# Patient Record
Sex: Female | Born: 1935
Health system: Southern US, Community
[De-identification: ages and names within clinical notes are randomized; demographics above are authoritative.]

## PROBLEM LIST (undated history)

## (undated) DIAGNOSIS — G20A1 Parkinson's disease without dyskinesia, without mention of fluctuations: Secondary | ICD-10-CM

## (undated) DIAGNOSIS — E785 Hyperlipidemia, unspecified: Secondary | ICD-10-CM

## (undated) DIAGNOSIS — H9190 Unspecified hearing loss, unspecified ear: Secondary | ICD-10-CM

## (undated) DIAGNOSIS — C801 Malignant (primary) neoplasm, unspecified: Secondary | ICD-10-CM

## (undated) DIAGNOSIS — I10 Essential (primary) hypertension: Secondary | ICD-10-CM

## (undated) DIAGNOSIS — A0472 Enterocolitis due to Clostridium difficile, not specified as recurrent: Secondary | ICD-10-CM

## (undated) DIAGNOSIS — E039 Hypothyroidism, unspecified: Secondary | ICD-10-CM

## (undated) DIAGNOSIS — Z860100 Personal history of colon polyps, unspecified: Secondary | ICD-10-CM

## (undated) DIAGNOSIS — K449 Diaphragmatic hernia without obstruction or gangrene: Secondary | ICD-10-CM

## (undated) DIAGNOSIS — K8020 Calculus of gallbladder without cholecystitis without obstruction: Principal | ICD-10-CM

## (undated) DIAGNOSIS — F329 Major depressive disorder, single episode, unspecified: Secondary | ICD-10-CM

## (undated) DIAGNOSIS — Z9289 Personal history of other medical treatment: Secondary | ICD-10-CM

## (undated) DIAGNOSIS — I4891 Unspecified atrial fibrillation: Secondary | ICD-10-CM

## (undated) DIAGNOSIS — K219 Gastro-esophageal reflux disease without esophagitis: Secondary | ICD-10-CM

## (undated) DIAGNOSIS — F419 Anxiety disorder, unspecified: Secondary | ICD-10-CM

## (undated) DIAGNOSIS — G20 Parkinson's disease: Secondary | ICD-10-CM

## (undated) DIAGNOSIS — D649 Anemia, unspecified: Secondary | ICD-10-CM

## (undated) HISTORY — DX: Personal history of colon polyps, unspecified: Z86.0100

## (undated) HISTORY — DX: Personal history of colonic polyps: Z86.010

## (undated) HISTORY — DX: Major depressive disorder, single episode, unspecified: F32.9

## (undated) HISTORY — DX: Anxiety disorder, unspecified: F41.9

## (undated) HISTORY — DX: Diaphragmatic hernia without obstruction or gangrene: K44.9

## (undated) HISTORY — DX: Hyperlipidemia, unspecified: E78.5

## (undated) HISTORY — DX: Gastro-esophageal reflux disease without esophagitis: K21.9

## (undated) HISTORY — DX: Hypothyroidism, unspecified: E03.9

## (undated) HISTORY — DX: Enterocolitis due to Clostridium difficile, not specified as recurrent: A04.72

## (undated) HISTORY — DX: Essential (primary) hypertension: I10

## (undated) HISTORY — DX: Parkinson's disease without dyskinesia, without mention of fluctuations: G20.A1

## (undated) HISTORY — DX: Anemia, unspecified: D64.9

## (undated) HISTORY — DX: Calculus of gallbladder without cholecystitis without obstruction: K80.20

## (undated) HISTORY — PX: EYE SURGERY: SHX253

## (undated) HISTORY — DX: Unspecified atrial fibrillation: I48.91

## (undated) HISTORY — PX: COLON SURGERY: SHX602

## (undated) HISTORY — DX: Parkinson's disease: G20

---
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
## 1957-03-18 HISTORY — PX: BACK SURGERY: SHX140

## 1967-03-19 HISTORY — PX: ABDOMINAL HYSTERECTOMY: SHX81

## 1993-03-18 HISTORY — PX: BREAST SURGERY: SHX581

## 1997-12-20 ENCOUNTER — Other Ambulatory Visit: Admission: RE | Admit: 1997-12-20 | Discharge: 1997-12-20 | Payer: Self-pay | Admitting: Family Medicine

## 1998-12-22 ENCOUNTER — Other Ambulatory Visit: Admission: RE | Admit: 1998-12-22 | Discharge: 1998-12-22 | Payer: Self-pay | Admitting: Family Medicine

## 1999-01-08 ENCOUNTER — Encounter: Payer: Self-pay | Admitting: Internal Medicine

## 1999-03-19 DIAGNOSIS — F32A Depression, unspecified: Secondary | ICD-10-CM

## 1999-03-19 HISTORY — DX: Depression, unspecified: F32.A

## 1999-05-10 ENCOUNTER — Other Ambulatory Visit: Admission: RE | Admit: 1999-05-10 | Discharge: 1999-05-10 | Payer: Self-pay | Admitting: Internal Medicine

## 1999-05-10 ENCOUNTER — Encounter (INDEPENDENT_AMBULATORY_CARE_PROVIDER_SITE_OTHER): Payer: Self-pay | Admitting: Specialist

## 1999-12-07 ENCOUNTER — Encounter: Admission: RE | Admit: 1999-12-07 | Discharge: 1999-12-07 | Payer: Self-pay | Admitting: Family Medicine

## 1999-12-07 ENCOUNTER — Encounter: Payer: Self-pay | Admitting: Family Medicine

## 2000-01-23 ENCOUNTER — Encounter: Admission: RE | Admit: 2000-01-23 | Discharge: 2000-01-23 | Payer: Self-pay | Admitting: Family Medicine

## 2000-01-23 ENCOUNTER — Encounter (INDEPENDENT_AMBULATORY_CARE_PROVIDER_SITE_OTHER): Payer: Self-pay | Admitting: *Deleted

## 2000-01-23 ENCOUNTER — Encounter: Payer: Self-pay | Admitting: Family Medicine

## 2000-02-05 ENCOUNTER — Other Ambulatory Visit: Admission: RE | Admit: 2000-02-05 | Discharge: 2000-02-05 | Payer: Self-pay | Admitting: Family Medicine

## 2000-12-08 ENCOUNTER — Encounter: Payer: Self-pay | Admitting: Family Medicine

## 2000-12-08 ENCOUNTER — Encounter: Admission: RE | Admit: 2000-12-08 | Discharge: 2000-12-08 | Payer: Self-pay | Admitting: Family Medicine

## 2000-12-12 ENCOUNTER — Encounter: Payer: Self-pay | Admitting: Family Medicine

## 2000-12-12 ENCOUNTER — Encounter: Admission: RE | Admit: 2000-12-12 | Discharge: 2000-12-12 | Payer: Self-pay | Admitting: Family Medicine

## 2001-02-05 ENCOUNTER — Other Ambulatory Visit: Admission: RE | Admit: 2001-02-05 | Discharge: 2001-02-05 | Payer: Self-pay | Admitting: Family Medicine

## 2001-03-02 ENCOUNTER — Encounter: Admission: RE | Admit: 2001-03-02 | Discharge: 2001-03-02 | Payer: Self-pay | Admitting: Urology

## 2001-03-02 ENCOUNTER — Encounter: Payer: Self-pay | Admitting: Urology

## 2001-03-04 ENCOUNTER — Ambulatory Visit (HOSPITAL_BASED_OUTPATIENT_CLINIC_OR_DEPARTMENT_OTHER): Admission: RE | Admit: 2001-03-04 | Discharge: 2001-03-04 | Payer: Self-pay | Admitting: Urology

## 2001-03-25 ENCOUNTER — Encounter (INDEPENDENT_AMBULATORY_CARE_PROVIDER_SITE_OTHER): Payer: Self-pay | Admitting: Specialist

## 2001-03-25 ENCOUNTER — Ambulatory Visit (HOSPITAL_BASED_OUTPATIENT_CLINIC_OR_DEPARTMENT_OTHER): Admission: RE | Admit: 2001-03-25 | Discharge: 2001-03-25 | Payer: Self-pay | Admitting: Urology

## 2001-06-25 DIAGNOSIS — Z86010 Personal history of colonic polyps: Secondary | ICD-10-CM

## 2001-06-25 DIAGNOSIS — Z860101 Personal history of adenomatous and serrated colon polyps: Secondary | ICD-10-CM

## 2001-06-25 HISTORY — DX: Personal history of colonic polyps: Z86.010

## 2001-06-25 HISTORY — DX: Personal history of adenomatous and serrated colon polyps: Z86.0101

## 2001-12-14 ENCOUNTER — Encounter: Admission: RE | Admit: 2001-12-14 | Discharge: 2001-12-14 | Payer: Self-pay | Admitting: Family Medicine

## 2001-12-14 ENCOUNTER — Encounter: Payer: Self-pay | Admitting: Family Medicine

## 2002-02-09 ENCOUNTER — Other Ambulatory Visit: Admission: RE | Admit: 2002-02-09 | Discharge: 2002-02-09 | Payer: Self-pay | Admitting: Family Medicine

## 2002-12-08 ENCOUNTER — Ambulatory Visit (HOSPITAL_COMMUNITY): Admission: RE | Admit: 2002-12-08 | Discharge: 2002-12-08 | Payer: Self-pay | Admitting: Neurology

## 2002-12-20 ENCOUNTER — Encounter: Payer: Self-pay | Admitting: Family Medicine

## 2002-12-20 ENCOUNTER — Encounter: Admission: RE | Admit: 2002-12-20 | Discharge: 2002-12-20 | Payer: Self-pay | Admitting: Family Medicine

## 2003-02-09 ENCOUNTER — Encounter: Admission: RE | Admit: 2003-02-09 | Discharge: 2003-02-09 | Payer: Self-pay | Admitting: Family Medicine

## 2003-02-28 ENCOUNTER — Encounter: Admission: RE | Admit: 2003-02-28 | Discharge: 2003-02-28 | Payer: Self-pay | Admitting: Family Medicine

## 2003-03-10 ENCOUNTER — Emergency Department (HOSPITAL_COMMUNITY): Admission: EM | Admit: 2003-03-10 | Discharge: 2003-03-10 | Payer: Self-pay | Admitting: Emergency Medicine

## 2003-03-19 HISTORY — PX: MASTECTOMY, PARTIAL: SHX709

## 2003-07-20 ENCOUNTER — Other Ambulatory Visit: Admission: RE | Admit: 2003-07-20 | Discharge: 2003-07-20 | Payer: Self-pay | Admitting: Family Medicine

## 2004-02-24 ENCOUNTER — Encounter: Admission: RE | Admit: 2004-02-24 | Discharge: 2004-02-24 | Payer: Self-pay | Admitting: Family Medicine

## 2004-08-02 ENCOUNTER — Other Ambulatory Visit: Admission: RE | Admit: 2004-08-02 | Discharge: 2004-08-02 | Payer: Self-pay | Admitting: Family Medicine

## 2005-02-24 ENCOUNTER — Inpatient Hospital Stay (HOSPITAL_COMMUNITY): Admission: EM | Admit: 2005-02-24 | Discharge: 2005-02-28 | Payer: Self-pay | Admitting: Emergency Medicine

## 2005-02-24 ENCOUNTER — Encounter (INDEPENDENT_AMBULATORY_CARE_PROVIDER_SITE_OTHER): Payer: Self-pay | Admitting: *Deleted

## 2005-02-25 ENCOUNTER — Ambulatory Visit: Payer: Self-pay | Admitting: Gastroenterology

## 2005-03-11 ENCOUNTER — Encounter (INDEPENDENT_AMBULATORY_CARE_PROVIDER_SITE_OTHER): Payer: Self-pay | Admitting: *Deleted

## 2005-03-19 ENCOUNTER — Ambulatory Visit: Payer: Self-pay | Admitting: Internal Medicine

## 2005-04-01 ENCOUNTER — Ambulatory Visit: Payer: Self-pay | Admitting: Internal Medicine

## 2005-04-02 ENCOUNTER — Encounter: Payer: Self-pay | Admitting: Internal Medicine

## 2005-04-03 ENCOUNTER — Encounter: Admission: RE | Admit: 2005-04-03 | Discharge: 2005-04-03 | Payer: Self-pay | Admitting: Family Medicine

## 2005-04-10 ENCOUNTER — Encounter (INDEPENDENT_AMBULATORY_CARE_PROVIDER_SITE_OTHER): Payer: Self-pay | Admitting: Specialist

## 2005-04-10 ENCOUNTER — Encounter (INDEPENDENT_AMBULATORY_CARE_PROVIDER_SITE_OTHER): Payer: Self-pay | Admitting: Diagnostic Radiology

## 2005-04-10 ENCOUNTER — Encounter: Admission: RE | Admit: 2005-04-10 | Discharge: 2005-04-10 | Payer: Self-pay | Admitting: Family Medicine

## 2005-04-26 ENCOUNTER — Encounter: Admission: RE | Admit: 2005-04-26 | Discharge: 2005-04-26 | Payer: Self-pay | Admitting: General Surgery

## 2005-05-08 ENCOUNTER — Encounter (INDEPENDENT_AMBULATORY_CARE_PROVIDER_SITE_OTHER): Payer: Self-pay | Admitting: *Deleted

## 2005-05-08 ENCOUNTER — Ambulatory Visit (HOSPITAL_COMMUNITY): Admission: RE | Admit: 2005-05-08 | Discharge: 2005-05-09 | Payer: Self-pay | Admitting: General Surgery

## 2005-05-16 ENCOUNTER — Ambulatory Visit: Payer: Self-pay | Admitting: Oncology

## 2005-06-21 ENCOUNTER — Encounter: Admission: RE | Admit: 2005-06-21 | Discharge: 2005-06-21 | Payer: Self-pay | Admitting: Oncology

## 2005-06-28 ENCOUNTER — Encounter: Admission: RE | Admit: 2005-06-28 | Discharge: 2005-06-28 | Payer: Self-pay | Admitting: Family Medicine

## 2005-08-28 ENCOUNTER — Ambulatory Visit: Payer: Self-pay | Admitting: Oncology

## 2006-02-26 ENCOUNTER — Ambulatory Visit: Payer: Self-pay | Admitting: Oncology

## 2006-02-28 LAB — CBC WITH DIFFERENTIAL/PLATELET
Basophils Absolute: 0 10*3/uL (ref 0.0–0.1)
EOS%: 0.8 % (ref 0.0–7.0)
Eosinophils Absolute: 0 10*3/uL (ref 0.0–0.5)
HCT: 38.4 % (ref 34.8–46.6)
HGB: 13 g/dL (ref 11.6–15.9)
MCH: 29.6 pg (ref 26.0–34.0)
MCV: 87.6 fL (ref 81.0–101.0)
NEUT%: 61.7 % (ref 39.6–76.8)
lymph#: 1.5 10*3/uL (ref 0.9–3.3)

## 2006-04-07 ENCOUNTER — Encounter: Admission: RE | Admit: 2006-04-07 | Discharge: 2006-04-07 | Payer: Self-pay | Admitting: Oncology

## 2006-05-26 IMAGING — CR DG CHEST 2V
2 series · 2 of 2 positions shown · non-contrast
Comparison: 03/10/03.

CLINICAL DATA: Preoperative respiratory exam for left breast cancer.  Hypertension.
 CHEST ? 2 VIEW:

[view not recorded (1 of 2)]
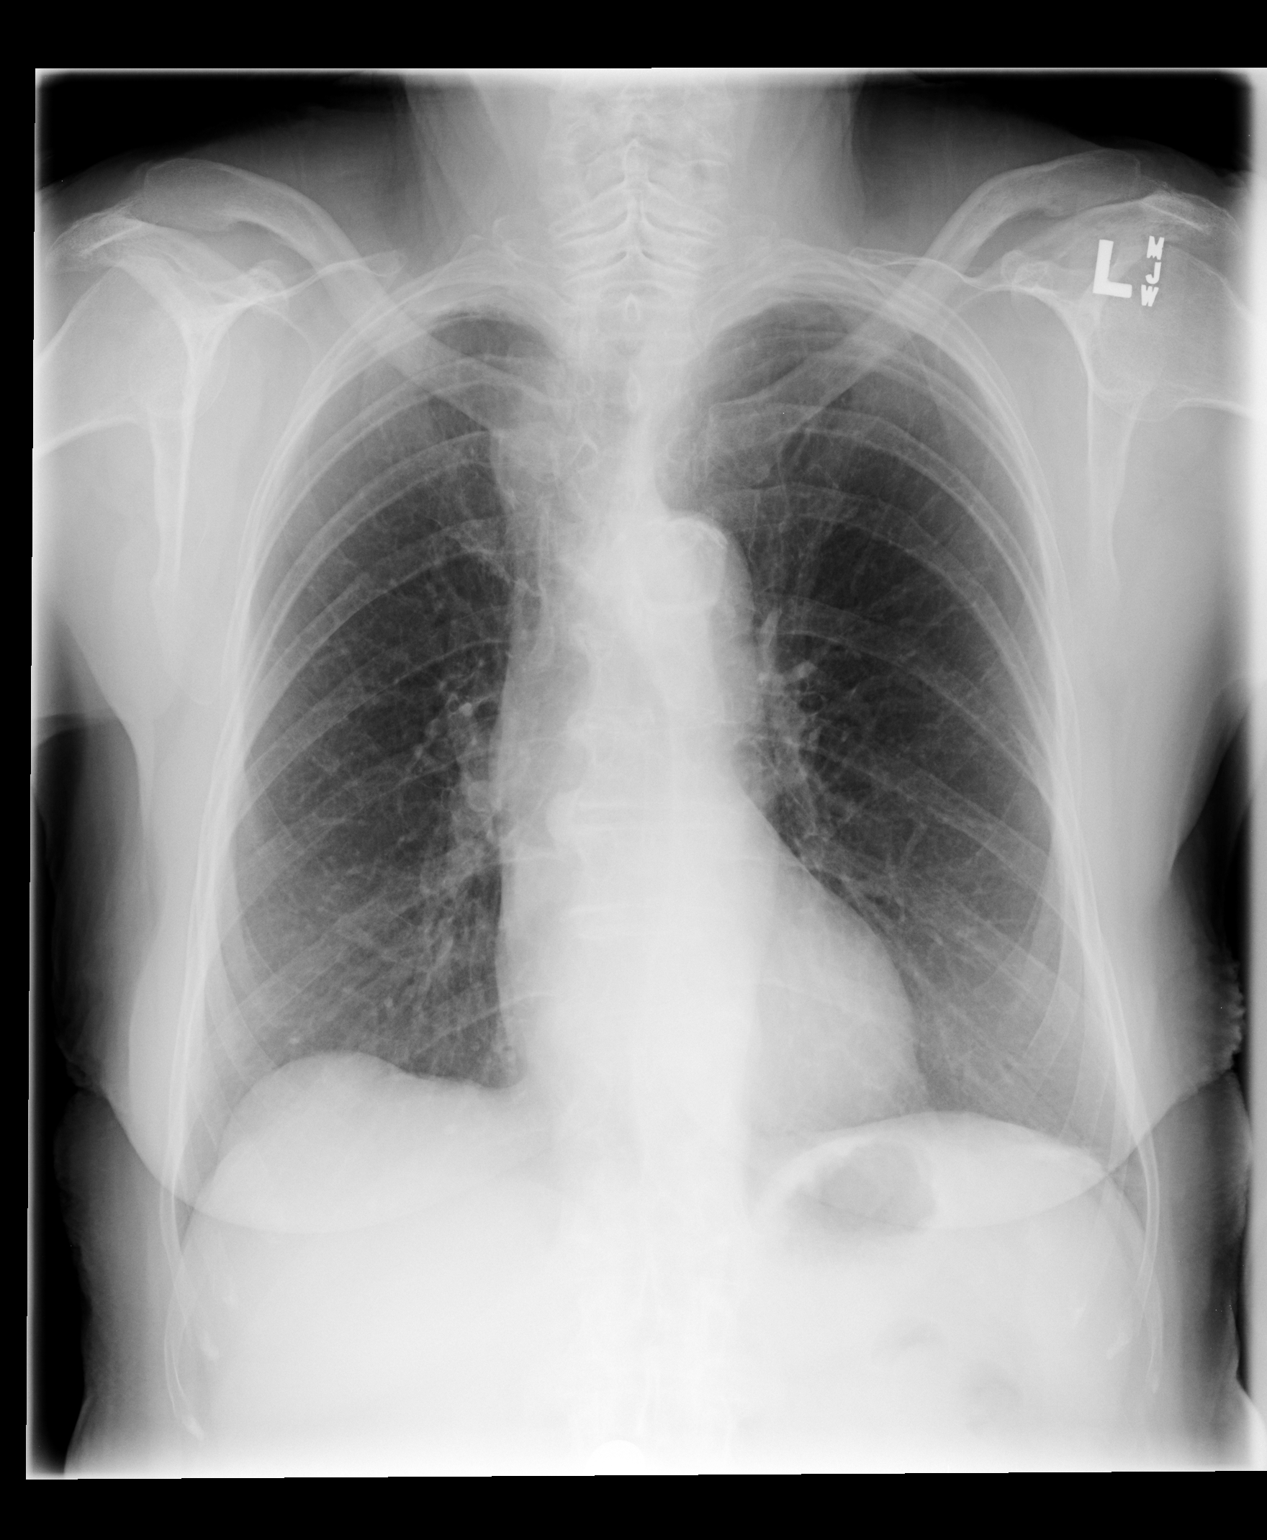

[view not recorded (2 of 2)]
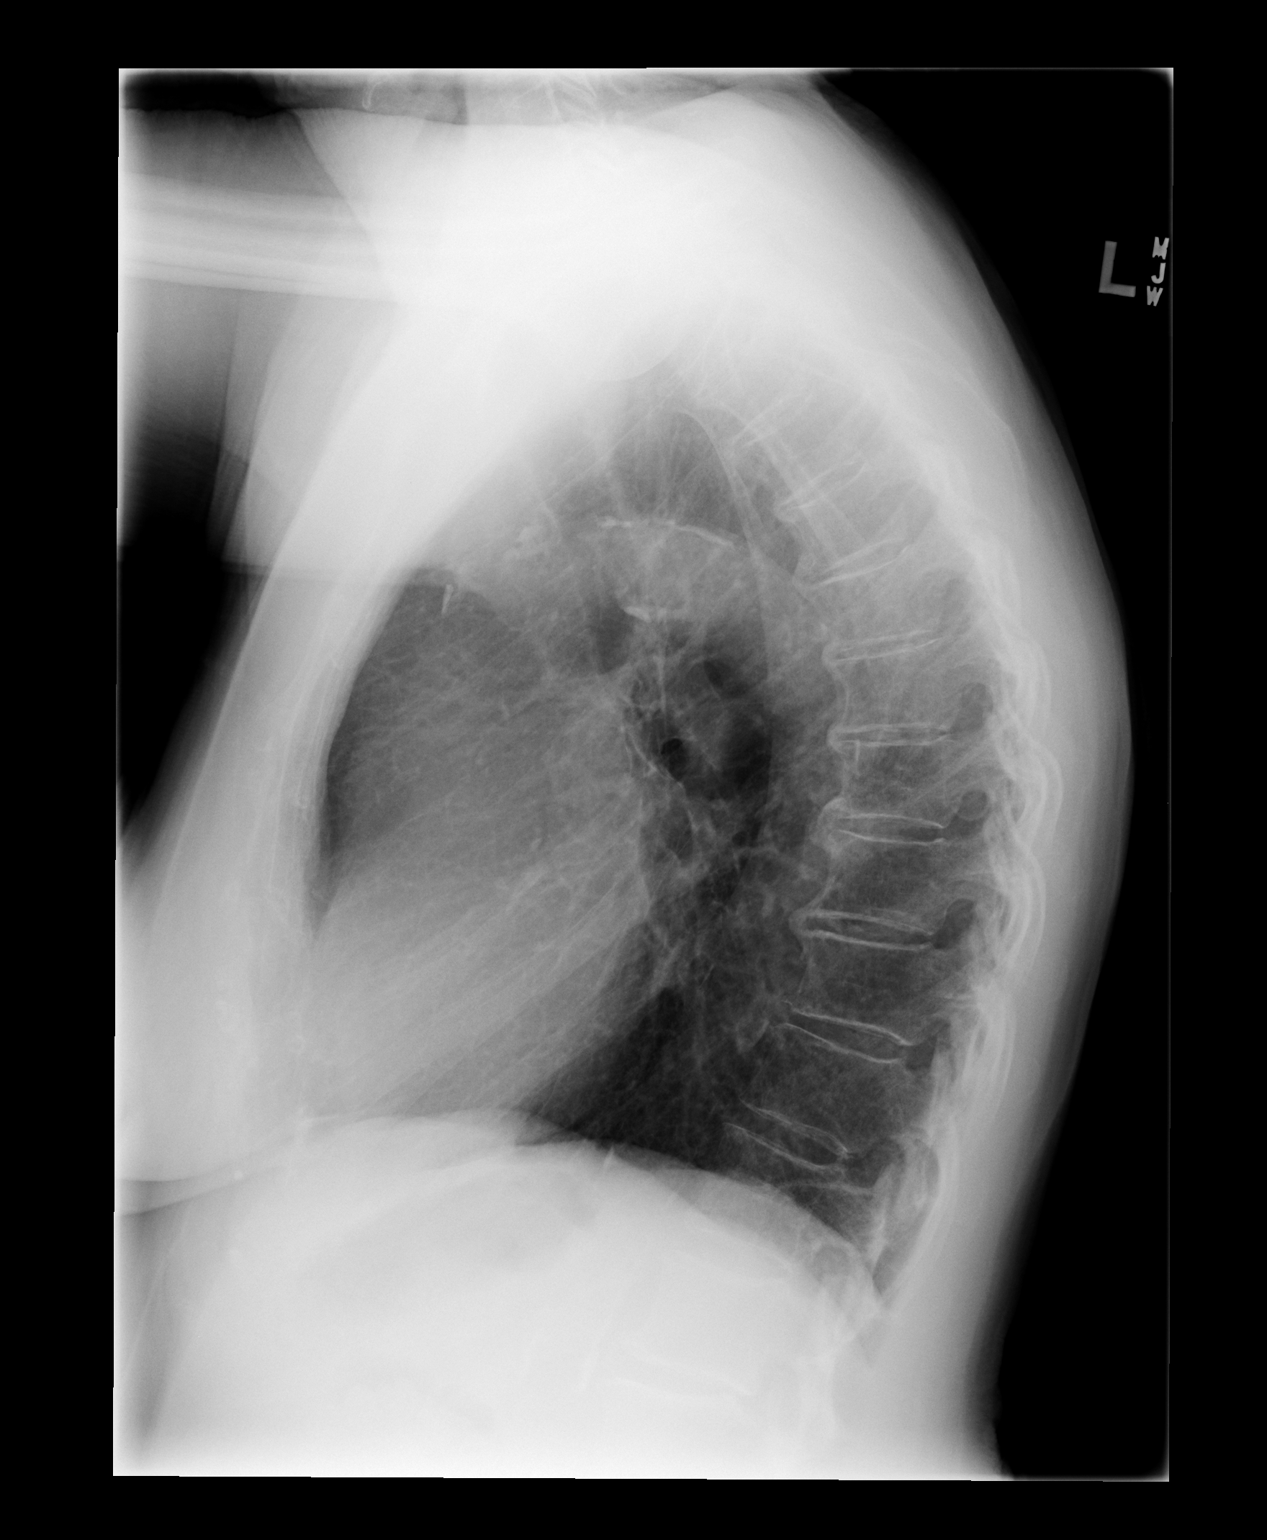

[2 of 2 positions shown; findings below may reference images not displayed]

FINDINGS: Heart size is normal.  The thoracic aorta is calcified and unfolded.  There is prominence in the right paratracheal region, which appears the same as it did in 5559.  This could be due to goiter or unfolded vessels.  The lung parenchyma is clear.  No effusions.  Ordinary degenerative changes affect the spine.
IMPRESSION: No change.  No active disease.

## 2006-07-28 ENCOUNTER — Encounter: Admission: RE | Admit: 2006-07-28 | Discharge: 2006-07-28 | Payer: Self-pay | Admitting: Endocrinology

## 2006-08-05 ENCOUNTER — Encounter: Admission: RE | Admit: 2006-08-05 | Discharge: 2006-08-05 | Payer: Self-pay | Admitting: Endocrinology

## 2006-08-05 ENCOUNTER — Encounter: Payer: Self-pay | Admitting: Internal Medicine

## 2006-08-20 ENCOUNTER — Ambulatory Visit: Payer: Self-pay | Admitting: Oncology

## 2006-08-22 LAB — CBC WITH DIFFERENTIAL/PLATELET
Basophils Absolute: 0 10*3/uL (ref 0.0–0.1)
EOS%: 0.7 % (ref 0.0–7.0)
HGB: 14 g/dL (ref 11.6–15.9)
LYMPH%: 16.6 % (ref 14.0–48.0)
MCH: 30.9 pg (ref 26.0–34.0)
MCV: 88.9 fL (ref 81.0–101.0)
MONO%: 11.4 % (ref 0.0–13.0)
NEUT%: 70.8 % (ref 39.6–76.8)
Platelets: 142 10*3/uL — ABNORMAL LOW (ref 145–400)
RDW: 14.2 % (ref 11.3–14.5)

## 2006-08-22 LAB — COMPREHENSIVE METABOLIC PANEL
AST: 22 U/L (ref 0–37)
Alkaline Phosphatase: 105 U/L (ref 39–117)
BUN: 27 mg/dL — ABNORMAL HIGH (ref 6–23)
Creatinine, Ser: 1.09 mg/dL (ref 0.40–1.20)
Total Bilirubin: 0.6 mg/dL (ref 0.3–1.2)

## 2006-09-01 ENCOUNTER — Encounter: Payer: Self-pay | Admitting: Internal Medicine

## 2006-11-05 ENCOUNTER — Encounter: Admission: RE | Admit: 2006-11-05 | Discharge: 2006-11-05 | Payer: Self-pay | Admitting: Family Medicine

## 2007-04-23 ENCOUNTER — Encounter: Admission: RE | Admit: 2007-04-23 | Discharge: 2007-04-23 | Payer: Self-pay | Admitting: Family Medicine

## 2007-05-29 ENCOUNTER — Inpatient Hospital Stay (HOSPITAL_COMMUNITY): Admission: EM | Admit: 2007-05-29 | Discharge: 2007-05-31 | Payer: Self-pay | Admitting: Emergency Medicine

## 2007-09-03 ENCOUNTER — Ambulatory Visit: Payer: Self-pay | Admitting: Oncology

## 2007-09-03 LAB — COMPREHENSIVE METABOLIC PANEL
AST: 16 U/L (ref 0–37)
Albumin: 3.8 g/dL (ref 3.5–5.2)
Alkaline Phosphatase: 62 U/L (ref 39–117)
BUN: 15 mg/dL (ref 6–23)
Creatinine, Ser: 0.93 mg/dL (ref 0.40–1.20)
Glucose, Bld: 102 mg/dL — ABNORMAL HIGH (ref 70–99)
Potassium: 4.2 mEq/L (ref 3.5–5.3)
Total Bilirubin: 0.6 mg/dL (ref 0.3–1.2)

## 2007-09-03 LAB — CBC WITH DIFFERENTIAL/PLATELET
BASO%: 0 % (ref 0.0–2.0)
Basophils Absolute: 0 10*3/uL (ref 0.0–0.1)
EOS%: 0.6 % (ref 0.0–7.0)
HCT: 37.5 % (ref 34.8–46.6)
HGB: 13 g/dL (ref 11.6–15.9)
LYMPH%: 19.1 % (ref 14.0–48.0)
MCH: 31.5 pg (ref 26.0–34.0)
MCHC: 34.5 g/dL (ref 32.0–36.0)
MCV: 91.2 fL (ref 81.0–101.0)
MONO%: 10.6 % (ref 0.0–13.0)
NEUT%: 69.7 % (ref 39.6–76.8)
Platelets: 119 10*3/uL — ABNORMAL LOW (ref 145–400)

## 2008-03-18 LAB — HM DIABETES EYE EXAM: HM Diabetic Eye Exam: NORMAL

## 2008-05-06 ENCOUNTER — Encounter: Admission: RE | Admit: 2008-05-06 | Discharge: 2008-05-06 | Payer: Self-pay | Admitting: Oncology

## 2008-09-06 ENCOUNTER — Encounter: Admission: RE | Admit: 2008-09-06 | Discharge: 2008-09-06 | Payer: Self-pay | Admitting: Internal Medicine

## 2008-09-24 ENCOUNTER — Inpatient Hospital Stay (HOSPITAL_COMMUNITY): Admission: EM | Admit: 2008-09-24 | Discharge: 2008-09-28 | Payer: Self-pay | Admitting: *Deleted

## 2008-09-26 ENCOUNTER — Ambulatory Visit: Payer: Self-pay | Admitting: Internal Medicine

## 2008-09-28 ENCOUNTER — Inpatient Hospital Stay: Admission: RE | Admit: 2008-09-28 | Discharge: 2008-10-18 | Payer: Self-pay | Admitting: Internal Medicine

## 2009-01-03 ENCOUNTER — Ambulatory Visit: Payer: Self-pay | Admitting: Oncology

## 2009-01-05 LAB — CBC WITH DIFFERENTIAL/PLATELET
BASO%: 0.3 % (ref 0.0–2.0)
EOS%: 0.5 % (ref 0.0–7.0)
HCT: 36.8 % (ref 34.8–46.6)
LYMPH%: 23.5 % (ref 14.0–49.7)
MCH: 30.4 pg (ref 25.1–34.0)
MCHC: 32.3 g/dL (ref 31.5–36.0)
MONO#: 0.6 10*3/uL (ref 0.1–0.9)
NEUT%: 67.1 % (ref 38.4–76.8)
Platelets: 121 10*3/uL — ABNORMAL LOW (ref 145–400)
RBC: 3.91 10*6/uL (ref 3.70–5.45)
WBC: 6.5 10*3/uL (ref 3.9–10.3)
lymph#: 1.5 10*3/uL (ref 0.9–3.3)

## 2009-01-05 LAB — COMPREHENSIVE METABOLIC PANEL
ALT: 9 U/L (ref 0–35)
AST: 15 U/L (ref 0–37)
CO2: 26 mEq/L (ref 19–32)
Creatinine, Ser: 0.95 mg/dL (ref 0.40–1.20)
Sodium: 142 mEq/L (ref 135–145)
Total Bilirubin: 0.5 mg/dL (ref 0.3–1.2)
Total Protein: 6.5 g/dL (ref 6.0–8.3)

## 2009-01-05 LAB — CANCER ANTIGEN 27.29: CA 27.29: 19 U/mL (ref 0–39)

## 2009-04-19 ENCOUNTER — Encounter: Admission: RE | Admit: 2009-04-19 | Discharge: 2009-04-19 | Payer: Self-pay | Admitting: Internal Medicine

## 2009-04-28 ENCOUNTER — Encounter: Payer: Self-pay | Admitting: Internal Medicine

## 2009-04-28 LAB — CONVERTED CEMR LAB
BUN: 19 mg/dL
Glucose, Bld: 170 mg/dL
Hgb A1c MFr Bld: 5.9 %
Monocytes Relative: 10.2 %
Neutrophils Relative %: 56.4 %
RDW: 13.9 %
TSH: 1.3 microintl units/mL
TSH: 1.34 microintl units/mL
WBC: 4.4 10*3/uL

## 2009-05-08 ENCOUNTER — Encounter: Admission: RE | Admit: 2009-05-08 | Discharge: 2009-05-08 | Payer: Self-pay | Admitting: Oncology

## 2009-05-08 LAB — HM MAMMOGRAPHY

## 2009-10-13 ENCOUNTER — Encounter: Payer: Self-pay | Admitting: Internal Medicine

## 2009-10-13 LAB — CONVERTED CEMR LAB
HCT: 37.7 %
Neutrophils Relative %: 60.7 %
RBC: 4.04 M/uL
RDW: 13.4 %
TSH: 4.4 microintl units/mL
WBC: 6.7 10*3/uL

## 2009-11-18 ENCOUNTER — Encounter: Payer: Self-pay | Admitting: Emergency Medicine

## 2009-11-19 ENCOUNTER — Inpatient Hospital Stay (HOSPITAL_COMMUNITY): Admission: EM | Admit: 2009-11-19 | Discharge: 2009-11-24 | Payer: Self-pay | Admitting: Emergency Medicine

## 2009-11-20 ENCOUNTER — Encounter: Payer: Self-pay | Admitting: Internal Medicine

## 2009-11-24 ENCOUNTER — Encounter (INDEPENDENT_AMBULATORY_CARE_PROVIDER_SITE_OTHER): Payer: Self-pay | Admitting: *Deleted

## 2010-01-03 ENCOUNTER — Ambulatory Visit: Payer: Self-pay | Admitting: Oncology

## 2010-01-05 ENCOUNTER — Encounter: Payer: Self-pay | Admitting: Internal Medicine

## 2010-01-05 LAB — CBC WITH DIFFERENTIAL/PLATELET
BASO%: 0.5 % (ref 0.0–2.0)
Eosinophils Absolute: 0 10*3/uL (ref 0.0–0.5)
LYMPH%: 25.8 % (ref 14.0–49.7)
MONO#: 0.9 10*3/uL (ref 0.1–0.9)
NEUT#: 3.6 10*3/uL (ref 1.5–6.5)
Platelets: 106 10*3/uL — ABNORMAL LOW (ref 145–400)
RBC: 4.25 10*6/uL (ref 3.70–5.45)
WBC: 6.2 10*3/uL (ref 3.9–10.3)
lymph#: 1.6 10*3/uL (ref 0.9–3.3)
nRBC: 0 % (ref 0–0)

## 2010-01-05 LAB — TECHNOLOGIST REVIEW

## 2010-02-01 ENCOUNTER — Encounter: Payer: Self-pay | Admitting: Internal Medicine

## 2010-02-01 LAB — CONVERTED CEMR LAB
AST: 19 units/L
Albumin: 4.1 g/dL
Alkaline Phosphatase: 61 units/L
CO2: 26 meq/L
Calcium: 9.9 mg/dL
Creatinine, Ser: 1 mg/dL
Potassium: 4 meq/L
Sodium: 138 meq/L
TSH: 4.6 microintl units/mL

## 2010-02-20 ENCOUNTER — Encounter (INDEPENDENT_AMBULATORY_CARE_PROVIDER_SITE_OTHER): Payer: Self-pay | Admitting: *Deleted

## 2010-02-20 ENCOUNTER — Emergency Department (HOSPITAL_COMMUNITY)
Admission: EM | Admit: 2010-02-20 | Discharge: 2010-02-20 | Payer: Self-pay | Source: Home / Self Care | Admitting: Emergency Medicine

## 2010-02-20 ENCOUNTER — Encounter: Payer: Self-pay | Admitting: Internal Medicine

## 2010-02-20 LAB — CONVERTED CEMR LAB
ALT: 15 units/L
AST: 29 units/L
Alkaline Phosphatase: 59 units/L
BUN: 15 mg/dL
Basophils Relative: 0 %
Calcium: 9.3 mg/dL
Eosinophils Relative: 0.1 %
Glucose, Bld: 97 mg/dL
HCT: 38.9 %
Hemoglobin: 13 g/dL
Lymphocytes, automated: 32 %
MCV: 94.6 fL
Monocytes Relative: 13 %
Total Bilirubin: 0.4 mg/dL
Total Protein: 6.6 g/dL
WBC: 6.4 10*3/uL

## 2010-02-23 ENCOUNTER — Encounter: Payer: Self-pay | Admitting: Internal Medicine

## 2010-03-01 ENCOUNTER — Encounter: Payer: Self-pay | Admitting: Internal Medicine

## 2010-03-06 ENCOUNTER — Encounter: Payer: Self-pay | Admitting: Internal Medicine

## 2010-03-06 DIAGNOSIS — E039 Hypothyroidism, unspecified: Secondary | ICD-10-CM

## 2010-03-06 DIAGNOSIS — I10 Essential (primary) hypertension: Secondary | ICD-10-CM | POA: Insufficient documentation

## 2010-03-07 ENCOUNTER — Ambulatory Visit: Payer: Self-pay | Admitting: Internal Medicine

## 2010-03-07 DIAGNOSIS — D3A00 Benign carcinoid tumor of unspecified site: Secondary | ICD-10-CM

## 2010-03-07 DIAGNOSIS — K5730 Diverticulosis of large intestine without perforation or abscess without bleeding: Secondary | ICD-10-CM | POA: Insufficient documentation

## 2010-03-07 DIAGNOSIS — Z87448 Personal history of other diseases of urinary system: Secondary | ICD-10-CM | POA: Insufficient documentation

## 2010-03-07 DIAGNOSIS — C50919 Malignant neoplasm of unspecified site of unspecified female breast: Secondary | ICD-10-CM

## 2010-03-07 DIAGNOSIS — R1084 Generalized abdominal pain: Secondary | ICD-10-CM

## 2010-03-07 DIAGNOSIS — K219 Gastro-esophageal reflux disease without esophagitis: Secondary | ICD-10-CM

## 2010-03-07 DIAGNOSIS — E785 Hyperlipidemia, unspecified: Secondary | ICD-10-CM

## 2010-03-15 ENCOUNTER — Encounter: Payer: Self-pay | Admitting: Internal Medicine

## 2010-03-15 ENCOUNTER — Telehealth: Payer: Self-pay | Admitting: Internal Medicine

## 2010-03-15 DIAGNOSIS — N390 Urinary tract infection, site not specified: Secondary | ICD-10-CM | POA: Insufficient documentation

## 2010-03-16 ENCOUNTER — Telehealth: Payer: Self-pay | Admitting: Internal Medicine

## 2010-03-16 ENCOUNTER — Ambulatory Visit: Admit: 2010-03-16 | Payer: Self-pay | Admitting: Internal Medicine

## 2010-03-16 ENCOUNTER — Encounter: Payer: Self-pay | Admitting: Internal Medicine

## 2010-03-16 ENCOUNTER — Ambulatory Visit
Admission: RE | Admit: 2010-03-16 | Discharge: 2010-03-16 | Payer: Self-pay | Source: Home / Self Care | Attending: Internal Medicine | Admitting: Internal Medicine

## 2010-03-30 ENCOUNTER — Ambulatory Visit
Admission: RE | Admit: 2010-03-30 | Discharge: 2010-03-30 | Payer: Self-pay | Source: Home / Self Care | Attending: Internal Medicine | Admitting: Internal Medicine

## 2010-03-30 LAB — CONVERTED CEMR LAB
Glucose, Urine, Semiquant: NEGATIVE
Ketones, urine, test strip: NEGATIVE
Protein, U semiquant: >=300

## 2010-04-01 DIAGNOSIS — N318 Other neuromuscular dysfunction of bladder: Secondary | ICD-10-CM | POA: Insufficient documentation

## 2010-04-02 ENCOUNTER — Telehealth: Payer: Self-pay | Admitting: Internal Medicine

## 2010-04-03 ENCOUNTER — Encounter (INDEPENDENT_AMBULATORY_CARE_PROVIDER_SITE_OTHER): Payer: Self-pay | Admitting: *Deleted

## 2010-04-05 ENCOUNTER — Encounter: Payer: Self-pay | Admitting: Internal Medicine

## 2010-04-07 ENCOUNTER — Encounter: Payer: Self-pay | Admitting: Family Medicine

## 2010-04-18 ENCOUNTER — Encounter: Payer: Self-pay | Admitting: Internal Medicine

## 2010-04-19 NOTE — Letter (Addendum)
Summary: Primary Care Consult Scheduled Letter  [facility]  [address] [place], [place] [address]   Phone: [phone]  Fax: [phone]      04/03/2010 MRN: [id number]  Katelyn [name] [address] [place], [place]  [address]    Dear [name],      We have scheduled an appointment for you.  At the recommendation of Dr.Leschber, we have scheduled you a consult with [facility] (Dr. Vonita Moss on Jan 26,2012 at 1:15 pm .Their address is [address] [place] The office phone number is [phone] If this appointment day and time is not convenient for you, please feel free to call the office of the doctor you are being referred to at the number listed above and reschedule the appointment.     It is important for you to keep your scheduled appointments. We are here to make sure you are given good patient care. If you have questions or you have made changes to your appointment, please notify us at  [phone]        , ask for  Debra            .    Thank you,  Patient Care Coordinator [facility]

## 2010-04-19 NOTE — Letter (Addendum)
Summary: [facility]   Imported By: Sherian Rein 04/04/2010 11:35:40  _____________________________________________________________________  External Attachment:    Type:   Image     Comment:   External Document

## 2010-04-19 NOTE — Assessment & Plan Note (Signed)
Summary: New/ medicare/#/cd   Vital Signs:  Patient profile:   77 year old female Height:      62 inches (157.48 cm) Weight:      141.0 pounds (64.09 kg) BMI:     25.88 O2 Sat:      96 % on Room air Temp:     98.8 degrees F (37.11 degrees C) oral Pulse rate:   71 / minute BP sitting:   122 / 72  (left arm) Cuff size:   regular  Vitals Entered By: Orlan Leavens RMA (March 07, 2010 9:41 AM)  O2 Flow:  Room air CC: New patient Is Patient Diabetic? No Pain Assessment Patient in pain? no        Primary Care Provider:  Newt Lukes MD  CC:  New patient.  History of Present Illness: new pt to me, here to est care - prev followed with dr. Su Hilt  c/o epigastric and BLQ "discomfort" -  specifically denies abd pain or cramping, describes as "sick feeling" no n/v - no fever, no weight changes episodes occur 4-5x/week - progressive and inc freq of attacks onset of symptoms usually 4-5am - causes pt to wake from sleep symptoms last "all day" assoc with diarrhea, improved with immodium "sick" feeling better with alprazolam, worse with hydrocodone recent ER eval and PCP eval for same - "labs all normal" and ongoing tx for UTI but not improved would lke to see GI for eval of same at rec of gen surg (?carcinoid syndrome - see next)  also reviewed chronic med issues - HTN - reports compliance with ongoing medical treatment and no changes in medication dose or frequency. denies adverse side effects related to current therapy. needs change toprol xr to two times a day for cost concerns  hypothyroid - has followed with endo for same but would like to come here - reports compliance with ongoing medical treatment but freq changes in medication dose or frequency. denies adverse side effects related to current therapy.   breast cancer hx - on tamoxifen and follows q6-81mo with onc for same  dyslipidemia - reports compliance with ongoing medical treatment and no changes in medication  dose or frequency. denies adverse side effects related to current therapy.   Preventive Screening-Counseling & Management  Alcohol-Tobacco     Alcohol drinks/day: 0     Alcohol Counseling: not indicated; patient does not drink     Smoking Status: never     Tobacco Counseling: not indicated; no tobacco use  Caffeine-Diet-Exercise     Does Patient Exercise: no     Exercise Counseling: not indicated; exercise is adequate  Safety-Violence-Falls     Seat Belt Counseling: not indicated; patient wears seat belts     Helmet Counseling: not applicable     Firearm Counseling: not applicable     Violence Counseling: not applicable     Fall Risk Counseling: not indicated; no significant falls noted  Clinical Review Panels:  Prevention   Last Mammogram:  [facility].   No specific mammographic evidence of malignancy.   (05/08/2009)   Last Colonoscopy:  Location:  [facility].  Results: Polyp.  (04/01/2005)  CBC   WBC:  6.4 (02/20/2010)   RBC:  4.11 (02/20/2010)   Hgb:  13.0 (02/20/2010)   Hct:  38.9 (02/20/2010)   Platelets:  133 (02/20/2010)   MCV  94.6 (02/20/2010)   RDW  13.6 (02/20/2010)   PMN:  54 (02/20/2010)   Monos:  13 (  02/20/2010)   Eosinophils:  0.1 (02/20/2010)   Basophil:  0 (02/20/2010)  Complete Metabolic Panel   Glucose:  97 (02/20/2010)   Sodium:  136 (02/20/2010)   Potassium:  4.5 (02/20/2010)   Chloride:  102 (02/20/2010)   CO2:  25 (02/20/2010)   BUN:  15 (02/20/2010)   Creatinine:  1.11 (02/20/2010)   Albumin:  3.5 (02/20/2010)   Total Protein:  6.6 (02/20/2010)   Calcium:  9.3 (02/20/2010)   Total Bili:  0.4 (02/20/2010)   Alk Phos:  59 (02/20/2010)   SGPT (ALT):  15 (02/20/2010)   SGOT (AST):  29 (02/20/2010)   Current Medications (verified): 1)  Metoprolol Tartrate 25 Mg Tabs (Metoprolol Tartrate) .... Take 1 By Mouth Once Daily 2)  Womens Multivitamin Plus  Tabs (Multiple Vitamins-Minerals) .... Take  1 By Mouth Once Daily 3)  Tamoxifen Citrate 20 Mg Tabs (Tamoxifen Citrate) .... Take 1 By Mouth Qd 4)  Amlodipine Besylate 5 Mg Tabs (Amlodipine Besylate) .... Take 1 By Mouth Once Daily 5)  Simvastatin 40 Mg Tabs (Simvastatin) .... Take 1 By Mouth Once Daily 6)  Synthroid 50 Mcg Tabs (Levothyroxine Sodium) .... Take 1 By Mouth Once Daily 7)  Alprazolam 0.25 Mg Tabs (Alprazolam) .... Take 1-2 By Mouth At Bedtime As Needed 8)  Hydrocodone-Acetaminophen 5-325 Mg Tabs (Hydrocodone-Acetaminophen) .... Take 1 Every 4-6 Hours As Needed 9)  Centrum Silver  Tabs (Multiple Vitamins-Minerals) .... Take 1 By Mouth Once Daily 10)  Omeprazole 40 Mg Cpdr (Omeprazole) .... Take 1 By Mouth Once Daily  Allergies (verified): No Known Drug Allergies  Past History:  Past Medical History: Hypertension Hypothyroidism Colonic polyps, hx    RLQ mesenteric mass - 1.4cmx2.1 cm CT 02/20/2010 - ?carcinoid GERD Hyperlipidemia Breast cancer,1995 on right - lumpectomy, xrt and tamoxifen        2003/08/12 on left - partial mastectomy with tamoxifen hx depression 1999/08/12 following death of spouse  MD roster: endo - [facility] - [facility] - [facility] - [facility] - [facility]  Past Surgical History: Hysterectomy 12-Aug-1967) Breast -08/11/1993 R lumpectomy + xrt and tamoxifen; 08-12-2003 L partial mastectomy w/ Tamoxifen Back surgery Aug 11, 1957)  Family History: Family History Breast cancer 1st degree relative <50 (parent, other relative) Family History of Colon CA 1st degree relative <60 (parent) Family History High cholesterol (parent) Family History Hypertension (parent)   Social History: Never Smoked no alcohol widowed, lives alone but supportive children (4 in town) has sitter/aide M-F to assist with chores, shopping/transport, compSmoking Status:  never Does Patient Exercise:  no  Review of Systems       see HPI above. I have reviewed all other systems and they were negative.   Physical Exam  General:  elderly  woman, alert, well-developed, well-nourished, and cooperative to examination.   mildly uncomfortable but NAD - dtr at side Head:  Normocephalic and atraumatic without obvious abnormalities. No apparent alopecia or balding. Eyes:  vision grossly intact; pupils equal, round and reactive to light.  conjunctiva and lids normal.    Ears:  wears hearing aides but HOH Mouth:  teeth and gums in good repair; mucous membranes moist, without lesions or ulcers. oropharynx clear without exudate, no erythema.  Neck:  supple, full ROM, no masses, no thyromegaly; no thyroid nodules or tenderness. no JVD or carotid bruits.   Lungs:  normal respiratory effort, no intercostal retractions or use of accessory muscles; normal breath sounds bilaterally - no crackles and no wheezes.    Heart:  normal  rate, regular rhythm, no murmur, and no rub. BLE without edema. Abdomen:  soft, non-tender, normal bowel sounds, no distention; no masses and no appreciable hepatomegaly or splenomegaly.   Msk:  No deformity or scoliosis noted of thoracic or lumbar spine.   Neurologic:  alert & oriented X3 and cranial nerves II-XII symetrically intact.  strength normal in all extremities, sensation intact to light touch, and gait normal. speech fluent without dysarthria or aphasia; follows commands with good comprehension.  Skin:  no rashes, vesicles, ulcers, or erythema. No nodules or irregularity to palpation.  Psych:  Oriented X3, memory intact for recent and remote, normally interactive, good eye contact, not anxious appearing, not depressed appearing, and not agitated.     Diabetes Management Exam:    Eye Exam:       Eye Exam done elsewhere          Date: 03/18/2008          Results: normal          Done by: Dr. Nile Riggs   Impression & Recommendations:  Problem # 1:  ABDOMINAL PAIN, GENERALIZED (ICD-789.07)  nonspecific exam - recent labs and CT unremarkable for cause of symptoms  noted RLQ mass, ? carcinoid tumor - slight  progressive growth since 2006 - not felt to be cause of symptoms per recent surg eval ([facility])  ongoing intermitt sick symptoms with diarrhea - will refer to GI for further eval and tx as needed - has seen [facility] in past stop hydrocodone and cont BZ as needed - ok for otc immodium as needed   Discussed symptom control with the patient.   Orders: Gastroenterology Referral (GI)  Problem # 2:  ADENOCARCINOMA, BREAST (ICD-174.9) 1995 - right, 2005 - left - on tamoxifen but completing 5year (repeat) soon - cont mgmt per onc as ongoing ([facility])  Problem # 3:  HYPERTENSION (ICD-401.9) med dose changes for lower cost at pt request The following medications were removed from the medication list:    Amlodipine Besylate 10 Mg Tabs (Amlodipine besylate) .Marland Kitchen... Take 1 by mouth once daily Her updated medication list for this problem includes:    Metoprolol Tartrate 25 Mg Tabs (Metoprolol tartrate) .Marland Kitchen... Take 1 by mouth two times a day    Amlodipine Besylate 5 Mg Tabs (Amlodipine besylate) .Marland Kitchen... Take 1 by mouth once daily  BP today: 122/72  Labs Reviewed: K+: 4.5 (02/20/2010) Creat: : 1.11 (02/20/2010)     Problem # 4:  HYPERLIPIDEMIA (ICD-272.4) send for prior records Her updated medication list for this problem includes:    Simvastatin 40 Mg Tabs (Simvastatin) .Marland Kitchen... Take 1 by mouth once daily  Labs Reviewed: SGOT: 29 (02/20/2010)   SGPT: 15 (02/20/2010)  Problem # 5:  HYPOTHYROIDISM (ICD-244.9)  send for prior records - will follow here at pt pref unless  endo input needed/desired Her updated medication list for this problem includes:    Synthroid 50 Mcg Tabs (Levothyroxine sodium) .Marland Kitchen... Take 1 by mouth once daily  Labs Reviewed: TSH: 2.075 (11/20/2009)    Time spent with patient and dtr 45 minutes, more than 50% of this time was spent counseling patient on abd pain symptoms and recent ER eval (review), surg eval - also chronic med issues, treatment and medications - need to obtain  further records and specilist input for ?carcinoid or other tumor in RLQ on CT 02/2010  Complete Medication List: 1)  Metoprolol Tartrate 25 Mg Tabs (Metoprolol tartrate) .... Take 1 by mouth two times a day 2)  Tamoxifen Citrate 20 Mg Tabs (Tamoxifen citrate) .... Take 1 by mouth once daily 3)  Amlodipine Besylate 5 Mg Tabs (Amlodipine besylate) .... Take 1 by mouth once daily 4)  Simvastatin 40 Mg Tabs (Simvastatin) .... Take 1 by mouth once daily 5)  Synthroid 50 Mcg Tabs (Levothyroxine sodium) .... Take 1 by mouth once daily 6)  Alprazolam 0.25 Mg Tabs (Alprazolam) .... Take 1-2 by mouth at bedtime as needed 7)  Centrum Silver Tabs (Multiple vitamins-minerals) .... Take 1 by mouth once daily 8)  Omeprazole 40 Mg Cpdr (Omeprazole) .... Take 1 by mouth once daily 9)  Amoxicillin 250 Mg Caps (Amoxicillin) .Marland Kitchen.. 1 by mouth three times a day until complete for uti 10)  Calcium Citrate-vitamin D 200-125 Mg-unit Tabs (Calcium citrate-vitamin d) .... 600mg  once daily  Patient Instructions: 1)  it was good to see you today. 2)  medications and medical hisgtory reviewed - 3)  will send for records from dr. Su Hilt, [facility] and [place] as discussed 4)  we'll make referral to Dr. Juanda Chance for your stomach symptoms. Our office will contact you regarding this appointment once made.  5)  ok to take immodium as needed - do not exceed 8 pills/24hours 6)  change metoprolol to two times a day dose for lower cost medications and generic thyroid when refills due 7)  stop vicodin and use alprazolam (xanax) as needed for anxiety and stomach symptoms as discussed 8)  Please schedule a follow-up appointment in 6 weeks to continue review, call sooner if problems.    Orders Added: 1)  Gastroenterology Referral [GI] 2)  New Patient Level IV [99204]     Colonoscopy  Procedure date:  04/01/2005  Findings:      Location:  [facility].  Results: Polyp.    Comments:      Repeat colonoscopy in 5  years.

## 2010-04-19 NOTE — Progress Notes (Signed)
Summary: UTI?VAL pt  Phone Note Call from Patient   Caller: Daughter Elease Hashimoto [phone] Summary of Call: Pt's daughter called stating pt has sxs of UTI; frequency, odor, discoloration, urgency and hesitancy. Daughter is requesting Rx to treat ASAP. Per duahgter pt tends to get very dehydrated and ends up being admitted to hospital when she has a UTI, please advise Initial call taken by: Margaret Pyle, CMA,  March 15, 2010 4:34 PM  Follow-up for Phone Call        ok this time only; but should consider OV with Dr Felicity Coyer tomorrow (or ER tonight) if having fever, chills, n/v, worsening abd pain, back pain, weakness, dizziness, falls  can pt come by tomorrow for urine cx - if so, ok  599.1 Follow-up by: Corwin Levins MD,  March 15, 2010 4:50 PM  Additional Follow-up for Phone Call Additional follow up Details #1::        Pt's daughter advised and UCx scheduled Additional Follow-up by: Margaret Pyle, CMA,  March 15, 2010 4:59 PM  New Problems: UTI (ICD-599.0)   New Problems: UTI (ICD-599.0) New/Updated Medications: NITROFURANTOIN MACROCRYSTAL 100 MG CAPS (NITROFURANTOIN MACROCRYSTAL) 1 by mouth two times a day Prescriptions: NITROFURANTOIN MACROCRYSTAL 100 MG CAPS (NITROFURANTOIN MACROCRYSTAL) 1 by mouth two times a day  #20 x 0   Entered and Authorized by:   Corwin Levins MD   Signed by:   Corwin Levins MD on 03/15/2010   Method used:   Electronically to        [facility] [address] [phone]* (retail)       [address]       [place], [place]  [address]       Ph: [phone] or [phone]       Fax: [phone]   RxID:   [id number]

## 2010-04-19 NOTE — Progress Notes (Signed)
Summary: referral  Phone Note Call from Patient   Caller: Daughter [phone] Summary of Call: Pt's daughter called stating pt is still having bladder spasms and UTI sxs. Daughter is requesting referral to Urologist. Initial call taken by: Margaret Pyle, CMA,  April 02, 2010 12:49 PM  Follow-up for Phone Call        uro referral done Follow-up by: Newt Lukes MD,  April 02, 2010 1:08 PM  Additional Follow-up for Phone Call Additional follow up Details #1::        Daughter informed Additional Follow-up by: Margaret Pyle, CMA,  April 02, 2010 1:26 PM

## 2010-04-19 NOTE — Assessment & Plan Note (Signed)
Summary: ?bladder inf/#/cd   Vital Signs:  Patient profile:   77 year old female Height:      62 inches (157.48 cm) Weight:      138.12 pounds (62.78 kg) O2 Sat:      95 % on Room air Temp:     98.4 degrees F (36.89 degrees C) oral Pulse rate:   73 / minute BP sitting:   120 / 72  (left arm) Cuff size:   regular  Vitals Entered By: Orlan Leavens RMA (March 30, 2010 10:04 AM)  O2 Flow:  Room air CC: ? bladder infection Is Patient Diabetic? No Pain Assessment Patient in pain? no      Comments Need to clarify if pt suppose to take metoprolol two times a day daughter states she is only taking one   Primary Care Provider:  Newt Lukes MD  CC:  ? bladder infection.  History of Present Illness: c/o dysuria onset days ago small vol freq void +suprapubic, flank no hematuria or fever wipes forward after any BR  also reviewed chronic med issues - HTN - reports compliance with ongoing medical treatment. denies adverse side effects related to current therapy. prev changed toprol xr to two times a day for cost concerns  hypothyroid - has followed with endo for same but would like to come here - reports compliance with ongoing medical treatment but freq changes in medication dose or frequency. denies adverse side effects related to current therapy.   breast cancer hx - on tamoxifen and follows q6-9mo with onc for same  dyslipidemia - reports compliance with ongoing medical treatment and no changes in medication dose or frequency. denies adverse side effects related to current therapy.   Clinical Review Panels:  CBC   WBC:  6.4 (02/20/2010)   RBC:  4.11 (02/20/2010)   Hgb:  13.0 (02/20/2010)   Hct:  38.9 (02/20/2010)   Platelets:  133 (02/20/2010)   MCV  94.6 (02/20/2010)   RDW  13.6 (02/20/2010)   PMN:  54 (02/20/2010)   Monos:  13 (02/20/2010)   Eosinophils:  0.1 (02/20/2010)   Basophil:  0 (02/20/2010)  Complete Metabolic Panel   Glucose:  97 (02/20/2010)  Sodium:  136 (02/20/2010)   Potassium:  4.5 (02/20/2010)   Chloride:  102 (02/20/2010)   CO2:  25 (02/20/2010)   BUN:  15 (02/20/2010)   Creatinine:  1.11 (02/20/2010)   Albumin:  3.5 (02/20/2010)   Total Protein:  6.6 (02/20/2010)   Calcium:  9.3 (02/20/2010)   Total Bili:  0.4 (02/20/2010)   Alk Phos:  59 (02/20/2010)   SGPT (ALT):  15 (02/20/2010)   SGOT (AST):  29 (02/20/2010)   Current Medications (verified): 1)  Metoprolol Tartrate 25 Mg Tabs (Metoprolol Tartrate) .... Take 1 By Mouth Two Times A Day 2)  Tamoxifen Citrate 20 Mg Tabs (Tamoxifen Citrate) .... Take 1 By Mouth Once Daily 3)  Amlodipine Besylate 5 Mg Tabs (Amlodipine Besylate) .... Take 1 By Mouth Once Daily 4)  Simvastatin 40 Mg Tabs (Simvastatin) .... Take 1 By Mouth Once Daily 5)  Synthroid 50 Mcg Tabs (Levothyroxine Sodium) .... Take 1 By Mouth Once Daily 6)  Alprazolam 0.25 Mg Tabs (Alprazolam) .... Take 1-2 By Mouth At Bedtime As Needed 7)  Centrum Silver  Tabs (Multiple Vitamins-Minerals) .... Take 1 By Mouth Once Daily 8)  Omeprazole 40 Mg Cpdr (Omeprazole) .... 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    Physical Exam  General:  elderly woman, alert, well-developed, well-nourished, and cooperative to examination.   mildly uncomfortable but NAD - dtr at  side Lungs:  normal respiratory effort, no intercostal retractions or use of accessory muscles; normal breath sounds bilaterally - no crackles and no wheezes.    Heart:  normal rate, regular rhythm, no murmur, and no rub. BLE without edema. Abdomen:  soft, non-tender, normal bowel sounds, no distention; no masses and no appreciable hepatomegaly or splenomegaly.     Impression & Recommendations:  Problem # 1:  UTI (ICD-599.0)  The following medications were removed from the medication list:    Nitrofurantoin Macrocrystal 100 Mg Caps (Nitrofurantoin macrocrystal) .Marland Kitchen... 1 by mouth two times a day Her updated medication list for this problem includes:    Amoxicillin 250 Mg Caps (Amoxicillin) .Marland Kitchen... 1 by mouth three times a day x 5 day    Enablex 7.5 Mg Xr24h-tab (Darifenacin hydrobromide) .Marland Kitchen... 1 by mouth at bedtime for bladder  Orders: UA Dipstick w/o Micro (manual) (16109) Prescription Created Electronically [phone])  Encouraged to push clear liquids, get enough rest, and take acetaminophen as needed. To be seen in 10 days if no improvement, sooner if worse.  Problem # 2:  OVERACTIVE BLADDER (ICD-596.51)  start med for tx same to see if this helps with bladder irritabiity and noctural symptoms   Orders: Prescription Created Electronically [phone])  Problem # 3:  ABDOMINAL PAIN, GENERALIZED (ICD-789.07)  nonspecific exam - recent labs and CT unremarkable for cause of symptoms  noted RLQ mass, ? carcinoid tumor - slight progressive growth since 08-14-2004 - not felt to be cause of symptoms per recent surg eval ([facility])  ongoing intermitt sick symptoms with diarrhea - pending appt GI for further eval and tx as needed - has seen [facility] in past off hydrocodone and cont BZ as needed - ok for otc immodium as needed  also try OAB agent for bladder irritability as discussed above  Discussed symptom control with the patient.   Complete Medication List: 1)  Metoprolol Tartrate 25 Mg Tabs  (Metoprolol tartrate) .... Take 1 by mouth two times a day 2)  Tamoxifen Citrate 20 Mg Tabs (Tamoxifen citrate) .... Take 1 by mouth once daily 3)  Amlodipine Besylate 5 Mg Tabs (Amlodipine besylate) .... Take 1 by mouth once daily 4)  Simvastatin 40 Mg Tabs (Simvastatin) .... Take 1 by mouth once daily 5)  Synthroid 50 Mcg Tabs (Levothyroxine sodium) .... Take 1 by mouth once daily 6)  Alprazolam 0.25 Mg Tabs (Alprazolam) .... Take 1-2 by mouth at bedtime as needed 7)  Centrum Silver Tabs (Multiple vitamins-minerals) .... Take 1 by mouth once daily 8)  Omeprazole 40 Mg Cpdr (Omeprazole) .... Take 1 by mouth once daily 9)  Calcium Citrate-vitamin D 200-125 Mg-unit Tabs (Calcium citrate-vitamin d) .... 600mg  once daily 10)  Amoxicillin 250 Mg Caps (Amoxicillin) .Marland KitchenMarland KitchenMarland Kitchen  1 by mouth three times a day x 5 day 11)  Enablex 7.5 Mg Xr24h-tab (Darifenacin hydrobromide) .Marland Kitchen.. 1 by mouth at bedtime for bladder  Patient Instructions: 1)  it was good to see you today. 2)  amoxicillin for bladder infection 3)  also start enabelx for bladder spasm and pain - 4)  your prescriptions have been electronically submitted to your pharmacy. Please take as directed. Contact our office if you believe you're having problems with the medication(s).  5)  rememebr to wipe backwards after any bowel movement, not forward 6)  Please schedule a follow-up appointment in May to check thyroid and review other issues, call sooner if problems.  7)  keep appointment with Dr. Juanda Chance for your stomach symptoms as planned - will move up if abble Prescriptions: ENABLEX 7.5 MG XR24H-TAB (DARIFENACIN HYDROBROMIDE) 1 by mouth at bedtime for bladder  #30 x 3   Entered and Authorized by:   Newt Lukes MD   Signed by:   Newt Lukes MD on 03/30/2010   Method used:   Electronically to        [facility] [address] [phone]* (retail)       [address]       [place], [place]  [address]       Ph: [phone] or  [phone]       Fax: [phone]   RxID:   [id number] AMOXICILLIN 250 MG CAPS (AMOXICILLIN) 1 by mouth three times a day x 5 day  #15 x 0   Entered and Authorized by:   Newt Lukes MD   Signed by:   Newt Lukes MD on 03/30/2010   Method used:   Electronically to        [facility] [address] [phone]* (retail)       [address]       [place], [place]  [address]       Ph: [phone] or [phone]       Fax: [phone]   RxID:   [id number]    Orders Added: 1)  UA Dipstick w/o Micro (manual) [81002] 2)  Est. Patient Level IV [77824] 3)  Prescription Created Electronically [phone]    Laboratory Results   Urine Tests    Routine Urinalysis   Color: yellow Appearance: Cloudy Glucose: negative   (Normal Range: Negative) Bilirubin: negative   (Normal Range: Negative) Ketone: negative   (Normal Range: Negative) Spec. Gravity: 1.025   (Normal Range: 1.003-1.035) Blood: moderate   (Normal Range: Negative) pH: 5.0   (Normal Range: 5.0-8.0) Protein: >=300   (Normal Range: Negative) Urobilinogen: 0.2   (Normal Range: 0-1) Nitrite: negative   (Normal Range: Negative) Leukocyte Esterace: large   (Normal Range: Negative)

## 2010-04-19 NOTE — Letter (Signed)
Summary: [facility]   Imported By: Sherian Rein 04/04/2010 11:34:13  _____________________________________________________________________  External Attachment:    Type:   Image     Comment:   External Document

## 2010-04-19 NOTE — Progress Notes (Signed)
Summary: synthroid  Phone Note Refill Request Message from:  Fax from Pharmacy on March 16, 2010 8:18 AM  Refills Requested: Medication #1:  SYNTHROID 50 MCG TABS take 1 by mouth once daily [facility] [address]    Method Requested: Electronic Initial call taken by: Orlan Leavens RMA,  March 16, 2010 8:18 AM    Prescriptions: SYNTHROID 50 MCG TABS (LEVOTHYROXINE SODIUM) take 1 by mouth once daily  #30 x 5   Entered by:   Orlan Leavens RMA   Authorized by:   Newt Lukes MD   Signed by:   Orlan Leavens RMA on 03/16/2010   Method used:   Electronically to        [facility] [address] [phone]* (retail)       [address]       [place], [place]  [address]       Ph: [phone] or [phone]       Fax: [phone]   RxID:   [id number]

## 2010-04-19 NOTE — Letter (Addendum)
Summary: [facility]   Imported By: Lester Hosston 03/22/2010 09:22:06  _____________________________________________________________________  External Attachment:    Type:   Image     Comment:   External Document

## 2010-04-25 NOTE — Letter (Signed)
[facility]   Imported By: Sherian Rein 04/16/2010 10:42:32  _____________________________________________________________________  External Attachment:    Type:   Image     Comment:   External Document

## 2010-04-25 NOTE — Letter (Signed)
Summary: Reather Littler MD  Reather Littler MD   Imported By: Sherian Rein 04/19/2010 12:19:57  _____________________________________________________________________  External Attachment:    Type:   Image     Comment:   External Document

## 2010-04-25 NOTE — Letter (Signed)
Summary: Reather Littler MD  Reather Littler MD   Imported By: Sherian Rein 04/19/2010 12:16:59  _____________________________________________________________________  External Attachment:    Type:   Image     Comment:   External Document

## 2010-04-25 NOTE — Letter (Signed)
Summary: Reather Littler MD  Reather Littler MD   Imported By: Sherian Rein 04/19/2010 12:19:18  _____________________________________________________________________  External Attachment:    Type:   Image     Comment:   External Document

## 2010-04-27 ENCOUNTER — Encounter: Payer: Self-pay | Admitting: Internal Medicine

## 2010-05-01 ENCOUNTER — Other Ambulatory Visit: Payer: Self-pay | Admitting: Internal Medicine

## 2010-05-01 DIAGNOSIS — Z1231 Encounter for screening mammogram for malignant neoplasm of breast: Secondary | ICD-10-CM

## 2010-05-01 DIAGNOSIS — Z9889 Other specified postprocedural states: Secondary | ICD-10-CM

## 2010-05-02 DIAGNOSIS — R197 Diarrhea, unspecified: Secondary | ICD-10-CM | POA: Insufficient documentation

## 2010-05-02 DIAGNOSIS — Z860100 Personal history of colon polyps, unspecified: Secondary | ICD-10-CM | POA: Insufficient documentation

## 2010-05-02 DIAGNOSIS — G20A1 Parkinson's disease without dyskinesia, without mention of fluctuations: Secondary | ICD-10-CM | POA: Insufficient documentation

## 2010-05-02 DIAGNOSIS — F329 Major depressive disorder, single episode, unspecified: Secondary | ICD-10-CM | POA: Insufficient documentation

## 2010-05-02 DIAGNOSIS — D649 Anemia, unspecified: Secondary | ICD-10-CM | POA: Insufficient documentation

## 2010-05-02 DIAGNOSIS — K449 Diaphragmatic hernia without obstruction or gangrene: Secondary | ICD-10-CM | POA: Insufficient documentation

## 2010-05-02 DIAGNOSIS — F3289 Other specified depressive episodes: Secondary | ICD-10-CM | POA: Insufficient documentation

## 2010-05-02 DIAGNOSIS — F411 Generalized anxiety disorder: Secondary | ICD-10-CM | POA: Insufficient documentation

## 2010-05-02 DIAGNOSIS — G20 Parkinson's disease: Secondary | ICD-10-CM | POA: Insufficient documentation

## 2010-05-03 NOTE — Letter (Signed)
Summary: [facility]   Imported By: Sherian Rein 04/26/2010 07:57:46  _____________________________________________________________________  External Attachment:    Type:   Image     Comment:   External Document

## 2010-05-07 ENCOUNTER — Encounter: Payer: Self-pay | Admitting: Internal Medicine

## 2010-05-07 ENCOUNTER — Encounter (INDEPENDENT_AMBULATORY_CARE_PROVIDER_SITE_OTHER): Payer: Self-pay | Admitting: *Deleted

## 2010-05-07 ENCOUNTER — Other Ambulatory Visit: Payer: Medicare Other

## 2010-05-07 ENCOUNTER — Ambulatory Visit (INDEPENDENT_AMBULATORY_CARE_PROVIDER_SITE_OTHER): Payer: Medicare Other | Admitting: Internal Medicine

## 2010-05-07 DIAGNOSIS — R933 Abnormal findings on diagnostic imaging of other parts of digestive tract: Secondary | ICD-10-CM

## 2010-05-07 DIAGNOSIS — R1903 Right lower quadrant abdominal swelling, mass and lump: Secondary | ICD-10-CM

## 2010-05-09 NOTE — Procedures (Signed)
Summary: COLON   Colonoscopy  Procedure date:  04/01/2005  Findings:      Location:  [facility].   Patient Name: Katelyn, [name] MRN:  Procedure Procedures: Colonoscopy CPT: [phone].    with biopsy. CPT: Q5068410.  Personnel: Endoscopist: Lachell Rochette L. Juanda Chance, MD.  Exam Location: Exam performed in Outpatient Clinic. Outpatient  Patient Consent: Procedure, Alternatives, Risks and Benefits discussed, consent obtained, from patient. Consent was obtained by the RN.  Indications Symptoms: Abdominal pain / bloating.  Surveillance of: Adenomatous Polyp(s). 2000.  Increased Risk Screening: For family history of colorectal neoplasia, in  parent sibling  History  Current Medications: Patient is not currently taking Coumadin.  Pre-Exam Physical: Performed Apr 01, 2005. Entire physical exam was normal.  Comments: Pt. history reviewed/updated, physical exam performed prior to initiation of sedation?yes Exam Exam: Extent of exam reached: Cecum, extent intended: Cecum.  The cecum was identified by appendiceal orifice and IC valve. Colon retroflexion performed. Images taken. ASA Classification: II. Tolerance: good.  Monitoring: Pulse and BP monitoring, Oximetry used. Supplemental O2 given.  Colon Prep Used Miralax for colon prep. Prep results: good.  Sedation Meds: Patient assessed and found to be appropriate for moderate (conscious) sedation. Fentanyl 50 mcg. given IV. Versed 6 mg. given IV.  Findings - OTHER FINDING: large smooth ileocecal valve found in Cecum. Biopsy/Other Finding taken.  - NORMAL EXAM: Cecum.  - MULTIPLE POLYPS: Rectum. minimum size 2 mm, maximum size 3 mm. Procedure:  biopsy without cautery, removed, Polyp retrieved, 4 polyps Polyps sent to pathology. ICD9: Colon Polyps: 211.3.   Assessment Abnormal examination, see findings above.  Diagnoses: 211.3: Colon Polyps.   Comments: diminutive polyps of the rectum, no evidence of ischemic  colitis Events  Unplanned Interventions: No intervention was required.  Unplanned Events: There were no complications. Plans Medication Plan: Await pathology.  Patient Education: Patient given standard instructions for: Yearly hemoccult testing recommended.  Disposition: After procedure patient sent to recovery. After recovery patient sent home.   This report was created from the original endoscopy report, which was reviewed and signed by the above listed endoscopist.

## 2010-05-09 NOTE — Discharge Summary (Signed)
Summary: Bibasilar Infiltrates, Diarrhea    NAME:  Katelyn [name], Katelyn [name]               ACCOUNT NO.:  [id number]      MEDICAL RECORD NO.:  [id number]          PATIENT TYPE:  INP      LOCATION:  A330                          FACILITY:  [facility]      PHYSICIAN:  Elliot Cousin, M.D.    DATE OF BIRTH:  October 26, 1918      DATE OF ADMISSION:  11/19/2009   DATE OF DISCHARGE:  09/09/2011LH                             DISCHARGE SUMMARY-REFERRING      DISCHARGE DIAGNOSES AND HOSPITAL COURSE:   1. BIBASILAR INFILTRATES, SECONDARY TO EITHER PNEUMONIA OR       ATELECTASIS.  At the time of the initial hospital assessment, the       patient's temperature was 100.8.  Her white blood cell count was       8.9.  Her chest x-ray revealed mild bibasilar infiltrates, likely       representing atelectasis but early infection could not be excluded.       Blood cultures were ordered.  As of today, one out of 2 blood       cultures have become positive for Staphylococcus species.  It is       likely that this is a contaminant.  The culture report is       preliminary.  The final report will need to be followed in the next       24-48 hours.  She was started on treatment with azithromycin and       Rocephin empirically.  Due to the preliminary positive blood       culture, vancomycin was added approximately 24 hours later.  A       follow-up chest x-ray revealed relatively unchanged mild bibasilar       opacities.  The patient had very few complaints of shortness of       breath or cough.  After several days of therapy with Rocephin,       azithromycin, and vancomycin, they were discontinued.  It was       uncertain whether or not the patient had true pneumonia.  It was       believed that her presenting symptoms were likely secondary to the       urinary tract infection or perhaps infectious diarrhea.   2. ENTEROCOCCUS URINARY TRACT INFECTION.  The patient's urinalysis was       essentially unremarkable.  Surprisingly a  urine culture was       ordered..  It eventually grew out 80,000 colonies of Enterococcus       species, sensitive to vancomycin, Levaquin, and ampicillin.       Vancomycin was started but was discontinued later.  She was       subsequently started on Levaquin 500 mg daily on November 23, 2009.       She has no complaints of painful urination currently.  She has       completed a total of 3 days of therapy with either vancomycin or       Levaquin.  She  will be discharged on 4 more days of Levaquin.  She       is no longer febrile.  Her white blood cell count has remained       within normal limits.   3. DIARRHEA.  The patient developed diarrhea on hospital day one.  She       was started empirically on Flagyl orally.  Stool studies were       ordered and at the time of this dictation, her C. difficile toxins       have been negative.  She had been treated with outpatient       antibiotics prior to this hospitalization.  Certainly she is at       risk of developing C. difficile colitis.  The extent of diarrhea       has almost resolved.  Her stools are now semi-formed.  She has       completed 5 days of therapy with Flagyl.  She will be discharged on       4 more days of treatment.   4. THROMBOCYTOPENIA AND NORMOCYTIC ANEMIA.  The patient's platelet       count was 156 on admission.  Her hemoglobin was 12.2.  Following       the initiation of the IV fluids and antibiotics, her hemoglobin       gradually drifted down to 10.2 but stabilized at 11.2 prior to       discharge.  Her platelet count decreased as well, falling to a       nadir of 117.  It stabilized at 145 prior to hospital discharge.  A       vitamin B12 level was ordered.  It was actually elevated at 960.       Her ferritin was within normal limits at 153.  It was believed that       the decrease in the patient's hemoglobin and platelet count were       the consequence of the dilutional effects of IV fluids.  The        thrombocytopenia, in particular, may have been secondary to the       infections and/or antibiotic therapy.  There were no sequelae from       these changes.   5. HYPERTENSION.  The patient was maintained on metoprolol.  However,       Norvasc was withheld initially due to relatively low to normal       blood pressures.  However, when the patient's blood pressure       started trending upward, Norvasc was restarted at 10 mg daily.       Currently, her blood pressure is 135/84.   6. HYPOKALEMIA.  The patient became mildly hypokalemic.  She was       treated with potassium chloride orally.  Her serum potassium       improved to 3.6.   7. HYPOTHYROIDISM.  The patient was maintained on Synthroid.  Her TSH       was within normal limits at 2.075.   8. DECONDITIONING.  The patient became deconditioned.  She was       evaluated by the physical therapist.  The physical therapist did       recommend short-term skilled nursing facility placement for ongoing       strengthening.  She will be discharged to [facility] skilled       nursing facility today in improved and stable condition.  DISCHARGE MEDICATIONS:   1. Amlodipine 10 mg daily.   2. Wellbutrin 150 mg b.i.d.   3. Os-Cal with vitamin D one tablet daily.   4. Levaquin 500 mg daily for 4 more days.   5. Flagyl 500 mg every 8 hours for 4 more days.   6. Synthroid 37.5 mcg daily.   7. Metoprolol 25 mg b.i.d.   8. Multivitamin with iron once daily.   9. Omeprazole 20 mg daily.   10.Potassium chloride 20 mEq daily.   11.Tamoxifen 20 mg daily.      DISCHARGE DISPOSITION:  The patient was discharged to [facility] skilled   nursing facility in improved and stable condition.               Elliot Cousin, M.D.            DF/MEDQ  D:  11/24/2009  T:  11/24/2009  Job:  [id number]      cc:   Antony Madura, M.D.   Fax: [phone]      Electronically Signed by Elliot Cousin M.D. on 11/24/2009 01:14:49 PM

## 2010-05-09 NOTE — Discharge Summary (Signed)
Summary: Abdominal Pain   NAME:  Katelyn [name], Katelyn [name]               ACCOUNT NO.:  [id number]   MEDICAL RECORD NO.:  [id number]          PATIENT TYPE:  INP   LOCATION:  5735                         FACILITY:  [facility]   PHYSICIAN:  Mobolaji B. Bakare, M.D.DATE OF BIRTH:  07-12-1918   DATE OF ADMISSION:  02/24/2005  DATE OF DISCHARGE:  02/28/2005                                 DISCHARGE SUMMARY   PRIMARY CARE PHYSICIAN:  Duffy Rhody C. Andrey Campanile, M.D.   CONSULT:  1.  GI consult, Rachael Fee, M.D.  [facility].   FINAL DIAGNOSES:  1.  Paracecal inflammatory process.  2.  Hypokalemia.  3.  Hypertension.  4.  History of Parkinson's disease.   PROCEDURES:  CT scan of abdomen and pelvis done on December 10 showed no  acute upper abdominal findings.  Pelvic CT showed paracecal inflammatory  stranding with some indistinct regional lymph nodes suggesting inflammation.  In addition, there is a 15 mm polypoid lesion along the terminal ileum wall.   BRIEF HISTORY:  [name] is an 77 year old Caucasian female with multiple  medical problems.  She presented with recurrent episode of lower abdominal  pain sometimes associated with nausea and vomiting.  There has been no  fever, no diarrhea.  She intermittently gets constipated.  She has had  multiple colonoscopies in the past.  The last one was about three years ago  by Dr. Juanda Chance.  Temperature on admission was 97.1, with a pulse of 84 and  blood pressure 171/92.  On examination, abdomen was slightly distended but  nontender.  Bowel sounds were decreased.  There was no rebound or guarding  and no palpable organomegaly.  Next, [name] was then admitted for  evaluation.  CT scan of the abdomen is as noted above.   HOSPITAL COURSE:  She was empirically started on Unasyn although there was  no mention of diverticula noted on pelvic CT.  The etiology of the paracecal  inflammation was not clear.  White cell count was 8.6.  She was seen in  consultation  by GI.  It was felt that she could have mild diverticulitis.  She was continued on IV antibiotics.  Colonoscopy was withheld until  patient's acute episode resolves.  She continued to improve and diet was  restarted on full liquids and subsequently advanced and patient was able to  tolerate.  She was also tested for Clostridium difficile toxin which were  negative.  Stool culture was negative.  The patient continued to improve and  she was to be discharged home on p.o. antibiotics.  She had an episode of  hypokalemia and this was corrected.  Blood pressure improved on home  regimen.   DISCHARGE MEDICATIONS:  1.  Synthroid 88 mcg p.o. daily.  2. Lovastatin 40 mg p.o. daily.  3.      Carbidopa 25/100 one p.o. b.i.d.  4. Wellbutrin 150 mg p.o. daily  5.      Hydrochlorothiazide 12.5 mg p.o. daily.  6. Potassium chloride ER 20 mEq      p.o. daily.  7. Augmentin 875 mg p.o. b.i.d.  to complete seven-day      course of Augmentin.   FOLLOW-UP:  Follow up with Dr. Juanda Chance on 04/02/05 and Dr. Margrett Rud in 1-  2 weeks.   DISCHARGE LABORATORY DATA:  Sodium 141, potassium 3.2, chloride 106,  bicarbonate 27, glucose 97, BUN 7, creatinine 0.9, calcium 9.0,  white cells  9.1, hemoglobin 13.0, hematocrit 37.6, platelets 150, MCV 89.4.      Mobolaji B. Corky Downs, M.D.  Electronically Signed     MBB/MEDQ  D:  03/11/2005  T:  03/12/2005  Job:  [id number]   cc:   Vale Haven. Andrey Campanile, M.D.  Fax: [phone]   [name], M.D. [facility]  [address]  [place]  [place] [address]

## 2010-05-09 NOTE — Assessment & Plan Note (Signed)
Summary: Gastroenterology  Franchot Erichsen MR#:  [id number] Page #  NAME:  Katelyn [name], Katelyn [name]  OFFICE NO:  [id number]  DATE:  03/19/05  DOB:  [birth year]  HISTORY OF PRESENT ILLNESS:  The patient is a delightful 77 year old white female patient of Dr. Tawana Scale who was hospitalized at [facility] 2 weeks ago for a period of 6 days with acute lower abdominal pain.  Unfortunately, we have not been able to retrieve her discharge summary or her hospital records because she was admitted under her deceased husband's social security number, and for some reason we have not been able to get her records.  We have also not been able to locate our office records.  I have done several colonoscopies in the past.  That chart is on the way from Medical Records at the moment.  Patient says that her abdominal pain has been markedly improved since her discharge from the hospital.  It appears that she most likely had an ischemic colitis.  Judging from the description of the pain and the diagnostic studies she had, she was thought to have colitis.  She also has history of colon polyps.  Other medical problems include Parkinson disease.  She has a positive family history of colon cancer in father and mother, and daughter and son who have colon polyps.  Her initial pain was in left lower and left upper quadrants, but today she says she has been having tenderness only in the right lower quadrant.  There has been no fever, no rectal bleeding.  MEDICATIONS:  K-Dur 20 mEq q.d., Wellbutrin 150 mg p.o. q.d., lovastatin 20 mg p.o. q.d., Synthroid 88 p.o. q.d., carbidopa and levodopa 25 mg p.o. b.i.d., hydrochlorothiazide 12.5 mg p.o. q.d.  PAST HISTORY:  Significant for high blood pressure, high cholesterol, thyroid problems, depression, breast cancer.  Operations:  Hysterectomy, breast surgery with biopsies, and colonoscopy with last one about 3 years ago.  FAMILY HISTORY:  Positive for prostate cancer, and breast cancer in a  sister.  SOCIAL HISTORY:  She has 4 children.  She is widowed and lives alone.  She is retired.  She does not smoke or drink alcohol.  REVIEW OF SYSTEMS:  Positive for eyeglasses, arthritic complaints, confusion at times, new depression, and new anxiety.  PHYSICAL EXAMINATION:  Blood pressure 170/90; pulse 86; and weight 131 pounds.  She was alert, oriented, in no distress.  She wore a hearing aid.  Lungs were clear to auscultation.  COR with normal S1; normal S2.  Abdomen was soft with tenderness in right middle and right lower quadrants.  There was no palpable mass, and there was no rebound.  The liver edge was at the costal margin.  No surgical scars.  Rectal exam showed soft, Hemoccult-negative stool.  Rectal tone was somewhat decreased.  Extremities:  No edema.  IMPRESSION: 1.  An 77 year old white female with recent acute colitis, most likely ischemic.  Her records from [facility] are pending.  She is currently heme-negative and much improved. 2.  Positive family history of colon cancer.  Patient had previous colorectal screening. 3.  Personal history of colon polyps. 4.  Chronic constipation, currently not a problem.  PLAN: 1.  Patient has been scheduled for colonoscopy pending review of the records from [facility].  She preferred to use OsmoPrep, which uses tablets and liquid. 2.  Continue all her medications but discontinue aspirin and nonsteroidal anti-inflammatory drugs 1 week prior to the colonoscopy.     Hedwig Morton. Juanda Chance, M.D.  [id number] cc:  Margrett Rud, MD D:  03/19/05; T:  ; Job [id number]

## 2010-05-10 ENCOUNTER — Encounter: Payer: Self-pay | Admitting: Internal Medicine

## 2010-05-14 ENCOUNTER — Ambulatory Visit
Admission: RE | Admit: 2010-05-14 | Discharge: 2010-05-14 | Disposition: A | Discharge status: Home / Self Care | Payer: Medicare Other | Source: Ambulatory Visit | Attending: Internal Medicine | Admitting: Internal Medicine

## 2010-05-14 ENCOUNTER — Other Ambulatory Visit: Payer: Self-pay | Admitting: Internal Medicine

## 2010-05-14 ENCOUNTER — Encounter: Payer: Self-pay | Admitting: Internal Medicine

## 2010-05-14 DIAGNOSIS — N6310 Unspecified lump in the right breast, unspecified quadrant: Secondary | ICD-10-CM

## 2010-05-14 DIAGNOSIS — Z9889 Other specified postprocedural states: Secondary | ICD-10-CM

## 2010-05-15 ENCOUNTER — Encounter: Payer: Self-pay | Admitting: Internal Medicine

## 2010-05-15 ENCOUNTER — Encounter (INDEPENDENT_AMBULATORY_CARE_PROVIDER_SITE_OTHER): Payer: Medicare Other

## 2010-05-15 ENCOUNTER — Encounter (INDEPENDENT_AMBULATORY_CARE_PROVIDER_SITE_OTHER): Payer: Self-pay | Admitting: *Deleted

## 2010-05-15 DIAGNOSIS — D1330 Benign neoplasm of unspecified part of small intestine: Secondary | ICD-10-CM

## 2010-05-15 DIAGNOSIS — R933 Abnormal findings on diagnostic imaging of other parts of digestive tract: Secondary | ICD-10-CM

## 2010-05-15 NOTE — Assessment & Plan Note (Signed)
Summary: ABD PAIN/YF   LAST ROV W/DR. Tanayia Wahlquist 2004//MAILER SENT//CX FEE   History of Present Illness Visit Type: Initial Consult Primary GI MD: [name] MD Primary Provider: Newt Lukes MD Requesting Provider: Newt Lukes MD Chief Complaint: Patient complains of lower abdominal pain that starts at 3:30am and stops around 4am. She states that she does not have any pain during the day. She is taking Aleve and there is no pain at night. She does have some diarrhea and she is taking Imodium every 3-4 days.   History of Present Illness:   This is an 77 year old white female with a speculated mass in the right lower quadrant seen on a CT scan of the abdomen 02/20/10. The mass is in the mesentery and has grown in size compared to the 2006 CT scan from 0.9 x 1.3 cm to 1.4x2.1 cm. She had a prior hysterectomy. She has a thyroid mass and history of breast cancer. She also has questionable Parkinson's disease. There is a small pulmonary nodule and history of C. difficile colitis. She has had numerous colonoscopies because of her family history of colon cancer in her parent as well as in a sibling. Her last colonoscopy in 2007 showed a hyperplastic polyp. A prior colonoscopy in 2000 showed an adenomatous polyp. Patient is having diarrhea but her weight has been stable. She denies any episodes of hot flashes or flushing. There have been no nocturnal sweats. She is complaining of nocturnal right lower quadrant abdominal pain which wakes her up at 3 in the morning and lasts about 20 or 30 minutes. Her pain is relieved by urination. It does not bother her during the day at all.there have been no obstructive symptoms.   GI Review of Systems    Reports abdominal pain.     Location of  Abdominal pain: lower abdomen.    Denies acid reflux, belching, bloating, chest pain, dysphagia with liquids, dysphagia with solids, heartburn, loss of appetite, nausea, vomiting, vomiting blood, weight loss, and  weight  gain.      Reports diarrhea.     Denies anal fissure, black tarry stools, change in bowel habit, constipation, diverticulosis, fecal incontinence, heme positive stool, hemorrhoids, irritable bowel syndrome, jaundice, light color stool, liver problems, rectal bleeding, and  rectal pain.    Current Medications (verified): 1)  Metoprolol Tartrate 50 Mg Tabs (Metoprolol Tartrate) .... Take One By Mouth Once Daily 2)  Tamoxifen Citrate 20 Mg Tabs (Tamoxifen Citrate) .... Take 1 By Mouth Once Daily 3)  Amlodipine Besylate 5 Mg Tabs (Amlodipine Besylate) .... Take One By Mouth Once Daily 4)  Simvastatin 40 Mg Tabs (Simvastatin) .... Take 1 By Mouth Once Daily 5)  Synthroid 50 Mcg Tabs (Levothyroxine Sodium) .... Take One By Mouth Once Daily 6)  Alprazolam 0.25 Mg Tabs (Alprazolam) .... Take 1-2 By Mouth At Bedtime As Needed 7)  Centrum Silver  Tabs (Multiple Vitamins-Minerals) .... Take 1 By Mouth Once Daily 8)  Omeprazole 40 Mg Cpdr (Omeprazole) .... Take 1 By Mouth Once Daily 9)  Calcium Citrate-Vitamin D 200-125 Mg-Unit Tabs (Calcium Citrate-Vitamin D) .... 600mg  Once Daily 10)  Wellbutrin Sr 150 Mg Xr12h-Tab (Bupropion Hcl) .... Take 1 Tablet By Mouth Two Times A Day 11)  Multivitamins  Tabs (Multiple Vitamin) .... Take 1 Tablet By Mouth Once A Day  Allergies (verified): No Known Drug Allergies  Past History:  Past Medical History: Reviewed history from 03/30/2010 and no changes required. Hypertension Hypothyroidism Colonic polyps, hx  RLQ mesenteric mass - 1.4cmx2.1 cm CT 02/20/2010 - ?carcinoid GERD Hyperlipidemia Breast cancer,1995 on right - lumpectomy, xrt and tamoxifen        2003/08/08 on left - partial mastectomy with tamoxifen hx depression Aug 08, 1999 following death of spouse  MD roster: endo - [facility] - [facility] - [facility] - [facility] - [facility]  Past Surgical History: Reviewed history from 05/02/2010 and no changes required. Hysterectomy 08-08-67) Breast  -Aug 07, 1993 R lumpectomy + xrt and tamoxifen; 08/08/03 L partial mastectomy w/ Tamoxifen Back surgery 08/07/1957)  Family History: Reviewed history from 05/02/2010 and no changes required. Family History Breast cancer 1st degree relative <50- mother and daughter and sister x2 Family History of Colon CA 1st degree relative <60: Mother, Father? Family History High cholesterol (parent) Family History Hypertension (parent)  Family History of Colon Polyps: Daughter, Sons Family History of Prostate Cancer: Father  Social History: Never Smoked no alcohol widowed, lives alone but supportive children (4 in town) has sitter/aide M-F to assist with chores, shopping/transport, comp Daily Caffeine Use coffee  Review of Systems       The patient complains of hearing problems, sleeping problems, urination - excessive, and urine leakage.  The patient denies allergy/sinus, anemia, anxiety-new, arthritis/joint pain, back pain, blood in urine, breast changes/lumps, change in vision, confusion, cough, coughing up blood, depression-new, fainting, fatigue, fever, headaches-new, heart murmur, heart rhythm changes, itching, menstrual pain, muscle pains/cramps, night sweats, nosebleeds, pregnancy symptoms, shortness of breath, skin rash, sore throat, swelling of feet/legs, swollen lymph glands, thirst - excessive , urination - excessive , urination changes/pain, vision changes, and voice change.         Pertinent positive and negative review of systems were noted in the above HPI. All other ROS was otherwise negative.   Vital Signs:  Patient profile:   77 year old female Height:      62 inches Weight:      135.6 pounds BMI:     24.89 Pulse rate:   74 / minute Pulse rhythm:   regular BP sitting:   112 / 64  (left arm) Cuff size:   regular  Vitals Entered By: Harlow Mares CMA Duncan Dull) (May 07, 2010 10:43 AM)  Physical Exam  General:  Hard of hearing, very nice and pleasant. Eyes:  nonicteric. Nose:  no  discoloration. Mouth:  normal tongue; oral mucosa. Neck:  Supple; no masses or thyromegaly. Lungs:  Clear throughout to auscultation. Heart:  Regular rate and rhythm; no murmurs, rubs,  or bruits. Abdomen:  soft abdomen with tenderness and right lower quadrants, no palpable mass, straight leg raising negative, no CVA tenderness. Rectal:  soft formed Hemoccult-negative stool. Msk:  tender on the sacral spine from recent fall. Lumbar laminectomy scar. Extremities:  No clubbing, cyanosis, edema or deformities noted. Skin:  no stigmata of chronic liver disease. Psych:  alert, oriented and hard of hearing.   Impression & Recommendations:  Problem # 1:  ABDOMINAL MASS, RIGHT LOWER QUADRANT (EAV-409.81) Patient has a small mass in the right lower quadrant which has grown over the past several years and is located at the site of patient's discomfort. It is not clear if the mass is within the small bowel. There is no evidence of adenopathy in that area. She does not have any obstructive symptoms. Her diarrhea has been intermittent and could possibly be related to irritable bowel syndrome. In order to evaluate this further we will do a 24-hour urine collection for HIAA and will also proceed  with a small bowel capsule endoscopy to assess the distal ileum for mass. She is complaining of pain according her daughter on daily basis. She also went to the emergency room with a pain so in patient's mind this is a significant problem ; we will try her on Levbid 0.125 mg sublingually to take p.r.n. Orders: T-Urine 24 Hr. 5 HIAA ([id number])  Problem # 2:  DIARRHEA (ICD-787.91) She takes Imodium for it. This is most likely due to irritable bowel syndrome. We will collect a 24 hour urine for HIAA to rule out carcinoid.  Problem # 3:  Family Hx of COLON CANCER (ICD-153.9) Patient is status post numerous colonoscopies in the past. Her last one was in 2007. Her colonoscopies never showed  any mass within the cecum  or right colon. Because of her age, I would not consider a colonoscopy for diagnosis of the current condition.  Other Orders: Capsule Endoscopy (Capsule Endoscopy)  Patient Instructions: 1)  You have been scheduled for a capsule endoscopy. Please follow the written instructions given to you at your visit today. 2)  Your physician requests that you go to the basement floor of our office to have the following labwork completed before leaving today: Urine for HIAA 3)  Please pick up your prescriptions at the pharmacy. Electronic prescription(s) has already been sent for Levsin SL 0.125 mg every 6 hours as needed for pain. 4)  The medication list was reviewed and reconciled.  All changed / newly prescribed medications were explained.  A complete medication list was provided to the patient / caregiver. 5)  Copy sent to : Dr B.[facility]ox[facility] Prescriptions: LEVSIN/SL 0.125 MG SUBL (HYOSCYAMINE SULFATE) Dissolve 1 tablet under the tongue once every 6 hours as needed for abdominal pain  #20 x 0   Entered by:   Lamona Curl CMA (AAMA)   Authorized by:   Hart Carwin MD   Signed by:   Lamona Curl CMA (AAMA) on 05/07/2010   Method used:   Electronically to        [facility] [address] [phone]* (retail)       [address]       [place], [place]  [address]       Ph: [phone] or [phone]       Fax: [phone]   RxID:   [id number]

## 2010-05-21 ENCOUNTER — Other Ambulatory Visit: Payer: Medicare Other

## 2010-05-23 ENCOUNTER — Ambulatory Visit
Admission: RE | Admit: 2010-05-23 | Discharge: 2010-05-23 | Disposition: A | Discharge status: Home / Self Care | Payer: Medicare Other | Source: Ambulatory Visit | Attending: Internal Medicine | Admitting: Internal Medicine

## 2010-05-23 ENCOUNTER — Other Ambulatory Visit: Payer: Self-pay | Admitting: Diagnostic Radiology

## 2010-05-23 DIAGNOSIS — N6310 Unspecified lump in the right breast, unspecified quadrant: Secondary | ICD-10-CM

## 2010-05-23 DIAGNOSIS — Z9889 Other specified postprocedural states: Secondary | ICD-10-CM

## 2010-05-24 ENCOUNTER — Telehealth: Payer: Self-pay | Admitting: Internal Medicine

## 2010-05-24 NOTE — Assessment & Plan Note (Signed)
Summary: 211.2/Katelyn [name]  Patient here for Capsule Endoscopy for Dr Juanda Chance. Patient and daughter verbalized understanding of all verbal and written instructions. Patient has been NPO and did the prep required for the procedure. Patient had problems swallowing the pill and Dr Juanda Chance ok'd [place] to give the pill with plain yogurt which enabled patient to swallow the pill. LOT 2011-10/16732S   EXP 2013-04                                                             25

## 2010-05-28 ENCOUNTER — Other Ambulatory Visit: Payer: Self-pay | Admitting: Internal Medicine

## 2010-05-28 ENCOUNTER — Ambulatory Visit (INDEPENDENT_AMBULATORY_CARE_PROVIDER_SITE_OTHER)
Admission: RE | Admit: 2010-05-28 | Discharge: 2010-05-28 | Disposition: A | Discharge status: Home / Self Care | Payer: Medicare Other | Source: Ambulatory Visit | Attending: Internal Medicine | Admitting: Internal Medicine

## 2010-05-28 DIAGNOSIS — R1903 Right lower quadrant abdominal swelling, mass and lump: Secondary | ICD-10-CM

## 2010-05-29 LAB — DIFFERENTIAL
Basophils Absolute: 0 10*3/uL (ref 0.0–0.1)
Eosinophils Absolute: 0.1 10*3/uL (ref 0.0–0.7)
Eosinophils Relative: 1 % (ref 0–5)
Monocytes Absolute: 0.8 10*3/uL (ref 0.1–1.0)
Neutrophils Relative %: 54 % (ref 43–77)

## 2010-05-29 LAB — COMPREHENSIVE METABOLIC PANEL
Alkaline Phosphatase: 59 U/L (ref 39–117)
BUN: 15 mg/dL (ref 6–23)
GFR calc non Af Amer: 46 mL/min — ABNORMAL LOW (ref >60)
Glucose, Bld: 97 mg/dL (ref 70–99)
Potassium: 4.5 mEq/L (ref 3.5–5.1)
Total Bilirubin: 0.4 mg/dL (ref 0.3–1.2)
Total Protein: 6.6 g/dL (ref 6.0–8.3)

## 2010-05-29 LAB — CBC
HCT: 38.9 % (ref 36.0–46.0)
MCV: 94.6 fL (ref 78.0–100.0)
RDW: 13.6 % (ref 11.5–15.5)
WBC: 6.4 10*3/uL (ref 4.0–10.5)

## 2010-05-29 LAB — URINALYSIS, ROUTINE W REFLEX MICROSCOPIC
Bilirubin Urine: NEGATIVE
Hgb urine dipstick: NEGATIVE
Ketones, ur: NEGATIVE mg/dL
Protein, ur: NEGATIVE mg/dL
Urobilinogen, UA: 0.2 mg/dL (ref 0.0–1.0)

## 2010-05-29 LAB — URINE CULTURE

## 2010-05-30 ENCOUNTER — Encounter: Payer: Medicare Other | Admitting: Oncology

## 2010-05-31 LAB — DIFFERENTIAL
Basophils Absolute: 0 10*3/uL (ref 0.0–0.1)
Basophils Absolute: 0 10*3/uL (ref 0.0–0.1)
Basophils Absolute: 0 K/uL (ref 0.0–0.1)
Basophils Absolute: 0 K/uL (ref 0.0–0.1)
Basophils Absolute: 0 K/uL (ref 0.0–0.1)
Basophils Relative: 0 % (ref 0–1)
Basophils Relative: 0 % (ref 0–1)
Basophils Relative: 0 % (ref 0–1)
Basophils Relative: 0 % (ref 0–1)
Eosinophils Absolute: 0 10*3/uL (ref 0.0–0.7)
Eosinophils Absolute: 0 10*3/uL (ref 0.0–0.7)
Eosinophils Absolute: 0 10*3/uL (ref 0.0–0.7)
Eosinophils Absolute: 0.1 10*3/uL (ref 0.0–0.7)
Eosinophils Absolute: 0.1 K/uL (ref 0.0–0.7)
Eosinophils Absolute: 0.1 K/uL (ref 0.0–0.7)
Eosinophils Relative: 0 % (ref 0–5)
Eosinophils Relative: 0 % (ref 0–5)
Eosinophils Relative: 1 % (ref 0–5)
Eosinophils Relative: 1 % (ref 0–5)
Lymphocytes Relative: 12 % (ref 12–46)
Lymphocytes Relative: 18 % (ref 12–46)
Lymphocytes Relative: 24 % (ref 12–46)
Lymphocytes Relative: 24 % (ref 12–46)
Lymphs Abs: 1.5 10*3/uL (ref 0.7–4.0)
Lymphs Abs: 1 10*3/uL (ref 0.7–4.0)
Lymphs Abs: 1.2 10*3/uL (ref 0.7–4.0)
Lymphs Abs: 1.2 10*3/uL (ref 0.7–4.0)
Lymphs Abs: 1.3 K/uL (ref 0.7–4.0)
Lymphs Abs: 1.3 K/uL (ref 0.7–4.0)
Monocytes Absolute: 0.9 10*3/uL (ref 0.1–1.0)
Monocytes Absolute: 0.6 10*3/uL (ref 0.1–1.0)
Monocytes Absolute: 0.7 K/uL (ref 0.1–1.0)
Monocytes Absolute: 0.7 K/uL (ref 0.1–1.0)
Monocytes Absolute: 0.9 10*3/uL (ref 0.1–1.0)
Monocytes Relative: 11 % (ref 3–12)
Monocytes Relative: 11 % (ref 3–12)
Monocytes Relative: 12 % (ref 3–12)
Monocytes Relative: 14 % — ABNORMAL HIGH (ref 3–12)
Neutro Abs: 3.3 K/uL (ref 1.7–7.7)
Neutro Abs: 3.5 K/uL (ref 1.7–7.7)
Neutro Abs: 3.6 10*3/uL (ref 1.7–7.7)
Neutro Abs: 6.8 K/uL (ref 1.7–7.7)
Neutrophils Relative %: 74 % (ref 43–77)
Neutrophils Relative %: 61 % (ref 43–77)
Neutrophils Relative %: 63 % (ref 43–77)
Neutrophils Relative %: 64 % (ref 43–77)
Neutrophils Relative %: 67 % (ref 43–77)
Neutrophils Relative %: 69 % (ref 43–77)
Neutrophils Relative %: 77 % (ref 43–77)
WBC Morphology: INCREASED

## 2010-05-31 LAB — URINE CULTURE
Colony Count: 80000
Culture  Setup Time: 201109052330

## 2010-05-31 LAB — BASIC METABOLIC PANEL
Calcium: 9.4 mg/dL (ref 8.4–10.5)
Calcium: 8.2 mg/dL — ABNORMAL LOW (ref 8.4–10.5)
Calcium: 9 mg/dL (ref 8.4–10.5)
Creatinine, Ser: 0.98 mg/dL (ref 0.4–1.2)
GFR calc Af Amer: >60 mL/min (ref >60)
GFR calc Af Amer: >60 mL/min (ref >60)
GFR calc non Af Amer: 54 mL/min — ABNORMAL LOW (ref >60)
GFR calc non Af Amer: >60 mL/min (ref >60)
GFR calc non Af Amer: >60 mL/min (ref >60)
Glucose, Bld: 114 mg/dL — ABNORMAL HIGH (ref 70–99)
Glucose, Bld: 97 mg/dL (ref 70–99)
Potassium: 3.4 mEq/L — ABNORMAL LOW (ref 3.5–5.1)
Sodium: 135 mEq/L (ref 135–145)
Sodium: 135 mEq/L (ref 135–145)
Sodium: 141 mEq/L (ref 135–145)

## 2010-05-31 LAB — CLOSTRIDIUM DIFFICILE EIA
C difficile Toxins A+B, EIA: NEGATIVE
C difficile Toxins A+B, EIA: NEGATIVE

## 2010-05-31 LAB — BASIC METABOLIC PANEL WITH GFR
BUN: 6 mg/dL (ref 6–23)
BUN: 7 mg/dL (ref 6–23)
CO2: 25 meq/L (ref 19–32)
CO2: 25 meq/L (ref 19–32)
Calcium: 8.6 mg/dL (ref 8.4–10.5)
Calcium: 8.7 mg/dL (ref 8.4–10.5)
Chloride: 107 meq/L (ref 96–112)
Chloride: 109 meq/L (ref 96–112)
Creatinine, Ser: 0.78 mg/dL (ref 0.4–1.2)
Creatinine, Ser: 0.81 mg/dL (ref 0.4–1.2)
GFR calc non Af Amer: >60 mL/min
GFR calc non Af Amer: >60 mL/min
Glucose, Bld: 111 mg/dL — ABNORMAL HIGH (ref 70–99)
Glucose, Bld: 97 mg/dL (ref 70–99)
Potassium: 3.2 meq/L — ABNORMAL LOW (ref 3.5–5.1)
Potassium: 3.6 meq/L (ref 3.5–5.1)
Sodium: 138 meq/L (ref 135–145)
Sodium: 140 meq/L (ref 135–145)

## 2010-05-31 LAB — CBC
HCT: 30 % — ABNORMAL LOW (ref 36.0–46.0)
HCT: 30.1 % — ABNORMAL LOW (ref 36.0–46.0)
HCT: 30.8 % — ABNORMAL LOW (ref 36.0–46.0)
HCT: 36.1 % (ref 36.0–46.0)
Hemoglobin: 13.1 g/dL (ref 12.0–15.0)
Hemoglobin: 10.2 g/dL — ABNORMAL LOW (ref 12.0–15.0)
Hemoglobin: 10.3 g/dL — ABNORMAL LOW (ref 12.0–15.0)
Hemoglobin: 10.4 g/dL — ABNORMAL LOW (ref 12.0–15.0)
Hemoglobin: 10.9 g/dL — ABNORMAL LOW (ref 12.0–15.0)
Hemoglobin: 12.2 g/dL (ref 12.0–15.0)
MCH: 31 pg (ref 26.0–34.0)
MCH: 31.4 pg (ref 26.0–34.0)
MCH: 31.4 pg (ref 26.0–34.0)
MCH: 31.8 pg (ref 26.0–34.0)
MCHC: 33.9 g/dL (ref 30.0–36.0)
MCHC: 33.8 g/dL (ref 30.0–36.0)
MCHC: 33.9 g/dL (ref 30.0–36.0)
MCHC: 34 g/dL (ref 30.0–36.0)
MCHC: 34.7 g/dL (ref 30.0–36.0)
MCV: 91.7 fL (ref 78.0–100.0)
MCV: 92.2 fL (ref 78.0–100.0)
MCV: 92.3 fL (ref 78.0–100.0)
MCV: 92.7 fL (ref 78.0–100.0)
MCV: 93 fL (ref 78.0–100.0)
Platelets: 179 10*3/uL (ref 150–400)
Platelets: 121 10*3/uL — ABNORMAL LOW (ref 150–400)
Platelets: 125 K/uL — ABNORMAL LOW (ref 150–400)
Platelets: 142 K/uL — ABNORMAL LOW (ref 150–400)
Platelets: 145 10*3/uL — ABNORMAL LOW (ref 150–400)
Platelets: 156 10*3/uL (ref 150–400)
RBC: 4.23 MIL/uL (ref 3.87–5.11)
RBC: 3.26 MIL/uL — ABNORMAL LOW (ref 3.87–5.11)
RBC: 3.27 MIL/uL — ABNORMAL LOW (ref 3.87–5.11)
RBC: 3.33 MIL/uL — ABNORMAL LOW (ref 3.87–5.11)
RBC: 3.48 MIL/uL — ABNORMAL LOW (ref 3.87–5.11)
RBC: 3.59 MIL/uL — ABNORMAL LOW (ref 3.87–5.11)
RBC: 3.89 MIL/uL (ref 3.87–5.11)
RDW: 14 % (ref 11.5–15.5)
RDW: 14.1 % (ref 11.5–15.5)
RDW: 14.3 % (ref 11.5–15.5)
RDW: 14.4 % (ref 11.5–15.5)
WBC: 5.4 K/uL (ref 4.0–10.5)
WBC: 5.6 K/uL (ref 4.0–10.5)
WBC: 6.8 10*3/uL (ref 4.0–10.5)
WBC: 8.9 10*3/uL (ref 4.0–10.5)

## 2010-05-31 LAB — CARDIAC PANEL(CRET KIN+CKTOT+MB+TROPI)
CK, MB: 0.5 ng/mL (ref 0.3–4.0)
CK, MB: 0.5 ng/mL (ref 0.3–4.0)
Relative Index: INVALID (ref 0.0–2.5)
Relative Index: INVALID (ref 0.0–2.5)
Total CK: 30 U/L (ref 7–177)
Total CK: 33 U/L (ref 7–177)
Troponin I: 0.01 ng/mL (ref 0.00–0.06)
Troponin I: 0.02 ng/mL (ref 0.00–0.06)

## 2010-05-31 LAB — URINALYSIS, ROUTINE W REFLEX MICROSCOPIC
Bilirubin Urine: NEGATIVE
Glucose, UA: NEGATIVE mg/dL
Hgb urine dipstick: NEGATIVE
Ketones, ur: NEGATIVE mg/dL
Nitrite: NEGATIVE
Nitrite: NEGATIVE
Protein, ur: NEGATIVE mg/dL
Protein, ur: NEGATIVE mg/dL
Specific Gravity, Urine: 1.007 (ref 1.005–1.030)
Specific Gravity, Urine: <1.005 — ABNORMAL LOW (ref 1.005–1.030)
Urobilinogen, UA: 0.2 mg/dL (ref 0.0–1.0)
Urobilinogen, UA: 0.2 mg/dL (ref 0.0–1.0)
pH: 5.5 (ref 5.0–8.0)

## 2010-05-31 LAB — COMPREHENSIVE METABOLIC PANEL WITH GFR
BUN: 11 mg/dL (ref 6–23)
CO2: 23 meq/L (ref 19–32)
Chloride: 101 meq/L (ref 96–112)
Creatinine, Ser: 1.07 mg/dL (ref 0.4–1.2)
GFR calc non Af Amer: 48 mL/min — ABNORMAL LOW (ref >60)
Glucose, Bld: 123 mg/dL — ABNORMAL HIGH (ref 70–99)
Total Bilirubin: 0.6 mg/dL (ref 0.3–1.2)

## 2010-05-31 LAB — MAGNESIUM: Magnesium: 1.8 mg/dL (ref 1.5–2.5)

## 2010-05-31 LAB — VANCOMYCIN, TROUGH
Vancomycin Tr: 13.5 ug/mL (ref 10.0–20.0)
Vancomycin Tr: 3.6 ug/mL — ABNORMAL LOW (ref 10.0–20.0)

## 2010-05-31 LAB — FERRITIN: Ferritin: 153 ng/mL (ref 10–291)

## 2010-05-31 LAB — CULTURE, BLOOD (ROUTINE X 2)
Culture: NO GROWTH
Report Status: 9092011

## 2010-05-31 LAB — FOLATE RBC: RBC Folate: 1270 ng/mL — ABNORMAL HIGH (ref 180–600)

## 2010-05-31 LAB — COMPREHENSIVE METABOLIC PANEL
ALT: 19 U/L (ref 0–35)
AST: 30 U/L (ref 0–37)
Albumin: 3.4 g/dL — ABNORMAL LOW (ref 3.5–5.2)
Alkaline Phosphatase: 55 U/L (ref 39–117)
Calcium: 8.9 mg/dL (ref 8.4–10.5)
GFR calc Af Amer: 59 mL/min — ABNORMAL LOW (ref >60)
Potassium: 3.8 mEq/L (ref 3.5–5.1)
Sodium: 133 mEq/L — ABNORMAL LOW (ref 135–145)
Total Protein: 7.1 g/dL (ref 6.0–8.3)

## 2010-05-31 LAB — CK TOTAL AND CKMB (NOT AT ARMC)
CK, MB: 0.6 ng/mL (ref 0.3–4.0)
Relative Index: INVALID (ref 0.0–2.5)

## 2010-05-31 LAB — TSH: TSH: 2.075 u[IU]/mL (ref 0.350–4.500)

## 2010-05-31 LAB — LIPASE, BLOOD: Lipase: 30 U/L (ref 11–59)

## 2010-05-31 LAB — VITAMIN B12: Vitamin B-12: 960 pg/mL — ABNORMAL HIGH (ref 211–911)

## 2010-05-31 LAB — TROPONIN I: Troponin I: 0.03 ng/mL (ref 0.00–0.06)

## 2010-06-01 ENCOUNTER — Encounter (HOSPITAL_BASED_OUTPATIENT_CLINIC_OR_DEPARTMENT_OTHER): Payer: Medicare Other | Admitting: Oncology

## 2010-06-01 ENCOUNTER — Other Ambulatory Visit: Payer: Self-pay | Admitting: Oncology

## 2010-06-01 ENCOUNTER — Encounter: Payer: Self-pay | Admitting: Internal Medicine

## 2010-06-01 DIAGNOSIS — C50919 Malignant neoplasm of unspecified site of unspecified female breast: Secondary | ICD-10-CM

## 2010-06-01 DIAGNOSIS — Z170 Estrogen receptor positive status [ER+]: Secondary | ICD-10-CM

## 2010-06-01 LAB — CBC WITH DIFFERENTIAL/PLATELET
Basophils Absolute: 0 10*3/uL (ref 0.0–0.1)
EOS%: 0.4 % (ref 0.0–7.0)
LYMPH%: 21.4 % (ref 14.0–49.7)
MCH: 32 pg (ref 25.1–34.0)
MCV: 93.3 fL (ref 79.5–101.0)
MONO%: 10.3 % (ref 0.0–14.0)
Platelets: 146 10*3/uL (ref 145–400)
RBC: 3.82 10*6/uL (ref 3.70–5.45)
RDW: 13.9 % (ref 11.2–14.5)

## 2010-06-01 LAB — COMPREHENSIVE METABOLIC PANEL
AST: 18 U/L (ref 0–37)
Albumin: 3.8 g/dL (ref 3.5–5.2)
Alkaline Phosphatase: 82 U/L (ref 39–117)
BUN: 14 mg/dL (ref 6–23)
Glucose, Bld: 132 mg/dL — ABNORMAL HIGH (ref 70–99)
Potassium: 3.8 mEq/L (ref 3.5–5.3)
Sodium: 135 mEq/L (ref 135–145)
Total Bilirubin: 0.4 mg/dL (ref 0.3–1.2)

## 2010-06-05 ENCOUNTER — Encounter: Payer: Self-pay | Admitting: Internal Medicine

## 2010-06-05 DIAGNOSIS — E039 Hypothyroidism, unspecified: Secondary | ICD-10-CM | POA: Insufficient documentation

## 2010-06-05 DIAGNOSIS — C50919 Malignant neoplasm of unspecified site of unspecified female breast: Secondary | ICD-10-CM | POA: Insufficient documentation

## 2010-06-05 DIAGNOSIS — Z860100 Personal history of colon polyps, unspecified: Secondary | ICD-10-CM | POA: Insufficient documentation

## 2010-06-05 NOTE — Progress Notes (Signed)
Summary: Triage  Phone Note Call from Patient Call back at Home Phone [phone]   Caller: Daughter Elease Hashimoto Call For: Dr. Juanda Chance Reason for Call: Talk to Nurse Summary of Call: Has questions about her Capsule Endo Initial call taken by: Karna Christmas,  May 24, 2010 1:53 PM  Follow-up for Phone Call        Patient's daughter calling to report her mother is not sure if she passed the capsule from the capsule endo testing. The patient has also found that she has breast cancer. Please, advise. Follow-up by: Jesse Fall RN,  May 24, 2010 2:03 PM  Additional Follow-up for Phone Call Additional follow up Details #1::        please obtain KUB on Monday 05/28/2010 if she does not see it in the stool by then. "r/o retained small bowl capsule" Additional Follow-up by: Hart Carwin MD,  May 24, 2010 2:10 PM    Additional Follow-up for Phone Call Additional follow up Details #2::    Patient's daughter aware. She will call on Monday and let me know if patient passed the capsule. Follow-up by: Jesse Fall RN,  May 24, 2010 2:33 PM  Additional Follow-up for Phone Call Additional follow up Details #3:: Details for Additional Follow-up Action Taken: Message left for patients daughter to call back. Jesse Fall, RN 05/28/10 11:17 AM Spoke with patients daughter. She does not know if her mother passed the capsule. She will bring her in for a KUB. Left message for her to bring her today. Jesse Fall, RN 05/28/10 12:00 PM

## 2010-06-05 NOTE — Procedures (Addendum)
Summary: CAPSULE ENDOSCOPY REPORT    ORDERED BY: [name], MD READ BY: Claudette Head, MD  REASON FOR REFERRAL: 77 YO FEMALE WITH LOWER ABDOMINAL PAIN AND ENLARGING MESENTERIC MASS? SMALL BOWEL INVOLVEMENT.  PATIENT DATA: Height: 62.0inches. Weight: 136lbs. Waist: 0.0inches. Build: Normal. Gastric Passage Time: 0h 84m. Small Bowel Passage Time: 5h 36m.  PROCEDURE INFO & FINDINGS: 1) COMPLETE STDY 2) MASS PROTRUDING INTO LUMEN STARTING AT 3 HOURS 39 MINUTES, OCCUPYING 1/3 OF LUMEN. 3) SEPERATE SEGMENT ? OF BOWEL WITH MASS STARTING AT 4 HOURS 13 MINUTES. CAPSULE REMAINS IN THIS SEGMENT FOR OVER AN HOUR AND A HALF, EVENTUALLY PASSES INTO CECUM.  SUMMARY & RECOMMENDATIONS: PER DR. Juanda Chance, APPEARSS TO HAVE TUMOR IN SMALL BOWEL, PROBABLY IN 2 SEPERATE SEGMENTS, NO OBSTRUCTION, BUT PROLINGED RETENTION IN MORE DISTAL AREA OF TUMOR.  Appended Document: CAPSULE ENDOSCOPY REPORT please call pt with results of the SBCE, shows  growth in the small intestine. Also urine collection for HIAA was slightly abnorma. Please arrange for an OV to discuss, no emergency.  Appended Document: CAPSULE ENDOSCOPY REPORT Spoke with patients daughter and she is aware of Dr. Regino Schultze response. Daughter states that her mother has been diagnosed with Breast cancer and is seeing the oncologist today at the cancer center. Daughter states that she will call back to schedule an appointment with Dr. Juanda Chance once they find out what is happening with Breast cancer.  Appended Document: CAPSULE ENDOSCOPY REPORT reviewed and agree. The breast cancer takes priority.  Appended Document: CAPSULE ENDOSCOPY REPORT Spoke with patients duaghter Elease Hashimoto and scheduled patient on 06/13/10 at 2:45 PM to see Dr. Juanda Chance.

## 2010-06-06 ENCOUNTER — Ambulatory Visit: Payer: Medicare Other

## 2010-06-06 ENCOUNTER — Encounter: Payer: Self-pay | Admitting: Internal Medicine

## 2010-06-06 ENCOUNTER — Other Ambulatory Visit: Payer: Self-pay | Admitting: Internal Medicine

## 2010-06-06 ENCOUNTER — Ambulatory Visit (INDEPENDENT_AMBULATORY_CARE_PROVIDER_SITE_OTHER): Payer: Medicare Other | Admitting: Internal Medicine

## 2010-06-06 DIAGNOSIS — E039 Hypothyroidism, unspecified: Secondary | ICD-10-CM

## 2010-06-06 DIAGNOSIS — E785 Hyperlipidemia, unspecified: Secondary | ICD-10-CM

## 2010-06-06 DIAGNOSIS — F411 Generalized anxiety disorder: Secondary | ICD-10-CM

## 2010-06-06 DIAGNOSIS — I10 Essential (primary) hypertension: Secondary | ICD-10-CM

## 2010-06-06 DIAGNOSIS — N390 Urinary tract infection, site not specified: Secondary | ICD-10-CM

## 2010-06-06 DIAGNOSIS — C50919 Malignant neoplasm of unspecified site of unspecified female breast: Secondary | ICD-10-CM

## 2010-06-06 LAB — URINALYSIS, ROUTINE W REFLEX MICROSCOPIC
Hgb urine dipstick: NEGATIVE
Total Protein, Urine: NEGATIVE
Urine Glucose: NEGATIVE
Urobilinogen, UA: 0.2 (ref 0.0–1.0)

## 2010-06-06 LAB — TSH: TSH: 4.9 u[IU]/mL (ref 0.35–5.50)

## 2010-06-06 LAB — LIPID PANEL: Total CHOL/HDL Ratio: 2

## 2010-06-06 MED ORDER — ALPRAZOLAM 0.25 MG PO TABS: 0.2500 mg | ORAL_TABLET | Freq: Every evening | ORAL | Status: DC | PRN

## 2010-06-06 NOTE — Assessment & Plan Note (Signed)
Recurrent dysuria - may be related to other abd pain and new dx SB cancer - check UA/cx r/o other infx complications pain symptoms

## 2010-06-06 NOTE — Progress Notes (Signed)
Addended by: Orlan Leavens on: 06/06/2010 02:30 PM   Modules accepted: Orders

## 2010-06-06 NOTE — Assessment & Plan Note (Signed)
Meds/labs reviewed -

## 2010-06-06 NOTE — Progress Notes (Signed)
Subjective:    Patient ID: Katelyn [name], female    DOB: [birth year], 77 y.o.   MRN: [id number]  HPI Since last visit - dx with recurrent breast ca on R and med change from tamoxifen to armidex by onc No surg planned for breast ca due to other new dx SB cancer...  Also dx GI cancer 04/2010 - endo/Scap endo reports reviewed - felt to be cause of abd/suprapubic pain - followup with GI/surg planned in near future  also reviewed chronic med issues - HTN - reports compliance with ongoing medical treatment. denies adverse side effects related to current therapy. prev changed toprol xr to two times a day for cost concerns  hypothyroid - has followed with endo for same but would like to follow here - reports compliance with ongoing medical treatment but freq changes in medication dose or frequency. denies adverse side effects related to current therapy.   breast cancer hx - see above, R side recurrence dx 04/2010 -prev on tamoxifen and follows q6-68mo with onc for same  dyslipidemia - reports compliance with ongoing medical treatment and no changes in medication dose or frequency. denies adverse side effects related to current therapy.  Past Medical History  Diagnosis Date  . Hypertension   . Hypothyroidism   . Hx of colonic polyps     RLQ mensenteric mass-1.4cmx2.1cm CT 02/20/10 ? Carcinoid  . Hyperlipidemia   . Depression 08-14-99    following death of spouse  . Breast cancer 1995    (R) lumpectomy, XT and tomifen- Aug 14, 2003 (L) partial mastectomy with tamoxifen; recurr R 04/2010  . GI cancer 04/2010   History   Social History  . Marital Status: Widowed    Spouse Name: N/A    Number of Children: N/A  . Years of Education: N/A   Occupational History  . Not on file.   Social History Main Topics  . Smoking status: Never Smoker   . Smokeless tobacco: Not on file  . Alcohol Use: No  . Drug Use: No  . Sexually Active:    Other Topics Concern  . Not on file   Social History Narrative   Widowed, lives alone but supportive children (4 in town). Has sitter/aide M-F to assist with chores, shopping/transport, comp   Current Outpatient Prescriptions on File Prior to Visit  Medication Sig Dispense Refill  . ALPRAZolam (NIRAVAM) 0.25 MG dissolvable tablet Take 0.25 mg by mouth at bedtime as needed. Take 1-2 by mouth at bedtime as needed       . amLODipine (NORVASC) 5 MG tablet Take 5 mg by mouth daily.        . Calcium-Vitamin D-Iron (CALCIUM 600 IRON/D) 409-811-91 MG-UNIT-MG TABS Take by mouth daily.        Marland Kitchen darifenacin (ENABLEX) 7.5 MG 24 hr tablet Take 7.5 mg by mouth at bedtime.        Marland Kitchen levothyroxine (SYNTHROID, LEVOTHROID) 50 MCG tablet Take 50 mcg by mouth daily.        . metoprolol (LOPRESSOR) 25 MG tablet Take 25 mg by mouth 2 (two) times daily.        . Multiple Vitamins-Minerals (CENTRUM SILVER) tablet Take 1 tablet by mouth daily.        Marland Kitchen omeprazole (PRILOSEC) 40 MG capsule Take 40 mg by mouth daily.        . simvastatin (ZOCOR) 40 MG tablet Take 40 mg by mouth at bedtime.        Marland Kitchen DISCONTD: tamoxifen (NOLVADEX) 20  MG tablet Take 20 mg by mouth daily.           Review of Systems    Patient denies chest pain, edema or dyspnea on exertion. No headache, rash or abdominal pain. No specific complaints in a complete review of systems (except as listed above).  Objective:   Physical Exam  Constitutional: She is oriented to person, place, and time. She appears well-developed. No distress.       dtr at side.  HENT:  Head: Normocephalic and atraumatic.       HOH.  Eyes: Conjunctivae and EOM are normal. Pupils are equal, round, and reactive to light. No scleral icterus.  Neck: Normal range of motion. Neck supple. No thyromegaly present.  Cardiovascular: Normal rate, regular rhythm and normal heart sounds.   No murmur heard. Pulmonary/Chest: Effort normal and breath sounds normal. No respiratory distress. She has no wheezes.  Abdominal: Soft. Bowel sounds are normal.    Musculoskeletal: Normal range of motion.  Neurological: She is alert and oriented to person, place, and time. No cranial nerve deficit.  Skin: Skin is warm and dry.       Filed Vitals:   06/06/10 1040  BP: 120/72  Pulse: 65  Temp: 97.8 F (36.6 C)      Assessment & Plan:  See problem list. Medications and labs reviewed today.

## 2010-06-06 NOTE — Progress Notes (Signed)
Addended by: Cristy Hilts on: 06/06/2010 04:47 PM   Modules accepted: Orders

## 2010-06-06 NOTE — Assessment & Plan Note (Signed)
Clarify med - pill, not oral dissolve; no new refills today

## 2010-06-06 NOTE — Assessment & Plan Note (Signed)
Filed Vitals:   06/06/10 1040  BP: 120/72  Pulse: 65  Temp: 97.8 F (36.6 C)   At goal, meds/labs reviewed - cont same

## 2010-06-06 NOTE — Assessment & Plan Note (Signed)
Dx recurrent dz 04/2010 in right breast - med change reviewed - Cont to follow with onc as ongoing - felt GI cancer takes precedence due to associated abd pain

## 2010-06-06 NOTE — Assessment & Plan Note (Addendum)
Previously folllowed with endo (kumar) but last visit missed due to other appts Will check now  Lab Results  Component Value Date   TSH 4.6 02/01/2010

## 2010-06-06 NOTE — Patient Instructions (Signed)
Good to see you today. Test(s) ordered today. Your results will be called to you after review (48-72hours after test completion). If any changes need to be made, you will be notified at that time. Medications reviewed - no changes today. Call when/if refills needed.

## 2010-06-07 ENCOUNTER — Telehealth: Payer: Self-pay | Admitting: *Deleted

## 2010-06-07 NOTE — Telephone Encounter (Signed)
Message copied by Orlan Leavens on Thu Jun 07, 2010  3:19 PM ------      Message from: Rene Paci ANN      Created: Wed Jun 06, 2010  5:31 PM       Please call patient - normal results thus far (dirty urine catch, not true UTI - will await Ucx and call when final). No medication changes recommended. Thanks.

## 2010-06-08 ENCOUNTER — Telehealth: Payer: Self-pay | Admitting: *Deleted

## 2010-06-08 LAB — URINE CULTURE

## 2010-06-08 NOTE — Telephone Encounter (Signed)
Message copied by Orlan Leavens on Fri Jun 08, 2010  9:51 AM ------      Message from: Rene Paci ANN      Created: Caleen Essex Jun 08, 2010  9:34 AM       Please call patient - normal results on Ucx. No medication changes recommended. Thanks.

## 2010-06-08 NOTE — Telephone Encounter (Signed)
Notified pt daughter Elease Hashimoto) of results of urine cx...06/08/10@9 :51am/LMB

## 2010-06-13 ENCOUNTER — Ambulatory Visit (INDEPENDENT_AMBULATORY_CARE_PROVIDER_SITE_OTHER): Payer: Medicare Other | Admitting: Internal Medicine

## 2010-06-13 ENCOUNTER — Encounter: Payer: Self-pay | Admitting: Internal Medicine

## 2010-06-13 VITALS — BP 110/64 | HR 76 | Ht 62.0 in | Wt 133.0 lb

## 2010-06-13 DIAGNOSIS — K6389 Other specified diseases of intestine: Secondary | ICD-10-CM

## 2010-06-13 NOTE — Progress Notes (Signed)
Katelyn [name] May 28, 1918 MRN [id number]    History of Present Illness:  This is an 77 year old white female with a partial small bowel obstruction due to a speculated mass in the right lower quadrant which has grown in size from a CT scan in 07/31/04 measuring 0.9x1.3 cm to it's current size of 1.4x2.1 cm on the most recent CT scan in December 2011. A small bowel capsule endoscopy completed last month confirmed the mass to be protruding into the lumen of the distal small bowel with the capsule being held at the segment of the small bowel and subsequently passing. Her 24-hour urine for HIAA was slightly elevated at 8.2 mg per 24 hours, normal being less than 6 mg per 24 hours. She has daily pain in the right lower quadrant. The pain wakes her up at 3 in the morning on a daily basis. It disappears during the day. She has also noticed that her usual diarrhea has become much thicker and she is getting somewhat constipated. She was recently diagnosed with right-sided breast cancer; having a history of left-sided breast cancer before.The plan is to treat her with Anastrazol as per Dr Darnelle Catalan. There is a strong family history of colon cancer and a personal history of thyroid mass, small pulmonary nodule and a question of Parkinson's disease. She was treated for C. difficile colitis in the recent past.   Past Medical History  Diagnosis Date  . Hypertension   . Hypothyroidism   . Hx of colonic polyps     RLQ mensenteric mass-1.4cmx2.1cm CT 02/20/10 ? Carcinoid  . Hyperlipidemia   . Depression 08/01/1999    following death of spouse  . Breast cancer 1995    (R) lumpectomy, XT and tomifen- 01-Aug-2003 (L) partial mastectomy with tamoxifen; recurr R 04/2010  . GI cancer 04/2010  . Breast cancer 05/24/10    recurrence  . 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 Hiatal hernia   . Clostridium difficile colitis   . Anxiety   . Anemia   . Parkinson disease   . Hx of adenomatous colonic polyps 06/25/01  . GERD  (gastroesophageal reflux disease)    Past Surgical History  Procedure Date  . Abdominal hysterectomy 1969  . Back surgery 1959  . Breast surgery 1995    (R) Lumpectomy  . Mastectomy, partial 2005    left    reports that she has never smoked. She has never used smokeless tobacco. She reports that she does not drink alcohol or use illicit drugs. family history includes Breast cancer in her daughter, mother, and sister; Colon cancer in her father and mother; Colon polyps in her daughter and son; Hyperlipidemia in her mother; Hypertension in her mother; and Prostate cancer in her father. No Known Allergies      Review of Systems: Positive for abdominal pain, and difficulty sleeping because of the pain. She denies chest pain, shortness of breath or dysphagia, weight has been stable. She denies rectal bleeding. Numerous colonoscopies in the past starting in 1989/07/31 showed polyp. There was subsequent diverticulosis with her last colonoscopy just recently.  The remainder of the 10  point ROS is negative except as outlined in H&P   Physical Exam: General appearance  Well developed, in no distress. Eyes- non icteric. HEENT nontraumatic, normocephalic. Mouth no lesions, tongue papillated, no cheilosis. Neck supple without adenopathy, thyroid not enlarged, no carotid bruits, no JVD Lungs Clear to auscultation bilaterally. Cor normal S1 normal S2, regular rhythm , no  murmur,  quiet precordium. Abdomen soft nontender somewhat protuberant. Marked tenderness in the right lower quadrant but no rebound. No palpable mass. Extremities no pedal edema. Skin no lesions. Neurological alert and oriented x 3. Psychological normal mood and Affect.  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 The mass seems to be symptomatic in that she is having pain on a daily basis and may be  getting partially obstructed as evidenced by retained capsule at the site of the tumor. At her age of 30, she is naturally a high risk for general anesthesia but she has never had any heart or pulmonary disease. I would recommend this tumor to be removed surgically, hopefully by laproscopically assisted  Resection.   Problem #2 family history of colon cancer. She is up-to-date on her colonoscopy.  Problem #3 elevated the 24-hour urine HIAA- suggestive of carcinoid. There is no evidence of metastatic disease at this time. We will make we will make a surgical referral to Dr. Johna Sheriff.  06/13/2010 Katelyn [name]

## 2010-06-13 NOTE — Patient Instructions (Signed)
You have been scheduled for an appointment with Dr B.Hoxworth at [facility] on June 27, 2010 (Wednesday) @ 3:30 pm. You should arrive at 3:15 pm for registration. CC: Dr Glenna Fellows

## 2010-06-14 NOTE — Letter (Signed)
Summary: [facility]  [facility]   Imported By: Lamona Curl CMA (AAMA) 06/08/2010 12:19:58  _____________________________________________________________________  External Attachment:    Type:   Image     Comment:   External Document

## 2010-06-14 NOTE — Letter (Signed)
Summary: [facility]  [facility]   Imported By: Lamona Curl CMA (AAMA) 06/08/2010 12:21:24  _____________________________________________________________________  External Attachment:    Type:   Image     Comment:   External Document

## 2010-06-14 NOTE — Letter (Signed)
Summary: [facility]  [facility]   Imported By: Lamona Curl CMA (AAMA) 06/08/2010 12:20:55  _____________________________________________________________________  External Attachment:    Type:   Image     Comment:   External Document

## 2010-06-14 NOTE — Letter (Signed)
Summary: [facility]  [facility]   Imported By: Lamona Curl CMA (AAMA) 06/08/2010 12:22:00  _____________________________________________________________________  External Attachment:    Type:   Image     Comment:   External Document

## 2010-06-25 LAB — DIFFERENTIAL
Eosinophils Absolute: 0 10*3/uL (ref 0.0–0.7)
Eosinophils Relative: 0 % (ref 0–5)
Lymphocytes Relative: 14 % (ref 12–46)
Lymphs Abs: 1.1 10*3/uL (ref 0.7–4.0)
Monocytes Absolute: 1 10*3/uL (ref 0.1–1.0)

## 2010-06-25 LAB — BASIC METABOLIC PANEL
BUN: 16 mg/dL (ref 6–23)
CO2: 26 mEq/L (ref 19–32)
CO2: 29 mEq/L (ref 19–32)
CO2: 29 mEq/L (ref 19–32)
CO2: 29 mEq/L (ref 19–32)
Calcium: 9.2 mg/dL (ref 8.4–10.5)
Chloride: 101 mEq/L (ref 96–112)
Chloride: 89 mEq/L — ABNORMAL LOW (ref 96–112)
Chloride: 93 mEq/L — ABNORMAL LOW (ref 96–112)
Chloride: 94 mEq/L — ABNORMAL LOW (ref 96–112)
Chloride: 99 mEq/L (ref 96–112)
Creatinine, Ser: 0.81 mg/dL (ref 0.4–1.2)
Creatinine, Ser: 1.06 mg/dL (ref 0.4–1.2)
GFR calc Af Amer: 59 mL/min — ABNORMAL LOW (ref >60)
GFR calc Af Amer: >60 mL/min (ref >60)
GFR calc Af Amer: >60 mL/min (ref >60)
GFR calc non Af Amer: 52 mL/min — ABNORMAL LOW (ref >60)
Glucose, Bld: 100 mg/dL — ABNORMAL HIGH (ref 70–99)
Glucose, Bld: 109 mg/dL — ABNORMAL HIGH (ref 70–99)
Glucose, Bld: 131 mg/dL — ABNORMAL HIGH (ref 70–99)
Potassium: 3.5 mEq/L (ref 3.5–5.1)
Potassium: 3.6 mEq/L (ref 3.5–5.1)
Potassium: 3.6 mEq/L (ref 3.5–5.1)
Sodium: 131 mEq/L — ABNORMAL LOW (ref 135–145)

## 2010-06-25 LAB — POCT I-STAT, CHEM 8
BUN: 13 mg/dL (ref 6–23)
Chloride: 93 mEq/L — ABNORMAL LOW (ref 96–112)
Creatinine, Ser: 0.9 mg/dL (ref 0.4–1.2)
Potassium: 3.7 mEq/L (ref 3.5–5.1)
Sodium: 126 mEq/L — ABNORMAL LOW (ref 135–145)

## 2010-06-25 LAB — URINALYSIS, ROUTINE W REFLEX MICROSCOPIC
Bilirubin Urine: NEGATIVE
Glucose, UA: NEGATIVE mg/dL
Hgb urine dipstick: NEGATIVE
Ketones, ur: NEGATIVE mg/dL
Protein, ur: NEGATIVE mg/dL
Protein, ur: NEGATIVE mg/dL
Urobilinogen, UA: 0.2 mg/dL (ref 0.0–1.0)

## 2010-06-25 LAB — CBC
HCT: 37.2 % (ref 36.0–46.0)
HCT: 41.4 % (ref 36.0–46.0)
Hemoglobin: 14 g/dL (ref 12.0–15.0)
Hemoglobin: 14.2 g/dL (ref 12.0–15.0)
MCV: 94.8 fL (ref 78.0–100.0)
MCV: 94.9 fL (ref 78.0–100.0)
Platelets: 110 10*3/uL — ABNORMAL LOW (ref 150–400)
RBC: 3.92 MIL/uL (ref 3.87–5.11)
RBC: 4.25 MIL/uL (ref 3.87–5.11)
WBC: 6.6 10*3/uL (ref 4.0–10.5)
WBC: 6.9 10*3/uL (ref 4.0–10.5)
WBC: 7.9 10*3/uL (ref 4.0–10.5)

## 2010-06-25 LAB — HEPATIC FUNCTION PANEL
ALT: 19 U/L (ref 0–35)
AST: 28 U/L (ref 0–37)
Albumin: 3.3 g/dL — ABNORMAL LOW (ref 3.5–5.2)
Alkaline Phosphatase: 56 U/L (ref 39–117)
Total Bilirubin: 0.7 mg/dL (ref 0.3–1.2)
Total Protein: 6.7 g/dL (ref 6.0–8.3)

## 2010-06-25 LAB — THYROID ANTIBODIES
Thyroglobulin Ab: 3610.5 U/mL — ABNORMAL HIGH (ref 0.0–60.0)
Thyroperoxidase Ab SerPl-aCnc: 2571.1 U/mL — ABNORMAL HIGH (ref 0.0–60.0)

## 2010-06-25 LAB — URINE MICROSCOPIC-ADD ON

## 2010-06-25 LAB — POCT CARDIAC MARKERS: Troponin i, poc: <0.05 ng/mL (ref 0.00–0.09)

## 2010-06-25 LAB — URINE CULTURE: Colony Count: NO GROWTH

## 2010-06-25 LAB — SODIUM: Sodium: 128 mEq/L — ABNORMAL LOW (ref 135–145)

## 2010-07-25 ENCOUNTER — Other Ambulatory Visit: Payer: Self-pay | Admitting: *Deleted

## 2010-07-25 MED ORDER — OMEPRAZOLE 40 MG PO CPDR: 40.0000 mg | DELAYED_RELEASE_CAPSULE | Freq: Every day | ORAL | Status: DC

## 2010-07-31 ENCOUNTER — Other Ambulatory Visit: Payer: Self-pay | Admitting: *Deleted

## 2010-07-31 MED ORDER — AMLODIPINE BESYLATE 5 MG PO TABS: 5.0000 mg | ORAL_TABLET | Freq: Every day | ORAL | Status: DC

## 2010-07-31 MED ORDER — OMEPRAZOLE 40 MG PO CPDR: 40.0000 mg | DELAYED_RELEASE_CAPSULE | Freq: Every day | ORAL | Status: DC

## 2010-07-31 NOTE — Discharge Summary (Signed)
NAME:  Katelyn [name], Katelyn [name] NO.:  [id number]   MEDICAL RECORD NO.:  [id number]          PATIENT TYPE:  INP   LOCATION:  1343                         FACILITY:  [facility]   PHYSICIAN:  Zannie Cove, MD     DATE OF BIRTH:  Nov 04, 1918   DATE OF ADMISSION:  09/24/2008  DATE OF DISCHARGE:                               DISCHARGE SUMMARY   PRIMARY CARE PHYSICIAN:  Dr. Burton Apley.   ENDOCRINOLOGIST:  Dr. Reather Littler.   DISCHARGE DIAGNOSES:  1. Cough due to tracheal compression from thyroid mass.  2. Thyroid mass.  3. Hyponatremia secondary to SIADH resolved.  4. Altered mental status secondary to hyponatremia, resolved.  5. 7 mm pulmonary nodule.  6. Hypothyroidism.  7. History of multinodular goiter.  8. Hypertension.  9. Dyslipidemia.  10.Gastroesophageal reflux disease.   DISCHARGE MEDICATIONS:  1. Tamoxifen 20 mg once daily.  2. Simvastatin 40 mg once daily.  3. Bupropion 150 mg once daily.  4. Nexium 40 mg once daily.  5. Synthroid 50 mcg or 0.05 mg once daily.  6. Calcium citrate one tablet once daily.  7. Norvasc 5 mg once daily.  8. Multivitamins one tablet once daily.  9. Robitussin 200 mg p.o. b.i.d.   CONSULTATIONS:  1. Pulmonology, Dr. Sherene Sires.  2. Endocrinology, Dr. Sharl Ma.   DIAGNOSTICS AND INVESTIGATIONS:  1. Chest x-ray September 24, 2008.  No acute cardiopulmonary disease.  No      focal airspace disease, no effusion or bony abnormalities.  2. CT head, no acute abnormalities.  3. CT chest shows a 7 mm ground-glass nodule in the right lower lobe      of the lung, most likely representing focal fibrosis with scarring      however malignancy cannot be excluded. Also a left thyroid mass      which was 2.5 x 2.7 cm which extends into the thoracic inlet.  4. Thyroid ultrasound on September 25, 2008, showed a heterogeneous mass of      the lower pole of the left lobe of the thyroid that could not be      further characterized.  It measures 3.8 x 2.2 x 2.8 cm and  a 0.9 cm      lesion with some internal echo but increased  through transmission      as seen at the lower pole of the right lobe which may be a cyst      containing debris.   HOSPITAL COURSE:  Katelyn [name] is an 77 year old female from home who is  admitted with:  1. Altered mental status secondary to SIADH.  This improved with fluid      restriction.  2. Hyponatremia.  Was secondary to SIADH from pulmonary process as      well as severe cough.  Her fluid was restricted and sodium levels      improved to normal.  3. Cough.  She had persistent cough which was initially treated for      bronchitis as outpatient.  However, her symptoms did not improve      which led to the CT  scan of her chest which showed the above      mentioned thyroid mass as well as a 7 mm lung nodule.  The cough is      thought to be secondary to tracheal compression.  Dr. Sherene Sires from      Pulmonary saw the patient as well, and he concurred with this being      a cough secondary to compressive symptoms from the thyroid gland.  4. Thyroid mass.  She was found to have a 3.8 x 2.2 x 2.8      heterogeneous mass in the left pole of the left lobe of the thyroid      gland per ultrasound and CT scan.  Dr. Sharl Ma was consulted from      endocrinology, recommended possible biopsy as well as antibody      studies.  She had a free T4 measured at 0.67, free T3 was 2.1,      Thyroglobulin antibodies were elevated at 3610 and thyroid      peroxidase antibodies also elevated at 2571.  However, on July 12,      I had a telephone conversation with Dr. Reather Littler who said that he      was aware of this multinodular goiter of the thyroid gland and this      had been biopsied, however, back in 1997, and found to have no      evidence of malignancy at that time and was unchanged on follow up      imaging studies, that he would do the necessary workup as needed.      Hence, further evaluation of thyroid mass is deferred to her      primary  endocrinologist, Dr. Reather Littler. Family and patient aware      and agreeable to this plan.  He will see her in clinic on September 29, 2008, at 9:00 a.m.  5. Pulmonary nodule.  Dr. Sherene Sires from Pulmonary was consulted and      thought that the tiny density in the right lower lobe seen by CT      scan was not clinically significant, although, there is a remote      possibility it could represent a very early malignancy or      metastasis from breast cancer, but he did not think it would affect      her prognosis and recommended a follow up chest x-ray in one years      time, and all this was discussed with the family.   CONDITION ON DISCHARGE:  Stable.  The patient was seen by PT/OT who  recommended short-term skilled nursing facility for her.   DISCHARGE INSTRUCTIONS:  1. Diet.  Low-sodium.  2. Activity.  As tolerated.   Follow up.  The patient is to follow up with  1. Primary care physician, Dr. Burton Apley in one week.  2. Dr. Reather Littler, Endocrinology on September 29, 2008, at 9:00 a.m.      Zannie Cove, MD  Electronically Signed     PJ/MEDQ  D:  09/27/2008  T:  09/27/2008  Job:  [id number]   cc:   Antony Madura, M.D.  Fax: [phone]   Reather Littler, M.D.  Fax: [phone]   Charlaine Dalton. Sherene Sires, MD, FCCP  [address]  [place] [address]

## 2010-07-31 NOTE — H&P (Signed)
NAME:  Katelyn, [name] NO.:  [id number]   MEDICAL RECORD NO.:  [id number]          PATIENT TYPE:  INP   LOCATION:  1343                         FACILITY:  [facility]   PHYSICIAN:  Zannie Cove, MD     DATE OF BIRTH:  [birth year]   DATE OF ADMISSION:  09/24/2008  DATE OF DISCHARGE:                              HISTORY & PHYSICAL   PRIMARY CARE PHYSICIAN:  Dr. Leanord Hawking of [facility]   CHIEF COMPLAINT:  Altered mental status.   HISTORY OF PRESENT ILLNESS:  Katelyn [name] is an 77 year old white female  who was brought to the emergency department by her family due to change  in sensorium and not being herself for the past 3 days.  Apparently, the  patient has been unwell for the past 2-3 weeks with worsening cough with  occasional whitish sputum production for the past 2-3 weeks which has  not gotten better.  Actually causes her to cough so much that she  becomes completely red and dizzy from it after a while.  She was treated  by her primary care physician for bronchitis a couple of weeks ago  without any improvement.  In the last 3 days, she has been noted to be  coughing a lot, in addition, also restless and unsteady on her feet,  unable to carry a conversation and confused at home which is completely  different from her prior baseline of being an independent person.  In  addition, she was also feeling dizzy and unsteady.  Hence, was started  on meclizine 2 days ago by her primary care physician.  No history of  fevers or chills.  No weight loss.   PAST MEDICAL HISTORY:  1. Hypertension.  2. Hypothyroidism.  3. Dyslipidemia.  4. Acid reflux.   PAST SURGICAL HISTORY:  1. Hysterectomy with BSO.  2. Mastectomy.   MEDICATIONS:  1. Meclizine 25 p.o. q.i.d.; then 20 once a day.  2. Simvastatin 40 once a day.  3. Bupropion 150 once a day.  4. Nexium 40 once a day.  5. Synthroid 25 mcg once a day.  6. Calcium citrate once a day.  7. Norvasc 5 mg once a day.  8. Multivitamins once a day.   ALLERGIES:  NO KNOWN DRUG ALLERGIES.   SOCIAL HISTORY:  Lives at home by herself.  Used to be independent in  ADLs.  Denies any alcohol or smoking.   FAMILY HISTORY:  Noncontributory.   REVIEW OF SYSTEMS:  A 12-system reviewed negative except as per HPI.   PHYSICAL EXAMINATION:  VITAL SIGNS:  Temperature 97.3, pulse 78, blood  pressure 119/63, respirations 20, satting 97% on 2 liters.  GENERAL:  She is awake, talking, but confused.  HEENT:  No pallor.  No icterus.  No JVD.  Moist mucous membranes.  CARDIOVASCULAR:  S1 and S2, regular rate and rhythm.  LUNGS:  Occasional scattered rhonchi.  ABDOMEN:  Soft, nontender with positive bowel sounds.  EXTREMITIES:  No clubbing, cyanosis, or edema.  NEURO:  Intermittently confused.  However, moving all extremities.  Plantar withdrawal.  Reflexes are 2+.  LABORATORY DATA:  CBC within normal limits.  Chemistry:  Sodium 126,  prior sodiums have been in the 140-141 range, potassium 4.0, chloride  93, bicarb 25, BUN 13, creatinine 0.9, glucose 106.  LFTs normal.  ICAL  1.05, CK-MB less than 1, troponin less than 0.05.  UA is normal.   DIAGNOSTICS:  1. EKG is normal sinus rhythm rate of 76.  Left axis deviation.  No      acute ST changes.  2. Chest x-ray no acute process.  3. CT head with volume loss.  No acute changes.   ASSESSMENT/PLAN:  An 77 year old female with:  1. Hyponatremia.  She seems clinically euvolemic.  I suspect this is      the cause of her altered mental status which is likely from      syndrome of inappropriate secretion of antidiuretic hormone.  We      will check urine sodium and osmolarity to confirm.  Monitor I's and      O's.  Check daily weights.  Syndrome of inappropriate secretion of      antidiuretic hormone could possibly be secondary to pulmonary      process since she has been having ongoing chronic worsening cough      and bronchitis for the last 2-3 weeks.  We will check a CT  of her      chest to determine the etiology of her cough.  We will fluid restrict her to 1,000 mL per day.  1. Altered mental status most likely secondary to above.  Other      preliminary workup has been unremarkable.  It is possible the      meclizine is contributing to it.  Hence, we will stop it.  Also      check TSH and B12 level.  2. Bronchitis with persistent severe cough: Start Avelox 400 once a      day and nebs p.r.n.  Check CT chest.  3. Hypertension, stable.  Continue metoprolol and Norvasc.  4. Deep venous thrombosis prophylaxis with Lovenox.  5. Social service consult for possible placement upon discharge.   CODE STATUS:  The patient is a full code.      Zannie Cove, MD  Electronically Signed     PJ/MEDQ  D:  09/24/2008  T:  09/24/2008  Job:  [id number]

## 2010-07-31 NOTE — H&P (Signed)
NAME:  Katelyn [name], Katelyn [name] NO.:  [id number]   MEDICAL RECORD NO.:  [id number]          PATIENT TYPE:  EMS   LOCATION:  MAJO                         FACILITY:  [facility]   PHYSICIAN:  Wilson Singer, M.D.DATE OF BIRTH:  1918/08/26   DATE OF ADMISSION:  05/29/2007  DATE OF DISCHARGE:                              HISTORY & PHYSICAL   HISTORY:  This is a very pleasant 77 year old lady who lives at her own  home and who has Parkinson's disease.  Yesterday evening she fell in her  bathroom and sustained injury to the left side of her head, left elbow  and left chest area.  She did not lose consciousness.  She does not know  why she fell, but she clearly did not have any dizziness or  lightheadedness, chest pain or palpitations prior to the fall.  She has  been evaluated in the emergency room, and a scalp laceration has been  sutured.  She has been found to have a left fourth rib fracture,  nondisplaced, and unfortunately because of severe pain she is unable to  go home because of minimal movement causing severe pain.   PAST MEDICAL HISTORY:  1. Carcinoma of the left breast, which resulted in left partial      mastectomy in February 2007.  Three out of three lymph nodes      locally were negative.  She also says she has had a right      lumpectomy and this also showed malignancy, but there was no      further treatment apart from radiotherapy done.  2. Hypertension.  3. Parkinson's disease.  4. Hypothyroidism.  5. Hyperlipidemia.   PAST SURGICAL HISTORY:  Mastectomy as mentioned above, back surgery in  the 1940s,  hysterectomy and bisalpingo-oophorectomy in the past.   SOCIAL HISTORY:  She is a widow and has been so for the last 8 years.  She lives alone in her own home.  She does not smoke and does not drink  alcohol.   MEDICATIONS:  Synthroid 25 mcg daily, metoprolol, bupropion, Sinemet,  simvastatin,  tamoxifen and Diovan, all doses unclear.   ALLERGIES:   None.   REVIEW OF SYSTEMS:  Apart from the symptoms mentioned above, there are  no other symptoms, in particular cough, fever or shortness of breath.   PHYSICAL EXAMINATION:  VITAL SIGNS:  Temperature 97.2, blood pressure  166/52, pulse 80, respiratory rate 12 to 14, saturation 96% on room air.  She does not look in any respiratory distress.  There is no peripheral  or central cyanosis.  CARDIOVASCULAR:  Heart sounds are present and normal without murmurs.  RESPIRATORY:  Lung fields are clear anteriorly.  She clearly has chest  wall tenderness on the left side.  ABDOMEN:  Soft, nontender with no hepatosplenomegaly.  NEUROLOGICAL:  She does not have any focal neurologic signs, and she has some features  of parkinsonism.   INVESTIGATIONS:  Chest x-ray confirms the presence of a nondisplaced  left fourth rib.  She also had a neck x-ray which showed spondylosis but  no other acute findings.  Lab work is unavailable at the present time   IMPRESSION:  1. Left rib fracture, nondisplaced.  2. Parkinson's disease.  3. Hypertension.  4. Hypothyroidism.   PLAN:  1. Admit.  2. Analgesics around-the-clock.  3. Physical and occupational therapy consults.   Hopefully she can go home soon in the next 24 to 36 hours depending  on  level of pain control.      Wilson Singer, M.D.  Electronically Signed     NCG/MEDQ  D:  05/29/2007  T:  05/30/2007  Job:  [id number]   cc:   Quita Skye. Artis Flock, M.D.

## 2010-07-31 NOTE — Consult Note (Signed)
NAME:  Katelyn [name], Katelyn [name] NO.:  [id number]   MEDICAL RECORD NO.:  [id number]          PATIENT TYPE:  INP   LOCATION:  1343                         FACILITY:  [facility]   PHYSICIAN:  Tonita Cong, M.D.  DATE OF BIRTH:  [birth year]   DATE OF CONSULTATION:  09/26/2008  DATE OF DISCHARGE:                                 CONSULTATION   REQUESTING PHYSICIAN:  Jomarie Longs with [facility].   CHIEF COMPLAINT:  Cough.   HISTORY OF PRESENT ILLNESS:  Ms. Katelyn [name] is an 89-year of age  lady.  She describes a cough that began 2-3 weeks ago.  Moderate in  severity.  Precipitating factors include recent bronchitis.  Associated  symptoms include tickle in my throat.  She also has a history of  invasive ductal carcinoma of the breast with radiation therapy.  She had  a history of hyperthyroidism with postablative hypothyroidism after 29.8  mCi of radioactive iodine I-131 on Aug 05, 2006.  No family history of  thyroid cancer.  She does not recall fine-needle aspiration of her right lobe thyroid  nodule.  She notes that she is under the care of endocrinologist, Dr.  Reather Littler.  In fact, she reports that her next visit is scheduled for  September 26, 2008 (today).   PAST MEDICAL HISTORY:  Breast cancer, postablative hypothyroidism,  dyslipidemia, and gastroesophageal reflux disease.   Medication list reviewed includes Synthroid 25 mcg 1 tablet by mouth  once a day at home as well as a proton pump inhibitor and multivitamin.  She notes that her daughter prepares her weekly pill organizer.  In the  hospital, her medicines include subcutaneous Lovenox 40 mg subcutaneous  every 24 hours.   SOCIAL HISTORY:  The patient lives alone with support from her family as  described above.   ALLERGIES:  No known drug allergies.   FAMILY MEDICAL HISTORY:  No family history of thyroid cancer.   REVIEW OF SYSTEMS:  The patient denies dyspnea while lying supine,  dysphagia, and hoarseness.   It is unclear from her description whether  she has odynophagia, however.   PHYSICAL EXAMINATION:  VITAL SIGNS:  Temperature 98.0, pulse 81,  respirations 20, blood pressure 120/68, weight 57.4 kg, and height 65  inches.  GENERAL:  No apparent distress, lying quietly and comfortably in bed at  the time of the exam.  NEUROLOGIC:  Mild tremor in both hands, 2+ deep tendon reflexes without  delay in relaxation phase.  Cranial nerves normal as tested.  Alert.  Exam suggestive of chronic dementia.  Also consistent with hearing  impairment.  HEAD, EARS, NOSE, AND THROAT:  As above including hearing impairment.  SKIN:  Appropriately warm and dry.  No visible rash.  CARDIOVASCULAR:  Regular rhythm and rate.  Full symmetric radial pulses.  No edema in the ankles.  NECK:  Thyroid: Nontender, mobile, asymmetric goiter, left lobe larger  than right lobe, but the thyroid is partially substernal, which limits  the exam.  Exam is also somewhat limited by her body position as she  tries to sit upright in the hospital  bed.  No Pemberton's sign.  RESPIRATORY:  Coarse breath sounds, symmetric.  ABDOMEN:  Active bowel sounds, normal pitch, soft, nontender.  No  appreciable organomegaly.  MENTAL STATUS:  As described above.   LABORATORY DATA:  TSH 10.5 microunits/mL which is high, free T4 of 0.67  ng/dL which is low, free T3 of 2.1 mcg/mL which is low, thyroid  peroxidase antibody positive at 2571 units/mL, thyroglobulin antibody  positive at 3610 units/mL.  Serum sodium 129 mEq/L.  CT scan of chest  with contrast revealed right tracheal deviation due to asymmetric goiter  with enlargement of the left lobe.  Thyroid ultrasound 3.8 x 3.2 x 3.8  cm heterogeneous mass in the lower pole of the left lobe.  0.9-cm cystic  nodule in the lower pole of the right lobe.  Images were reviewed today  as well as the radiology reports.  Thyroid uptake scan, heterogenous  uptake with a 26.7% in 24 hours with a large  cold nodule in the left  lobe on Aug 15, 2006.   ASSESSMENT AND PLAN:  1. Multinodular goiter.  2. Postablative hypothyroidism.  3. Hyponatremia.   The hyponatremia has been addressed by the internist on [facility].  If appropriate, consider cosyntropin stimulation test  to exclude adrenal insufficiency.  The hypothyroidism may be  contributing in part to her hyponatremia as well.  In regards to the  thyroid nodule, I recommend obtaining an ultrasound-guided fine-needle  aspiration of the 3.8-cm nodule in the left lobe if it has not already  been obtained.  I agree with the increase of Synthroid from 25 mcg each  day to 50 mcg by mouth each day with TSH and free T4 again in 6 weeks.  Strives for TSH between 0.3-2.0 microunits/mL especially since TSH may  stimulate growth of thyroid nodule and thyroid gland.  Recommend holding  Lovenox if possible before an ultrasound-guided final aspiration  attempt.  The ultrasound-guided fine aspiration of thyroid nodule may be  obtained as an outpatient.  Recommend she followup with Dr. Reather Littler,  her endocrinologist soon to address the thyroid nodule and to continue  managing her thyroid hormone therapy.   ADDENDUM: on 09/26/2008 after the consult visit, I spoke with Dr. Reather Littler, the patient's endocrinologist, and Dr. Jomarie Longs, the attending  hospitalist.  [name] had a previous fine needle aspiration of the  dominant thyroid nodule with benign cytopathology results.  Dr. Lucianne Muss  arranged for another clinic visit with [name] shortly after her  hospital stay.      Tonita Cong, M.D.  Electronically Signed     JSK/MEDQ  D:  09/26/2008  T:  09/26/2008  Job:  [id number]   cc:   Reather Littler, M.D.

## 2010-07-31 NOTE — Consult Note (Signed)
NAME:  Katelyn [name], Katelyn [name]               ACCOUNT NO.:  [id number]   MEDICAL RECORD NO.:  [id number]          PATIENT TYPE:  INP   LOCATION:  1343                         FACILITY:  [facility]   PHYSICIAN:  Charlaine Dalton. Sherene Sires, MD, FCCPDATE OF BIRTH:  05-08-20   DATE OF CONSULTATION:  09/26/2008  DATE OF DISCHARGE:                                 CONSULTATION   REASON FOR CONSULTATION:  Lung nodule.   HISTORY:  This is a delightful but extremely hard-of-hearing, 77-year-  old white female, who has never smoked and was having increasing dyspnea  and dry cough over the last several weeks with evidence of tracheal  compression by apparent substernal goiter by CT scan, which incidentally  revealed a 7 mm ground-glass nodule in the right lower lobe with  recommendations that a followup CT scan be considered at 6 to 12 months.   The patient is feeling better since admission and denies any history of  hemoptysis or pleuritic pain or significant chronic dyspnea or previous  chronic lung disease.  She does not have any baseline PFTs.   PAST MEDICAL HISTORY:  1. Hypertension not on ACE inhibitors.  2. Hypothyroidism.  3. Hyperlipidemia.  4. Acid reflux disease.  5. Status post hysterectomy.  6. Status post remote mastectomy.  7. Breast cancer, T1C-N0-MX with 96% estrogen receptor positive.   MEDICATIONS:  At the time of admission included meclizine, Zocor,  Wellbutrin, Nexium, Synthroid, calcium, Norvasc, and multivitamins.  Here in the hospital, she is on Lovenox, Multivites, Nolvadex, Norvasc,  Os-Cal, Protonix, Robitussin, Synthroid, Zocor, and Ventolin p.r.n.   SOCIAL HISTORY:  She has never smoked.  She denies any alcohol use.  She  still lives independently at home.   FAMILY HISTORY:  Negative for lung disease or atypia.   REVIEW OF SYSTEMS:  Taken in detail from the patient and her family as  well as from the HPI.  She has had no trouble with swallowing.  She has  had minimal hoarseness  with no chest pain of any kind.   PHYSICAL EXAMINATION:  GENERAL:  This is a frail, elderly, white female,  who cannot answer questions directly.  Typically, her family repeats the  questions and then she answers them, but it is very difficult to  communicate this way.  VITAL SIGNS:  She is afebrile and normal vital signs.  HEENT:  Unremarkable.  Pharynx clear.  NECK:  Supple without cervical adenopathy or tenderness.  Trachea is  midline without thyromegaly.  LUNG FIELDS:  Diminished breath sounds, but there is no stridor or  localized generalized wheezing.  HEART:  There is a regular rate and rhythm without murmur, gallop, or  rub present.  ABDOMEN:  Soft, benign, with no palpable organomegaly, masses, or  tenderness.  EXTREMITIES:  Warm without calf tenderness, cyanosis, clubbing, or  edema.   LABORATORY DATA:  Indicates an admission sodium of 126, a TSH of 10.53.   IMPRESSION:  1. I agree that most likely the reason she is having increasing      trouble with dyspnea and cough probably is related to tracheal  compression in the setting of a substernal goiter with inadequate      thyroid replacement.  She is a poor surgical candidate so before we      do anything else her TSH needs to be normalized and she will need      followup by Dr. Lucianne Muss to decide what is best.  A flow volume loop      would be very helpful here if the patient could truly cooperate in      full effort to see whether not this mass is flow-limiting, which is      not apparent by the exam today.  2. The tiny density seen in the right lower lobe by computerized      tomography (CT) scan only is of academic interest given the      patient's present general frailty.  Although I suppose it could      represent a very early malignancy or metastasis from breast cancer,      I do not believe it significantly affect with any clinical      significance her longterm prognosis and would recommend a simple      chest  x-ray in 1 year and that is what I recommended after a      discussion with pt and daughters of the extreme unlikelihood of a      clinically significant neoplastic process amenable to early      intervention in this setting.      Charlaine Dalton. Sherene Sires, MD, [facility]  Electronically Signed     MBW/MEDQ  D:  09/26/2008  T:  09/27/2008  Job:  [id number]   cc:   Antony Madura, M.D.  Fax: [phone]   Reather Littler, M.D.  Fax: [phone]

## 2010-08-03 NOTE — Procedures (Signed)
[facility]  Patient:    Katelyn [name], Katelyn [name] Visit Number: [id number] MRN: [id number]          Service Type: NES Location: NESC Attending Physician:  Lauree Chandler Dictated by:   Maretta Bees. Vonita Moss, M.D. Proc. Date: 03/25/01 Admit Date:  03/25/2001                             Procedure Report  PREOPERATIVE DIAGNOSIS:  Rule out interstitial cystitis.  POSTOPERATIVE DIAGNOSIS:  Rule out interstitial cystitis.  PROCEDURE:  Cystoscopy, hydraulic dilation of bladder, and cold cup bladder biopsy.  SURGEON:  Maretta Bees. Vonita Moss, M.D.  ANESTHESIA:  General.  INDICATIONS:  This 77 year old white female has had a 1-year history of pelvic and bladder pain of uncertain etiology.  She was worked up before by Hedwig Morton. Juanda Chance, M.D. [facility], from the GI point of view.  She has had previous normal abdominal and pelvic CT.  She has had a prior hysterectomy.  She has been cystoscoped in the office with no abnormalities noted.  She has been treated with multiple medications including Deuteroporphyrin, Pyridium, Elavil, and even went through a series of six weeks of intravesical therapy with DMSO, Solu-Cortef, and sodium bicarbonate, and she also took a pelvic pain and urgency symptoms test, and she scored 19, which gave her an 80% chance of having interstitial cystitis.  She still has persistent problems with Pelvicephalogram pain and is brought for anesthetic evaluation of the bladder.   PROCEDURE:  The patient was brought to the operating room and placed in the Lithotomy position.  The external genitalia were prepped and draped in the usual fashion.  She was cystoscoped, and the bladder had no stones, tumors, or inflammatory lesions.  She was filled but started leaking around the cystoscope at 450 cc and started straining under anesthesia, indicative of pain.  I put some urethral pressure and was able to fill her up a little bit more and then looked back in, and  she had increased erythema but just some rare, scattered petechiae and did not have Filaroides changes typical of IC. Cold cup bladder biopsies were taken on the bladder floor in two different sites and the biopsy sites fulgurated with the Bugbee electrode. She had good hemostasis and no blood loss.  The bladder was emptied and the scope removed, and the patient was sent to the recovery room in good condition, having tolerated the procedure satisfactorily. Dictated by:   Maretta Bees. Vonita Moss, M.D. Attending Physician:  Lauree Chandler DD:  03/25/01 TD:  03/25/01 Job: [id number]

## 2010-08-03 NOTE — Op Note (Signed)
NAME:  Katelyn [name], Katelyn [name] NO.:  [id number]   MEDICAL RECORD NO.:  [id number]          PATIENT TYPE:  OIB   LOCATION:  5707                         FACILITY:  [facility]   PHYSICIAN:  Gita Kudo, M.D. DATE OF BIRTH:  [birth year]   DATE OF PROCEDURE:  05/08/2005  DATE OF DISCHARGE:                                 OPERATIVE REPORT   OPERATIVE PROCEDURE:  Left partial mastectomy, left axillary sentinel node  biopsies.   SURGEON:  [place].   ANESTHESIA:  General.   PREOPERATIVE DIAGNOSIS:  Cancer left breast.   POSTOP DIAGNOSIS:  Cancer left breast with negative sentinel node.   CLINICAL SUMMARY:  77 year old female with abnormal mammogram, had needle  aspiration - core biopsy showing carcinoma. Comes in for treatment after  having had a negative MRI.   OPERATIVE FINDINGS:  I went widely around the breast tissue since it was not  able to be felt real well. I removed a significant amount of the upper outer  half of her left breast. I extended the incision over into the axilla and  using the methylene blue to follow as well as the NeoProbe, was able to  identify one hot node and removed that along with several others.   OPERATIVE PROCEDURE:  Under satisfactory general endotracheal anesthesia,  the patient was positioned, prepped and draped in standard fashion. A total  of 4 mL of methylene blue was injected intradermally below the areola. Then  she was draped and a curved incision made centered over 1 o'clock in the  left breast halfway from the nipple to the chest wall superiorly. Cautery  was used to develop flaps superiorly up to the pectoralis muscle, medially  to the area just medial to the midline, inferiorly to the level of the  nipple and laterally to the edge of the pectoralis. Then the breast tissue  was removed from the underlying pectoralis using cautery for both hemostasis  and dissection. The breast tissue was left attached to the axillary tail and  the  pectoralis muscles retracted and using the NeoProbe which identified a  hot node and looking for the blue dye which was not very good. We relied  more on the NeoProbe than the blue dye; the node was identified and removed  en bloc with a couple of other nodes.  The entire specimen was then marked  with suture and sent for pathology. The wound was lavaged with saline and  closed with 4-0 Vicryl suture. A JP drain let out an inferior stab wound and  coiled around the axilla over the chest wall. The skin edges stapled.  Sterile absorbent dressings were applied. The patient went to the recovery  room from the operative in good condition when the report came back from  pathology.           ______________________________  Gita Kudo, M.D.     MRL/MEDQ  D:  05/08/2005  T:  05/09/2005  Job:  [id number]

## 2010-08-06 ENCOUNTER — Ambulatory Visit: Payer: Self-pay | Admitting: Internal Medicine

## 2010-08-16 ENCOUNTER — Ambulatory Visit: Payer: 59 | Admitting: Internal Medicine

## 2010-08-21 ENCOUNTER — Ambulatory Visit (INDEPENDENT_AMBULATORY_CARE_PROVIDER_SITE_OTHER): Payer: MEDICARE | Admitting: Internal Medicine

## 2010-08-21 ENCOUNTER — Other Ambulatory Visit: Payer: MEDICARE

## 2010-08-21 ENCOUNTER — Encounter: Payer: Self-pay | Admitting: Internal Medicine

## 2010-08-21 VITALS — BP 132/62 | HR 64 | Temp 98.2°F | Ht 62.0 in | Wt 132.0 lb

## 2010-08-21 DIAGNOSIS — N390 Urinary tract infection, site not specified: Secondary | ICD-10-CM

## 2010-08-21 LAB — POCT URINALYSIS DIPSTICK
Bilirubin, UA: NEGATIVE
Glucose, UA: NEGATIVE
Ketones, UA: NEGATIVE
Nitrite, UA: POSITIVE

## 2010-08-21 MED ORDER — CIPROFLOXACIN HCL 250 MG PO TABS: 250.0000 mg | ORAL_TABLET | Freq: Two times a day (BID) | ORAL | Status: AC

## 2010-08-21 NOTE — Progress Notes (Signed)
Subjective:    Patient ID: Katelyn [name], female    DOB: 09-07-1918, 77 y.o.   MRN: [id number]  HPI  Here with dysuria Onset 4 days ago associated with acute increase in chronic suprapubic pain and burning No fever. No gross hematuria Hx same associated with UTI  Also reviewed chronic medical issues:  recurrent breast ca on R 04/2010 med change from tamoxifen to armidex by onc but not taking due to cost and upcoming carcinoid surg 09/03/10 No surg planned for breast ca due to other new dx SB cancer...   Probable carcinoid vs other SB cancer - dx 04/2010 with endo/capsule reports  - felt to be cause of abd/suprapubic pain - followup with GI/surg ongoing - surg resection planned in near future   HTN - reports compliance with ongoing medical treatment. denies adverse side effects related to current therapy. prev changed toprol xr to two times a day for cost concerns   hypothyroid - has followed with endo for same but would like to follow here - reports compliance with ongoing medical treatment but freq changes in medication dose or frequency. denies adverse side effects related to current therapy.   dyslipidemia - reports compliance with ongoing medical treatment and no changes in medication dose or frequency. denies adverse side effects related to current therapy.   Past Medical History  Diagnosis Date  . Hypertension   . Hypothyroidism   . Hx of colonic polyps     RLQ mensenteric mass-1.4cmx2.1cm CT 02/20/10 ? Carcinoid  . Hyperlipidemia   . Depression Aug 01, 1999    following death of spouse  . Breast cancer 1995    (R) lumpectomy, XT and tomifen- 08/01/2003 (L) partial mastectomy with tamoxifen; recurr R 04/2010  . GI cancer 04/2010  . Breast cancer 05/24/10    recurrence  . 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 Hiatal hernia   . Clostridium difficile colitis   . Anxiety   . Anemia   . Parkinson disease   . Hx of adenomatous colonic polyps 06/25/01  . GERD  (gastroesophageal reflux disease)     Review of Systems  Respiratory: Negative for cough.   Cardiovascular: Negative for chest pain.  Genitourinary: Positive for dysuria and frequency. Negative for flank pain, vaginal bleeding and difficulty urinating.       Objective:   Physical Exam BP 132/62  Pulse 64  Temp(Src) 98.2 F (36.8 C) (Oral)  Ht 5\' 2"  (1.575 m)  Wt 132 lb (59.875 kg)  BMI 24.14 kg/m2  SpO2 96% Physical Exam  Constitutional: She is oriented to person, place, and time. She appears well-developed and well-nourished. No distress. Dtr in law at side HENT: Very HOH!  Cardiovascular: Normal rate, regular rhythm and normal heart sounds.  No murmur heard. No BLE edema. Pulmonary/Chest: Effort normal and breath sounds normal. No respiratory distress. She has no wheezes.  Abdominal: Soft. Bowel sounds are normal. She exhibits no distension. There is mild suprapubic tenderness.  Psychiatric: She has a normal mood and affect. Her behavior is normal. Judgment and thought content normal.        Lab Results  Component Value Date   WBC 6.4 02/20/2010   HGB 12.2 06/01/2010   HCT 35.6 06/01/2010   PLT 146 06/01/2010   CHOL 155 06/06/2010   TRIG 114.0 06/06/2010   HDL 69.70 06/06/2010   ALT 13 06/01/2010   AST 18 06/01/2010   NA 135 06/01/2010   K 3.8 06/01/2010  CL 101 06/01/2010   CREATININE 1.09 06/01/2010   BUN 14 06/01/2010   CO2 23 06/01/2010   TSH 4.90 06/06/2010   HGBA1C 5.9 04/28/2009     Assessment & Plan:  UTI with +Udip - send for Ucx and start empiric Cipro bid x 7 d  Breast cancer, recurrence - prophylactic chemo on hold pending SB mass/cancer resection plans

## 2010-08-21 NOTE — Patient Instructions (Signed)
It was good to see you today. We have reviewed your prior records including labs and tests today Star Cipro antibiotics for bladder infection - Your prescription(s) have been submitted to your pharmacy. Please take as directed and contact our office if you believe you are having problem(s) with the medication(s). Will send urine for culture and call you in next 72h when results are back

## 2010-08-24 LAB — URINE CULTURE: Colony Count: >=100000

## 2010-08-27 ENCOUNTER — Other Ambulatory Visit: Payer: Self-pay | Admitting: General Surgery

## 2010-08-27 ENCOUNTER — Encounter (HOSPITAL_COMMUNITY): Payer: MEDICARE

## 2010-08-27 LAB — COMPREHENSIVE METABOLIC PANEL
ALT: 17 U/L (ref 0–35)
AST: 24 U/L (ref 0–37)
Albumin: 3.7 g/dL (ref 3.5–5.2)
Alkaline Phosphatase: 90 U/L (ref 39–117)
Chloride: 98 mEq/L (ref 96–112)
Potassium: 3.8 mEq/L (ref 3.5–5.1)
Total Bilirubin: 0.3 mg/dL (ref 0.3–1.2)

## 2010-08-27 LAB — URINALYSIS, ROUTINE W REFLEX MICROSCOPIC
Bilirubin Urine: NEGATIVE
Ketones, ur: NEGATIVE mg/dL
Nitrite: NEGATIVE
Specific Gravity, Urine: 1.019 (ref 1.005–1.030)
Urobilinogen, UA: 1 mg/dL (ref 0.0–1.0)

## 2010-08-27 LAB — CBC
HCT: 39 % (ref 36.0–46.0)
Hemoglobin: 12.6 g/dL (ref 12.0–15.0)
RBC: 4.2 MIL/uL (ref 3.87–5.11)

## 2010-08-27 LAB — DIFFERENTIAL
Lymphocytes Relative: 22 % (ref 12–46)
Monocytes Absolute: 1.3 10*3/uL — ABNORMAL HIGH (ref 0.1–1.0)
Monocytes Relative: 18 % — ABNORMAL HIGH (ref 3–12)
Neutro Abs: 4.6 10*3/uL (ref 1.7–7.7)

## 2010-08-27 LAB — SURGICAL PCR SCREEN
MRSA, PCR: NEGATIVE
Staphylococcus aureus: NEGATIVE

## 2010-09-03 ENCOUNTER — Inpatient Hospital Stay (HOSPITAL_COMMUNITY)
Admission: RE | Admit: 2010-09-03 | Discharge: 2010-09-11 | DRG: 330 | Disposition: A | Discharge status: Skilled Nursing Facility | Payer: MEDICARE | Source: Ambulatory Visit | Attending: General Surgery | Admitting: General Surgery

## 2010-09-03 ENCOUNTER — Other Ambulatory Visit (INDEPENDENT_AMBULATORY_CARE_PROVIDER_SITE_OTHER): Payer: Self-pay | Admitting: General Surgery

## 2010-09-03 ENCOUNTER — Inpatient Hospital Stay (HOSPITAL_COMMUNITY): Admission: RE | Admit: 2010-09-03 | Payer: Medicare Other | Source: Ambulatory Visit | Admitting: General Surgery

## 2010-09-03 DIAGNOSIS — E785 Hyperlipidemia, unspecified: Secondary | ICD-10-CM | POA: Diagnosis present

## 2010-09-03 DIAGNOSIS — I10 Essential (primary) hypertension: Secondary | ICD-10-CM | POA: Diagnosis present

## 2010-09-03 DIAGNOSIS — Z01812 Encounter for preprocedural laboratory examination: Secondary | ICD-10-CM

## 2010-09-03 DIAGNOSIS — E876 Hypokalemia: Secondary | ICD-10-CM | POA: Diagnosis not present

## 2010-09-03 DIAGNOSIS — C7A012 Malignant carcinoid tumor of the ileum: Principal | ICD-10-CM | POA: Diagnosis present

## 2010-09-03 DIAGNOSIS — K5669 Other intestinal obstruction: Secondary | ICD-10-CM | POA: Diagnosis present

## 2010-09-03 DIAGNOSIS — K560 Paralytic ileus: Secondary | ICD-10-CM | POA: Diagnosis not present

## 2010-09-03 DIAGNOSIS — I4891 Unspecified atrial fibrillation: Secondary | ICD-10-CM | POA: Diagnosis not present

## 2010-09-03 DIAGNOSIS — N390 Urinary tract infection, site not specified: Secondary | ICD-10-CM | POA: Diagnosis not present

## 2010-09-03 DIAGNOSIS — G20A1 Parkinson's disease without dyskinesia, without mention of fluctuations: Secondary | ICD-10-CM | POA: Diagnosis present

## 2010-09-03 DIAGNOSIS — E039 Hypothyroidism, unspecified: Secondary | ICD-10-CM | POA: Diagnosis present

## 2010-09-03 DIAGNOSIS — K660 Peritoneal adhesions (postprocedural) (postinfection): Secondary | ICD-10-CM | POA: Diagnosis present

## 2010-09-03 DIAGNOSIS — G20 Parkinson's disease: Secondary | ICD-10-CM | POA: Diagnosis present

## 2010-09-03 DIAGNOSIS — Z853 Personal history of malignant neoplasm of breast: Secondary | ICD-10-CM

## 2010-09-03 DIAGNOSIS — H9190 Unspecified hearing loss, unspecified ear: Secondary | ICD-10-CM | POA: Diagnosis present

## 2010-09-04 LAB — CBC
HCT: 32.3 % — ABNORMAL LOW (ref 36.0–46.0)
MCH: 30.3 pg (ref 26.0–34.0)
MCV: 91.5 fL (ref 78.0–100.0)
Platelets: 147 10*3/uL — ABNORMAL LOW (ref 150–400)
RBC: 3.53 MIL/uL — ABNORMAL LOW (ref 3.87–5.11)
WBC: 20.1 10*3/uL — ABNORMAL HIGH (ref 4.0–10.5)

## 2010-09-04 LAB — BASIC METABOLIC PANEL
CO2: 23 mEq/L (ref 19–32)
Chloride: 101 mEq/L (ref 96–112)
GFR calc non Af Amer: >60 mL/min (ref >60)
Glucose, Bld: 184 mg/dL — ABNORMAL HIGH (ref 70–99)
Potassium: 4.2 mEq/L (ref 3.5–5.1)
Sodium: 135 mEq/L (ref 135–145)

## 2010-09-05 ENCOUNTER — Ambulatory Visit: Payer: Medicare Other | Admitting: Internal Medicine

## 2010-09-05 LAB — BASIC METABOLIC PANEL
BUN: 8 mg/dL (ref 6–23)
Calcium: 9.1 mg/dL (ref 8.4–10.5)
Creatinine, Ser: 0.7 mg/dL (ref 0.50–1.10)
GFR calc Af Amer: >60 mL/min (ref >60)
GFR calc non Af Amer: >60 mL/min (ref >60)
Glucose, Bld: 148 mg/dL — ABNORMAL HIGH (ref 70–99)

## 2010-09-05 LAB — CBC
HCT: 32.8 % — ABNORMAL LOW (ref 36.0–46.0)
Hemoglobin: 10.8 g/dL — ABNORMAL LOW (ref 12.0–15.0)
MCH: 30.6 pg (ref 26.0–34.0)
MCHC: 32.9 g/dL (ref 30.0–36.0)
MCV: 92.9 fL (ref 78.0–100.0)
RDW: 13.5 % (ref 11.5–15.5)

## 2010-09-06 LAB — BASIC METABOLIC PANEL
Calcium: 8.8 mg/dL (ref 8.4–10.5)
GFR calc Af Amer: >60 mL/min (ref >60)
GFR calc non Af Amer: >60 mL/min (ref >60)
Glucose, Bld: 128 mg/dL — ABNORMAL HIGH (ref 70–99)
Potassium: 3.9 mEq/L (ref 3.5–5.1)
Sodium: 133 mEq/L — ABNORMAL LOW (ref 135–145)

## 2010-09-06 LAB — CBC
Hemoglobin: 11 g/dL — ABNORMAL LOW (ref 12.0–15.0)
MCH: 30.7 pg (ref 26.0–34.0)
MCHC: 33.3 g/dL (ref 30.0–36.0)
RDW: 13.5 % (ref 11.5–15.5)

## 2010-09-07 DIAGNOSIS — I369 Nonrheumatic tricuspid valve disorder, unspecified: Secondary | ICD-10-CM

## 2010-09-07 DIAGNOSIS — I4891 Unspecified atrial fibrillation: Secondary | ICD-10-CM

## 2010-09-07 LAB — URINALYSIS, ROUTINE W REFLEX MICROSCOPIC
Bilirubin Urine: NEGATIVE
Hgb urine dipstick: NEGATIVE
Nitrite: NEGATIVE
Protein, ur: NEGATIVE mg/dL
Urobilinogen, UA: 1 mg/dL (ref 0.0–1.0)

## 2010-09-07 LAB — URINE MICROSCOPIC-ADD ON

## 2010-09-07 NOTE — Consult Note (Signed)
NAMEHAILLE, PARDI NO.:  [id number]  MEDICAL RECORD NO.:  [id number]  LOCATION:  1522                         FACILITY:  [facility]  PHYSICIAN:  Jesse Sans. Aldridge Krzyzanowski, MD, FACCDATE OF BIRTH:  08/20/1918  DATE OF CONSULTATION: DATE OF DISCHARGE:                                CONSULTATION   CHIEF COMPLAINT:  None.  I was asked by Dr. Johna Sheriff to evaluate postoperative atrial fibrillation.  HISTORY OF PRESENT ILLNESS:  Ms. Patman is an 77 year old previously active white female who has no cardiac history.  She underwent diagnostic laparoscopic surgery with lysis of adhesions and open resection of terminal ileum and right colon with primary anastomosis by Dr. Johna Sheriff on September 03, 2010.  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  She was noted to have a rapid heart beat by nursing.  EKG confirms atrial fibrillation with a ventricular rate of 118 beats per minute. There are no ST segment changes.  Previous EKG November 19, 2009 showed normal sinus rhythm and no ST segment changes.  She is totally asymptomatic.  There is no previous cardiac history.  She does have predisposing factors for atrial fib, postop stress, hypertension and age.  [place] score is 2.  PAST MEDICAL HISTORY:  Significant for decreased hearing, hypertension, hyperlipidemia, history of breast cancer, on chronic tamoxifen, and diagnosis of Parkinson's.  PAST SURGICAL HISTORY:  Lumpectomy on the left breast, hysterectomy, back surgery, tumor removed from her stomach which was benign.  SOCIAL HISTORY:  She does not smoke or drink.  Her son is a Education officer, environmental.  She has a very supportive family.  I talked to her daughter who was here today.  FAMILY HISTORY:  Mom died at 68 of natural causes.  Father died at 33 with colon cancer.  ALLERGIES:  None.  REVIEW OF SYSTEMS:  She has had no chest pain, orthopnea, PND or edema. Other review of systems are negative.  CURRENT  MEDICATIONS: 1. Amlodipine 5 mg p.o. daily which she is not getting because she is     not taking p.o.'s at present. 2. Heparin 5000 units q.8h. subcu. 3. Levothyroxine 25 mcg IV daily. 4. Metoprolol 10 mg IV q.6h. 5. Pantoprazole 40 mg IV q.h.s. 6. Morphine p.r.n. 7. Zofran p.r.n.  PHYSICAL EXAMINATION:  GENERAL:  She is lying in bed, very comfortable. She is slightly confused but could read by jacket label and recalled her husband having a Risk manager.  She is in no acute distress. Daughter at the bedside. VITAL SIGNS:  Blood pressure is 144/88, pulse 100 and irregular.  Her respirations are 18 and unlabored.  She is afebrile, 97 degrees.  O2 sats 95% on 2 liters nasal cannula. HEENT:  Dentition fair.  Symmetrical face.  PERRLA. NECK:  Supple.  Carotid upstrokes equal bilaterally without bruits.  No thyromegaly and no tracheal deviation. LUNGS:  Reduced breath sounds in both bases but no rales or rhonchi or wheezes. HEART:  Nondisplaced PMI.  Regular rate and rhythm.  Soft systolic murmur along left sternal border.  S2 splits. ABDOMEN:  Soft with minimal if any bowel sounds. EXTREMITIES:  No edema.  Pneumatic cuffs in place.  Pulses were brisk bilaterally. NEUROLOGIC:  She is oriented to her self and her daughter.  LABORATORY DATA:  Potassium 3.9.  TSH was normal.  CBC, hemoglobin is 11.  White count 9.6.  EKG as mentioned above.  ASSESSMENT:  Postoperative paroxysmal atrial fibrillation.  Other risk factors for atrial fibrillation include age and hypertension.  She is totally asymptomatic.  Rate control at this point with conservative management is indicated.  RECOMMENDATIONS: 1. Increase Lopressor to 10 mg q.4h.  Goal heart rate less than or     equal to 80 beats per minute. 2. No need to transfer to telemetry.  She needs services here in the     postoperative unit and she is asymptomatic and hemodynamically     stable. 3. Obtain 2-D echocardiogram. 4. She  most likely is a Coumadin candidate once she is cleared by     surgery to have long-term anticoagulation.  She is currently on     heparin 5000 units q.8h., which should be providing fairly good     systemic coverage at this point.  Thanks for the consultation.  Will follow with you.     Isma Tietje C. Daleen Squibb, MD, [facility]     TCW/MEDQ  D:  09/07/2010  T:  09/07/2010  Job:  [id number]  Electronically Signed by Valera Castle MD [facility] on 09/07/2010 10:58:56 AM

## 2010-09-08 LAB — BASIC METABOLIC PANEL
BUN: 7 mg/dL (ref 6–23)
CO2: 30 mEq/L (ref 19–32)
Chloride: 98 mEq/L (ref 96–112)
Creatinine, Ser: 0.75 mg/dL (ref 0.50–1.10)

## 2010-09-08 LAB — CBC
HCT: 30.4 % — ABNORMAL LOW (ref 36.0–46.0)
MCH: 30.1 pg (ref 26.0–34.0)
MCV: 92.4 fL (ref 78.0–100.0)
RBC: 3.29 MIL/uL — ABNORMAL LOW (ref 3.87–5.11)
WBC: 5.9 10*3/uL (ref 4.0–10.5)

## 2010-09-09 LAB — URINE CULTURE: Colony Count: >=100000

## 2010-09-17 ENCOUNTER — Ambulatory Visit (INDEPENDENT_AMBULATORY_CARE_PROVIDER_SITE_OTHER): Payer: MEDICARE | Admitting: Surgery

## 2010-09-17 DIAGNOSIS — C7A019 Malignant carcinoid tumor of the small intestine, unspecified portion: Secondary | ICD-10-CM

## 2010-09-17 NOTE — Progress Notes (Signed)
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 She is doing well. 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 I removed her staples and applied Steri-Strips. She'll see Dr. Johna Sheriff for her back in the office in about 6-8 weeks.

## 2010-09-17 NOTE — Op Note (Signed)
NAMEGABRIAL, POPPELL NO.:  [id number]  MEDICAL RECORD NO.:  [id number]  LOCATION:  1522                         FACILITY:  [facility]  PHYSICIAN:  Sharlet Salina T. Tiani Stanbery, M.D.DATE OF BIRTH:  [birth year]  DATE OF PROCEDURE: DATE OF DISCHARGE:                              OPERATIVE REPORT   PREOPERATIVE DIAGNOSIS:  Small bowel and mesenteric mass at terminal ileum with partial obstruction.  POSTOPERATIVE DIAGNOSIS:  Small bowel and mesenteric mass at terminal ileum with partial obstruction.  SURGICAL PROCEDURE:  Diagnostic laparoscopy with lysis of adhesions and open resection of terminal ileum and right colon with primary anastomosis.  SURGEON:  Lorne Skeens. Tiffony Kite, MD  ASSISTANT:  Abigail Miyamoto, MD  HISTORY:  Ms. Char is a pleasant 77 year old female who has at least a 72-month history of ongoing abdominal pain.  She has had vague lower abdominal pain, awakening her from sleep at night.  She has had a CT scan showing a mass in the mesentery of the terminal ileum.  She had a small mass noted here in 2006, but it had increased from about 1 cm to 1.6 cm on recent CT.  There was no evidence of bowel obstruction. However, capsule endoscopy by Dr. Juanda Chance showed hang up of the capsule temporarily at the terminal ileum and a mass in the terminal ileum obstructing about a thorough lumen.  She is felt to likely have progressive slow-growing carcinoid tumor or possibly lymphoma with gradually worsening symptoms of intermittent obstruction.  Again, we have been following her for about 6 months, but due to progressive worsening of her symptoms not felt to be tolerable, we have elected to proceed with exploration and probable resection in this area.  The nature of the procedure, its indications, risks of bleeding, infection, anastomotic leak, anesthetic complications, possible diagnoses were discussed with the patient and her family.  She now brought to the operating  room for this procedure.  DESCRIPTION OF OPERATION:  The patient brought to the operating room, placed in supine position on the operating table, and general endotracheal anesthesia was induced.  She had received broad-spectrum preoperative IV antibiotics.  PAS were in place.  The abdomen was widely and sterilely prepped and draped.  Orogastric tube was inserted. Correct patient and procedure were verified.  Initially access was obtained in the left upper quadrant with a 5 mm Optiview trocar without difficulty and pneumoperitoneum established.  There was no evidence of any trocar injury.  A second 5 mm trocar was placed laterally in the left midabdomen.  Midline adhesions of the omentum were then taken down with scissor cautery from the patient's previous low midline hysterectomy incision.  We were then able to carefully examine the right lower quadrant.  There was thickening and fixation of the terminal ileum.  There was a mass that was discernible posterior to this.  There was generally quite a bit of induration and immobility of the bowel and mesentery in this area.  We did not feel that this could be safely immobilized and resected laparoscopically.  We were able, however, to localize the exact site for incision overlying this area.  Following this, the trocars were removed and a transverse incision was  made in the right lower to mid abdomen.  Dissection was carried down through subcutaneous tissue, fascial muscle layers with cautery, and the peritoneum entered under direct vision.  There was again palpable mass in the mesentery with surrounding induration and there was thickening and some dilatation of the terminal ileum right at the cecum.  It was also adherent fairly densely the lateral abdominal wall.  The lateral adhesions were divided with cautery.  The more proximal small bowel was examined and it was normal.  She came down to the terminal ileum about 10-12 cm from the  ileocecal valve.  The bowel was somewhat fixed down to a mesenteric mass just beneath the terminal ileum.  There was thickening of the bowel down to the ileocecal valve with fair amount of thickening of the cecum and terminal ileum.  The colon was somewhat thickened and tethered down to the mesenteric mass proximally and then the hepatic flexure and transverse colon appeared  more normal.  We elected to go ahead with resection of the terminal ileum and right colon.  The omentum was dissected up off the distal transverse colon along the avascular area and divided with the LigaSure.  Points of proximal distal resection were chosen and normal small bowel in distal right colon.  These areas were cleared of mesentery and divided with the GIA stapler.  The mesentery of the involved segment was then sequentially taken with the LigaSure.  As we got to the more thickened areas of mesentery and the mass, in the mesentery we came just posterior to this, excising all the abnormal tissue.  This was down toward the duodenum, but the duodenum was identified carefully and protected.  We came down along the mesentery of the terminal ileum and right colon, taking the ileocolic and right colic vessels with clamps and suture ligating with 2-0 silk and the specimen was removed.  Again, there was no residual gross disease left behind.  Hemostasis was assured.  Following this, a functional end-to-end anastomosis was created with a single firing of the GIA 75 mm stapler between the terminal ileum and proximal transverse colon.  Staple line was intact without bleeding.  The common enterotomy was closed in 2 layers with full-thickness running 2-0 chromic and outer layer of seromuscular 2-0 silks.  The mesentery was closed with interrupted silks.  The abdomen was then thoroughly irrigated.  A wound protector had been placed and this was removed and all gloves and instruments changed.  Exparel local anesthesia was  injected into the abdominal wall and subcutaneous tissues.  The wound was then closed in 2 layers with running 0 PDS.  The subcutaneous tissue was irrigated and skin closed with staples.  Sponge and needle counts were correct. Dressings were applied.  The patient taken to Recovery in good condition.     Lorne Skeens. Ellyssa Zagal, M.D.     Tory Emerald  D:  09/03/2010  T:  09/04/2010  Job:  [id number]  cc:   Hedwig Morton. Juanda Chance, MD [address] [place] [address]  Electronically Signed by Glenna Fellows M.D. on 09/17/2010 09:46:44 AM

## 2010-09-24 NOTE — Discharge Summary (Signed)
[name], [name] NO.:  [id number]  MEDICAL RECORD NO.:  [id number]  LOCATION:  1522                         FACILITY:  [facility]  PHYSICIAN:  Sandria Bales. Ezzard Standing, M.D.  DATE OF BIRTH:  1918/12/15  DATE OF ADMISSION:  09/03/2010 DATE OF DISCHARGE:  09/12/2010                              DISCHARGE SUMMARY  DISCHARGE DIAGNOSES: 1. 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 2. Atrial fibrillation which has resolved. 3. Urinary tract infection with enterococcus species resistant to lot     of antibiotics. 4. Decreased hearing. 5. Hypertension. 6. Hyperlipidemia. 7. History of breast cancer, on Arimidex 8. Hypothyroid, on thyroid replacement. 9. Diagnosis of Parkinson disease.  OPERATIONS PERFORMED:  The patient underwent a diagnostic laparoscopy, lysis of adhesions and resection of terminal ileum of right colon with primary anastomosis by Dr. Glenna Fellows on September 03, 2010.  HISTORY OF ILLNESS:  Katelyn [name] is an 77 year old white female who apparently was referred to Dr. Johna Sheriff through the emergency room for some abdominal complaints.  It looks like at one time she had a diagnosis of C diff colitis for which she is on Flagyl but was in the office a couple of weeks when she got sick.  She has felt a pressure in her lower abdomen.  A CT scan obtained showed a spiculated mass in the mesentery of her right lower quadrant.  This mass was apparently noted in 2006, it was 1.3 cm and now is 2.1 cm.  A full discussion was carried out with the patient.  PAST MEDICAL HISTORY:  Her past medical history is: 1. She had history of cardiac arrhythmias. 2. She has had progressive weakness with some trouble with her gait     over last couple of years. 3. She has had hypothyroid for which she is on replacement. 4. She has elevated cholesterol. 5. She had a partial mastectomy, now on Arimidex 6. She has significant decreased  hearing.  She sees Dr. Rene Paci, I think, he is primary medical doctor; Dr. [name] seen her before from a GI standpoint.  She is seen in the hospital by Dr. Excell Seltzer, I think, for [facility] because of atrial fibrillation.  On the day of admission, she was taken to the operating room by Dr. Johna Sheriff and underwent diagnostic laparoscopy with lysis of adhesions, open resection of terminal ileum and right colon with primary anastomosis.  Postoperatively, she did well.  On the third postoperative day, her hemoglobin was 11, white blood count was 9100, potassium 3.9, creatinine was 0.66.  She has some hypertension controlled with Lopressor.  Because of some postop atrial fibrillation, she was seen initially by Dr. Valera Castle from [facility].  This was on September 07, 2010. She also had a urine culture which came back with enterococcus species greater than 100,000; was resistant to a lot of antibiotics; so she was placed on vancomycin and she was given subcu heparin for VTE prophylaxis postop.  She is now 8 days postop.  She is in a sinus rhythm.  She is afebrile. She is eating food, having bowel movements, and is doing well  and ready for discharge.  Because of her age, her weakness and her instability, it is felt that she will be best served by going to a supervised care facility such as either a rehab or skilled nursing.  Her final pathology showed a 1.7-cm invasive carcinoid tumor with 2/9 nodes positive.  I have spoken to her daughter a fair amount about her discharge plan. She will resume her home medications when she goes including alprazolam 0.25 mg p.r.n. at bedtime. She is on some multivitamins and calcium.   She takes omeprazole  40 mg. Arimidex 1 mg daily.   Amlodipine 5 mg daily.   Synthroid 50 mcg daily. Simvastatin 40 mg daily.  She is on metoprolol 50 mg daily.  I will give her a prescription for nitrofurantoin 50 mg 4 times a day for 7  days to complete treatment of her urinary tract infection.  She should have no restrictions for her diet.  She can shower.  She is to see Dr. Johna Sheriff back in 1 week for staple removal.  Unless she is here for several more days, we will get her staples out before leaving. She has no other special instructions.  I think she knows to follow up with Dr. Felicity Coyer within a month to make sure there are no other medical issues that need to be addressed.  Her discharge condition is good.   I think the other thing was she needs to see is a medical oncologist and we will leave this to Dr. Johna Sheriff.    Sandria Bales. Ezzard Standing, M.D., FACS   DHN/MEDQ  D:  09/11/2010  T:  09/11/2010  Job:  [id number]  cc:   Vikki Ports A. Felicity Coyer, MD [address] [place], [place] [address]  Hedwig Morton. Juanda Chance, MD [address] [place] [address]  Veverly Fells. Excell Seltzer, MD [address] [place], [place] [address]  Electronically Signed by Ovidio Kin M.D. on 09/24/2010 02:16:25 PM

## 2010-10-01 ENCOUNTER — Ambulatory Visit: Payer: Medicare Other | Admitting: Internal Medicine

## 2010-10-15 ENCOUNTER — Ambulatory Visit (INDEPENDENT_AMBULATORY_CARE_PROVIDER_SITE_OTHER): Payer: MEDICARE | Admitting: Internal Medicine

## 2010-10-15 ENCOUNTER — Other Ambulatory Visit (INDEPENDENT_AMBULATORY_CARE_PROVIDER_SITE_OTHER): Payer: MEDICARE

## 2010-10-15 ENCOUNTER — Other Ambulatory Visit (INDEPENDENT_AMBULATORY_CARE_PROVIDER_SITE_OTHER): Payer: MEDICARE | Admitting: Internal Medicine

## 2010-10-15 ENCOUNTER — Other Ambulatory Visit: Payer: Self-pay | Admitting: Internal Medicine

## 2010-10-15 ENCOUNTER — Encounter: Payer: Self-pay | Admitting: Internal Medicine

## 2010-10-15 DIAGNOSIS — Z0000 Encounter for general adult medical examination without abnormal findings: Secondary | ICD-10-CM

## 2010-10-15 DIAGNOSIS — R300 Dysuria: Secondary | ICD-10-CM

## 2010-10-15 DIAGNOSIS — Z853 Personal history of malignant neoplasm of breast: Secondary | ICD-10-CM

## 2010-10-15 DIAGNOSIS — D3A00 Benign carcinoid tumor of unspecified site: Secondary | ICD-10-CM

## 2010-10-15 DIAGNOSIS — E039 Hypothyroidism, unspecified: Secondary | ICD-10-CM

## 2010-10-15 DIAGNOSIS — D490 Neoplasm of unspecified behavior of digestive system: Secondary | ICD-10-CM

## 2010-10-15 LAB — URINALYSIS, ROUTINE W REFLEX MICROSCOPIC
Nitrite: NEGATIVE
Total Protein, Urine: NEGATIVE
Urine Glucose: NEGATIVE

## 2010-10-15 LAB — TSH: TSH: 3.31 u[IU]/mL (ref 0.35–5.50)

## 2010-10-15 MED ORDER — CIPROFLOXACIN HCL 250 MG PO TABS: 250.0000 mg | ORAL_TABLET | Freq: Two times a day (BID) | ORAL | Status: AC

## 2010-10-15 NOTE — Assessment & Plan Note (Signed)
Previously folllowed with endo (kumar) but now follows here Check TSH now  Lab Results  Component Value Date   TSH 4.90 06/06/2010

## 2010-10-15 NOTE — Patient Instructions (Signed)
It was good to see you today. We have reviewed your hospital records including labs and tests today - you are doing great! Medications reviewed, no changes at this time. Test(s) ordered today. Your results will be called to you after review (48-72hours after test completion). If any changes need to be made, you will be notified at that time. Keep follow up with your other doctors as discussed -  Will check into cost of shingles shot as requested and let your know about this Please schedule followup in 4 months for thyroid and blood pressure check, call sooner if problems.

## 2010-10-15 NOTE — Assessment & Plan Note (Signed)
DC summary 08/2010 reviewed - recovering well post op follow up gen surg and med onc as planned

## 2010-10-15 NOTE — Progress Notes (Signed)
Subjective:    Patient ID: Katelyn [name], female    DOB: [birth year], 77 y.o.   MRN: [id number]  HPI   Here for follow up - s/p carcinoid resection 08/2010 and [facility] rehab  DISCHARGE DIAGNOSES:  1. 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  2. Atrial fibrillation which has resolved.  3. Urinary tract infection with enterococcus species resistant to lot of antibiotics.  4. Decreased hearing.  5. Hypertension.  6. Hyperlipidemia.  7. History of breast cancer, on Arimidex  8. Hypothyroid, on thyroid replacement.  9. Diagnosis of Parkinson disease.  Also reviewed chronic medical issues:  recurrent breast ca on R 04/2010 med change from tamoxifen to armidex by onc  No surg planned for breast ca due to carcinoid cancer  Carcinoid SB cancer - dx 04/2010 with endo/capsule reports  - felt to be cause of abd/suprapubic pain - resection 09/03/2010 - all pain resolved  HTN - reports compliance with ongoing medical treatment. denies adverse side effects related to current therapy. prev changed toprol xr to BID for cost concerns   hypothyroid - has followed with endo for same but would like to follow here - reports compliance with ongoing medical treatment but freq changes in medication dose or frequency. denies adverse side effects related to current therapy.   dyslipidemia - on statin - reports compliance with ongoing medical treatment and no changes in medication dose or frequency. denies adverse side effects related to current therapy.   Past Medical History  Diagnosis Date  . Hypertension   . Hypothyroidism   . Hx of colonic polyps     RLQ mensenteric mass-1.4cmx2.1cm CT 02/20/10 ? Carcinoid  . Hyperlipidemia   . Depression 08/16/1999    following death of spouse  . Hiatal hernia   . Clostridium difficile colitis   . Anxiety   . Anemia   . Parkinson disease   . Hx of adenomatous colonic polyps 06/25/01  . GERD (gastroesophageal reflux disease)     . Breast cancer 1995    (R) lumpectomy, XT and tomifen- Aug 16, 2003 (L) partial mastectomy with tamoxifen; recurr R 04/2010  . Carcinoid tumor, malignant 04/2010 SB capsule dx    09/03/10 resection; 2/9 positive LN  . Breast cancer 05/24/10    recurrence  . Atrial fibrillation     peri/post op 08/2010 - resumed NSR    Review of Systems  Respiratory: Negative for cough.   Cardiovascular: Negative for chest pain.  Genitourinary: Positive for dysuria and frequency. Negative for flank pain, vaginal bleeding and difficulty urinating.       Objective:   Physical Exam  BP 132/62  Pulse 74  Temp(Src) 97.9 F (36.6 C) (Oral)  Ht 5\' 2"  (1.575 m)  Wt 131 lb 12.8 oz (59.784 kg)  BMI 24.11 kg/m2  SpO2 97%  Constitutional: She is oriented to person, place, and time. She appears well-developed and well-nourished. No distress. Dtr in law at side HENT: Very HOH!  Cardiovascular: Normal rate, regular rhythm and normal heart sounds.  No murmur heard. No BLE edema. Pulmonary/Chest: Effort normal and breath sounds normal. No respiratory distress. She has no wheezes.  Abdominal: Soft. Bowel sounds are normal. She exhibits no distension. There is mild suprapubic tenderness. RLQ scar well healed  Psychiatric: She has a normal mood and affect. Her behavior is normal. Judgment and thought content normal.      Lab Results  Component Value Date   WBC 5.9 09/08/2010  HGB 9.9* 09/08/2010   HCT 30.4* 09/08/2010   PLT 189 09/08/2010   CHOL 155 06/06/2010   TRIG 114.0 06/06/2010   HDL 69.70 06/06/2010   ALT 17 08/27/2010   AST 24 08/27/2010   NA 134* 09/08/2010   K 3.3* 09/08/2010   CL 98 09/08/2010   CREATININE 0.75 09/08/2010   BUN 7 09/08/2010   CO2 30 09/08/2010   TSH 4.90 06/06/2010   HGBA1C 5.9 04/28/2009     Assessment & Plan:  Dysuria, hx recurrent UTI- check UA and treat if positive Udip  Breast cancer, recurrence - prophylactic chemo ongoing - follow up onc as planned  Also see problem list.  Medications and labs reviewed today.

## 2010-10-16 ENCOUNTER — Telehealth: Payer: Self-pay | Admitting: *Deleted

## 2010-10-16 NOTE — Telephone Encounter (Signed)
Pt left note about zostavax coverage while waiting on OV Monday. I checked [facility], It was rejected - they can pay full price or call plan and/or supplement if they have one to get further info regarding coverage. Please inform patient, thanks

## 2010-10-17 NOTE — Telephone Encounter (Signed)
Notified pt daughter (patricia) gave her info. She states will contact her secondary insurance to see if they would cover. Will call back to schedule once she get more coverage info...10/17/10@11 :20am/LMB

## 2010-10-31 ENCOUNTER — Other Ambulatory Visit: Payer: Self-pay

## 2010-10-31 ENCOUNTER — Encounter (INDEPENDENT_AMBULATORY_CARE_PROVIDER_SITE_OTHER): Payer: MEDICARE | Admitting: General Surgery

## 2010-10-31 MED ORDER — AMLODIPINE BESYLATE 5 MG PO TABS: 5.0000 mg | ORAL_TABLET | Freq: Every day | ORAL | Status: DC

## 2010-10-31 MED ORDER — METOPROLOL TARTRATE 25 MG PO TABS: 25.0000 mg | ORAL_TABLET | Freq: Two times a day (BID) | ORAL | Status: DC

## 2010-10-31 MED ORDER — SIMVASTATIN 40 MG PO TABS: 40.0000 mg | ORAL_TABLET | Freq: Every day | ORAL | Status: DC

## 2010-11-07 ENCOUNTER — Telehealth: Payer: Self-pay | Admitting: Internal Medicine

## 2010-11-07 ENCOUNTER — Other Ambulatory Visit: Payer: Self-pay | Admitting: Internal Medicine

## 2010-11-07 MED ORDER — ATORVASTATIN CALCIUM 20 MG PO TABS: 20.0000 mg | ORAL_TABLET | Freq: Every day | ORAL | Status: DC

## 2010-11-07 NOTE — Telephone Encounter (Signed)
Please notify pt/dtr: her pharmacy ([facility]) has requested change in cholesterol medication due to coadministration with amlodipine (her BP med) - therefore i have stopped simva and rx'd atorva for cholesterol tx - no other med changes - thanks

## 2010-11-07 NOTE — Telephone Encounter (Signed)
Notified pt daughter (patricia) with change of cholestrol med. Fax back form to [facility], also daughter is requesting some samples of lipitor until mailorder comes in. Will leave some up front for pick-up...11/07/10@2 :15pm/LMB

## 2010-11-07 NOTE — Telephone Encounter (Signed)
Call daughter back didn't have samples of Lipitor, ask md is it ok fot pt to take 1/2 of simvastatin until she received new rx for lipitor. MD states its ok, called daughter to let her know had to leave vm with info...11/07/10@2 :26pm/LMB

## 2010-11-20 ENCOUNTER — Other Ambulatory Visit: Payer: Self-pay | Admitting: Oncology

## 2010-11-20 ENCOUNTER — Encounter (HOSPITAL_BASED_OUTPATIENT_CLINIC_OR_DEPARTMENT_OTHER): Payer: MEDICARE | Admitting: Oncology

## 2010-11-20 DIAGNOSIS — C7A019 Malignant carcinoid tumor of the small intestine, unspecified portion: Secondary | ICD-10-CM

## 2010-11-20 DIAGNOSIS — Z170 Estrogen receptor positive status [ER+]: Secondary | ICD-10-CM

## 2010-11-20 DIAGNOSIS — Z853 Personal history of malignant neoplasm of breast: Secondary | ICD-10-CM

## 2010-11-20 DIAGNOSIS — C50919 Malignant neoplasm of unspecified site of unspecified female breast: Secondary | ICD-10-CM

## 2010-11-20 LAB — COMPREHENSIVE METABOLIC PANEL
ALT: 10 U/L (ref 0–35)
AST: 14 U/L (ref 0–37)
Albumin: 3.9 g/dL (ref 3.5–5.2)
Alkaline Phosphatase: 82 U/L (ref 39–117)
BUN: 12 mg/dL (ref 6–23)
Chloride: 100 mEq/L (ref 96–112)
Potassium: 3.7 mEq/L (ref 3.5–5.3)
Sodium: 137 mEq/L (ref 135–145)

## 2010-11-20 LAB — CBC WITH DIFFERENTIAL/PLATELET
Basophils Absolute: 0 10*3/uL (ref 0.0–0.1)
Eosinophils Absolute: 0 10*3/uL (ref 0.0–0.5)
HGB: 10.4 g/dL — ABNORMAL LOW (ref 11.6–15.9)
LYMPH%: 18.2 % (ref 14.0–49.7)
MCV: 83.7 fL (ref 79.5–101.0)
MONO#: 0.7 10*3/uL (ref 0.1–0.9)
MONO%: 11.6 % (ref 0.0–14.0)
NEUT#: 4 10*3/uL (ref 1.5–6.5)
Platelets: 161 10*3/uL (ref 145–400)
WBC: 5.7 10*3/uL (ref 3.9–10.3)

## 2010-12-06 ENCOUNTER — Ambulatory Visit (INDEPENDENT_AMBULATORY_CARE_PROVIDER_SITE_OTHER): Payer: MEDICARE | Admitting: Endocrinology

## 2010-12-06 ENCOUNTER — Encounter: Payer: Self-pay | Admitting: Endocrinology

## 2010-12-06 ENCOUNTER — Other Ambulatory Visit: Payer: MEDICARE

## 2010-12-06 VITALS — BP 122/56 | HR 82 | Temp 98.7°F | Ht 62.0 in | Wt 130.6 lb

## 2010-12-06 DIAGNOSIS — N390 Urinary tract infection, site not specified: Secondary | ICD-10-CM

## 2010-12-06 LAB — POCT URINALYSIS DIPSTICK
Bilirubin, UA: NEGATIVE
Glucose, UA: NEGATIVE
Spec Grav, UA: 1.02
pH, UA: 6

## 2010-12-06 MED ORDER — CEFUROXIME AXETIL 500 MG PO TABS: 500.0000 mg | ORAL_TABLET | Freq: Two times a day (BID) | ORAL | Status: AC

## 2010-12-06 NOTE — Patient Instructions (Addendum)
i have sent a prescription to your pharmacy, for an antibiotic.  Please finish the entire 10 days.   You should keep decaffeinated beverages on hand. I hope you feel better soon.  If you don't feel better by next week, please call doctor leschber.

## 2010-12-06 NOTE — Progress Notes (Signed)
Subjective:    Patient ID: Katelyn [name], female    DOB: 05-03-1918, 77 y.o.   MRN: [id number]  HPI Pt state a few days of dysuria and urgency.  Pt is here with dtr-in-law (selena Kyllo) who says pt gets frequent uti's.   Past Medical History  Diagnosis Date  . Hypertension   . Hypothyroidism   . Hx of colonic polyps     RLQ mensenteric mass-1.4cmx2.1cm CT 02/20/10 ? Carcinoid  . Hyperlipidemia   . Depression Aug 09, 1999    following death of spouse  . Hiatal hernia   . Clostridium difficile colitis   . Anxiety   . Anemia   . Parkinson disease   . Hx of adenomatous colonic polyps 06/25/01  . GERD (gastroesophageal reflux disease)   . Breast cancer 1995    (R) lumpectomy, XT and tomifen- 2003-08-09 (L) partial mastectomy with tamoxifen; recurr R 04/2010  . Carcinoid tumor, malignant 04/2010 SB capsule dx    09/03/10 resection; 2/9 positive LN  . Breast cancer 05/24/10    recurrence  . Atrial fibrillation     peri/post op 08/2010 - resumed NSR    Past Surgical History  Procedure Date  . Abdominal hysterectomy 1969  . Back surgery 1959  . Breast surgery 1995    (R) Lumpectomy  . Mastectomy, partial Aug 09, 2003    left    History   Social History  . Marital Status: Widowed    Spouse Name: N/A    Number of Children: N/A  . Years of Education: N/A   Occupational History  . Not on file.   Social History Main Topics  . Smoking status: Never Smoker   . Smokeless tobacco: Never Used  . Alcohol Use: No  . Drug Use: No  . Sexually Active:    Other Topics Concern  . Not on file   Social History Narrative   Widowed, lives alone but supportive children (4 in town). Has sitter/aide M-F to assist with chores, shopping/transport, comp    Current Outpatient Prescriptions on File Prior to Visit  Medication Sig Dispense Refill  . ALPRAZolam (XANAX) 0.25 MG tablet Take 1 tablet (0.25 mg total) by mouth at bedtime as needed and may repeat dose one time if needed for sleep or anxiety.  30 tablet   0  . amLODipine (NORVASC) 5 MG tablet Take 1 tablet (5 mg total) by mouth daily.  90 tablet  1  . anastrozole (ARIMIDEX) 1 MG tablet Take 1 by mouth daily      . atorvastatin (LIPITOR) 20 MG tablet Take 1 tablet (20 mg total) by mouth daily.  30 tablet  11  . Calcium-Vitamin D-Iron (CALCIUM 600 IRON/D) 409-811-91 MG-UNIT-MG TABS Take by mouth daily.        . metoprolol tartrate (LOPRESSOR) 25 MG tablet Take 1 tablet (25 mg total) by mouth 2 (two) times daily.  180 tablet  1  . Multiple Vitamins-Minerals (CENTRUM SILVER) tablet Take 1 tablet by mouth daily.        Marland Kitchen omeprazole (PRILOSEC) 40 MG capsule Take 1 capsule (40 mg total) by mouth daily.  90 capsule  1  . SYNTHROID 50 MCG tablet TAKE ONE TABLET BY MOUTH DAILY  30 tablet  4   No Known Allergies  Family History  Problem Relation Age of Onset  . Breast cancer Mother   . Hyperlipidemia Mother   . Hypertension Mother   . Colon cancer Mother     ?  . Prostate cancer  Father   . Breast cancer Sister     x'2  . Breast cancer Daughter   . Colon polyps Daughter   . Colon polyps Son   . Colon cancer Father    BP 122/56  Pulse 82  Temp(Src) 98.7 F (37.1 C) (Oral)  Ht 5\' 2"  (1.575 m)  Wt 130 lb 9.6 oz (59.24 kg)  BMI 23.89 kg/m2  SpO2 98%  Review of Systems Denies fever.      Objective:   Physical Exam VITAL SIGNS:  See vs page GENERAL: elderly, frail, no distress.   (i reviewed ua)    Assessment & Plan:  Uti, recurrent

## 2010-12-08 LAB — URINE CULTURE: Colony Count: >=100000

## 2010-12-10 ENCOUNTER — Telehealth: Payer: Self-pay | Admitting: *Deleted

## 2010-12-10 LAB — URINALYSIS, ROUTINE W REFLEX MICROSCOPIC
Glucose, UA: NEGATIVE
Ketones, ur: NEGATIVE
Nitrite: POSITIVE — AB
Protein, ur: NEGATIVE
Urobilinogen, UA: 0.2

## 2010-12-10 LAB — CBC
HCT: 40.1
HCT: 35.5 — ABNORMAL LOW
Hemoglobin: 11.9 — ABNORMAL LOW
Hemoglobin: 13.5
Hemoglobin: 11.9 — ABNORMAL LOW
MCHC: 33.5
Platelets: 92 — ABNORMAL LOW
RBC: 4.28
RDW: 14.2
RDW: 13.8
WBC: 7.4

## 2010-12-10 LAB — CULTURE, BLOOD (ROUTINE X 2)
Culture: NO GROWTH
Culture: NO GROWTH

## 2010-12-10 LAB — COMPREHENSIVE METABOLIC PANEL
ALT: 20
Albumin: 2.7 — ABNORMAL LOW
Alkaline Phosphatase: 48
BUN: 15
Calcium: 8.9
Chloride: 104
Creatinine, Ser: 1.04
Glucose, Bld: 90
Glucose, Bld: 93
Potassium: 3.5
Sodium: 139
Total Bilirubin: 0.8
Total Protein: 5.3 — ABNORMAL LOW
Total Protein: 5.3 — ABNORMAL LOW

## 2010-12-10 LAB — I-STAT 8, (EC8 V) (CONVERTED LAB)
Acid-Base Excess: 3 — ABNORMAL HIGH
Bicarbonate: 28.8 — ABNORMAL HIGH
Glucose, Bld: 105 — ABNORMAL HIGH
Potassium: 4.2
TCO2: 30
pCO2, Ven: 49.5
pH, Ven: 7.373 — ABNORMAL HIGH

## 2010-12-10 LAB — POCT CARDIAC MARKERS
CKMB, poc: <1 — ABNORMAL LOW
Myoglobin, poc: 116
Troponin i, poc: <0.05

## 2010-12-10 LAB — DIFFERENTIAL
Eosinophils Relative: 0
Lymphocytes Relative: 10 — ABNORMAL LOW
Monocytes Absolute: 0.5
Monocytes Relative: 7
Neutro Abs: 6.8

## 2010-12-10 LAB — URINE MICROSCOPIC-ADD ON

## 2010-12-10 NOTE — Telephone Encounter (Signed)
Message copied by Carin Primrose on Mon Dec 10, 2010  5:17 PM ------      Message from: Romero Belling      Created: Sun Dec 09, 2010  9:22 AM       i left message on phone tree      Culture says you should take cipro instead.  i have sent a prescription to your pharmacy

## 2010-12-10 NOTE — Telephone Encounter (Signed)
Left message for pt to callback office.  

## 2010-12-11 NOTE — Telephone Encounter (Signed)
Pt's daughter informed of MD's advisement for new ATB.

## 2010-12-12 ENCOUNTER — Telehealth: Payer: Self-pay

## 2010-12-12 IMAGING — CR DG CHEST 2V
2 series · 2 of 2 positions shown · non-contrast
Comparison: 11/18/2009

CLINICAL DATA: Fever.

CHEST - 2 VIEW

[view not recorded (1 of 2)]
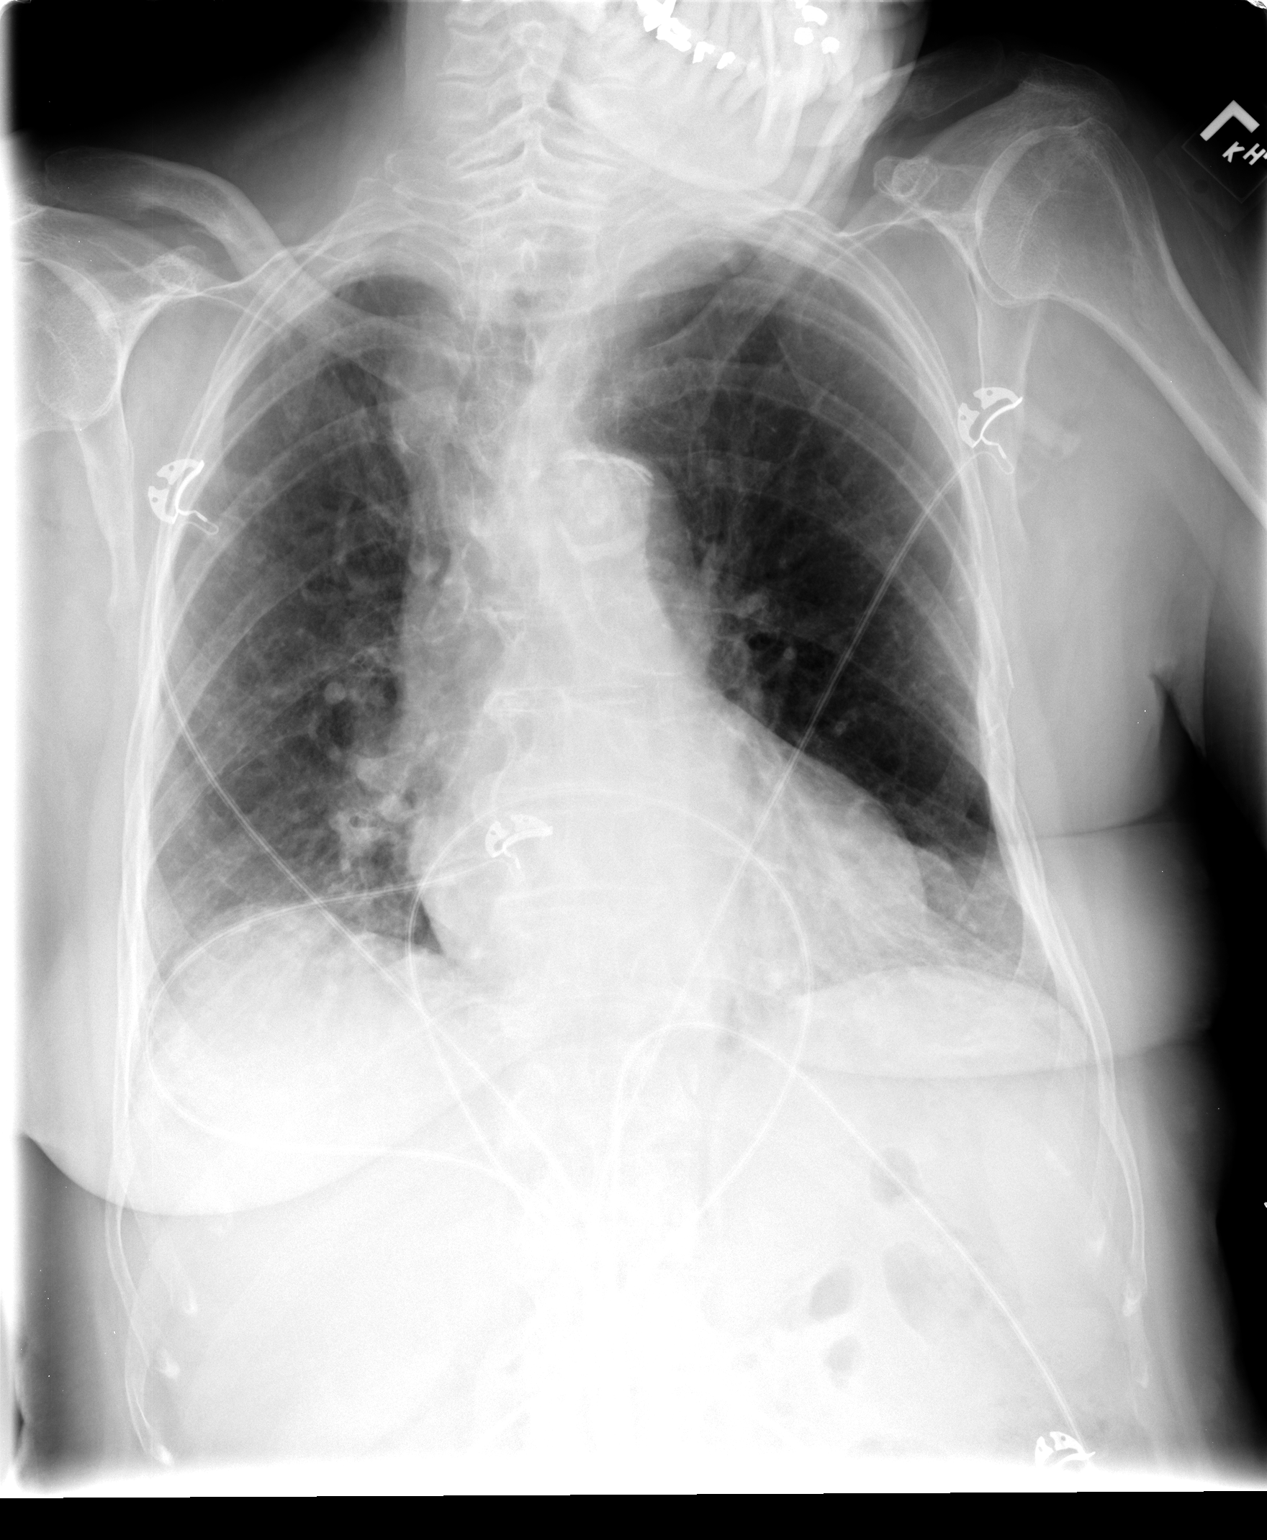

[view not recorded (2 of 2)]
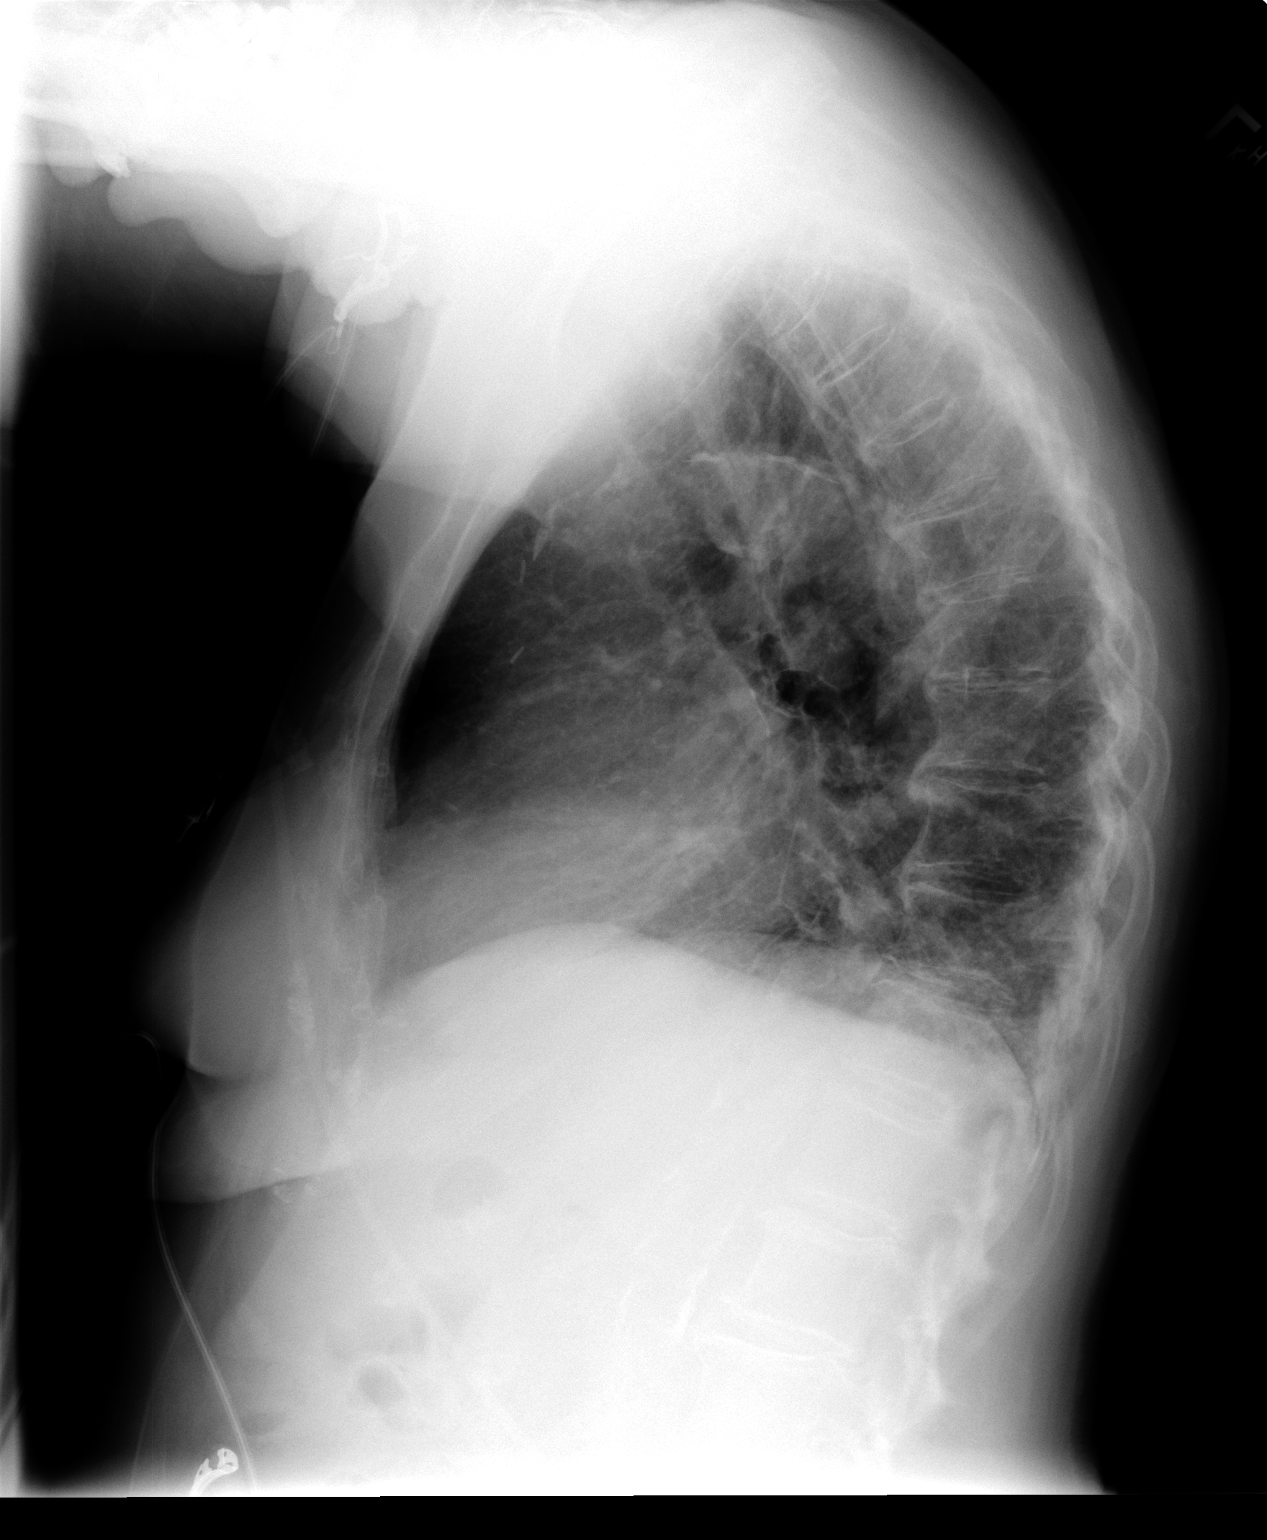

[2 of 2 positions shown; findings below may reference images not displayed]

FINDINGS: Mild cardiomegaly is identified.
Mild peribronchial thickening is stable.

Mild bibasilar opacities are again identified - suspect atelectasis
but pneumonia is not excluded.
There is no evidence of pleural effusion or pneumothorax.
No acute bony abnormalities are noted.
IMPRESSION: Relatively unchanged mild bibasilar opacities, likely representing
atelectasis but pneumonia is not entirely excluded.

Mild cardiomegaly.

## 2010-12-12 MED ORDER — CIPROFLOXACIN HCL 250 MG PO TABS: 250.0000 mg | ORAL_TABLET | Freq: Two times a day (BID) | ORAL | Status: AC

## 2010-12-12 NOTE — Telephone Encounter (Signed)
Daughter informed

## 2010-12-12 NOTE — Telephone Encounter (Signed)
Pt's daughter called regarding pt's Ucx results. Per Dr George Hugh last OV note, ABX for cipro was to be sent to pharmacy but Rx was not sent, Would you please Rx for pt? Thanks!

## 2010-12-12 NOTE — Telephone Encounter (Signed)
Ucx and sens reviewed - cipro erx done - thx

## 2010-12-14 ENCOUNTER — Encounter (INDEPENDENT_AMBULATORY_CARE_PROVIDER_SITE_OTHER): Payer: MEDICARE | Admitting: General Surgery

## 2010-12-26 ENCOUNTER — Ambulatory Visit (INDEPENDENT_AMBULATORY_CARE_PROVIDER_SITE_OTHER): Payer: MEDICARE | Admitting: General Surgery

## 2010-12-26 VITALS — BP 102/68 | HR 76 | Temp 97.3°F | Resp 16 | Ht 63.0 in | Wt 127.0 lb

## 2010-12-26 DIAGNOSIS — Z09 Encounter for follow-up examination after completed treatment for conditions other than malignant neoplasm: Secondary | ICD-10-CM

## 2010-12-26 NOTE — Progress Notes (Signed)
Patient returns now almost 4 months following ileocolonic resection for carcinoid tumor of the terminal ileum with partial obstruction. She did have 2 positive nodes and the peritoneal implants on the specimen but no gross disease elsewhere. She did come back initially hence all of my partners for early postop check. Overall she and her daughter feel like she is getting along pretty well. She is active and eating well. She still notices some occasional pain at night similar to what she had preoperatively but they feel this is less often and less severe.  On examination she appears well. Abdomen is soft and nontender. Wound is well healed.  Assessment and plan: Doing well following ileocolonic resection for carcinoid tumor. I will plan to see her in 4 months for more long-term followup.

## 2011-02-11 ENCOUNTER — Other Ambulatory Visit: Payer: Self-pay | Admitting: Internal Medicine

## 2011-02-18 ENCOUNTER — Other Ambulatory Visit: Payer: MEDICARE

## 2011-02-25 ENCOUNTER — Other Ambulatory Visit: Payer: Self-pay | Admitting: Oncology

## 2011-02-25 ENCOUNTER — Other Ambulatory Visit (HOSPITAL_BASED_OUTPATIENT_CLINIC_OR_DEPARTMENT_OTHER): Payer: MEDICARE | Admitting: Lab

## 2011-02-25 ENCOUNTER — Telehealth: Payer: Self-pay | Admitting: *Deleted

## 2011-02-25 ENCOUNTER — Ambulatory Visit (HOSPITAL_BASED_OUTPATIENT_CLINIC_OR_DEPARTMENT_OTHER): Payer: MEDICARE | Admitting: Oncology

## 2011-02-25 VITALS — BP 131/71 | HR 84 | Temp 98.0°F | Ht 63.0 in | Wt 128.6 lb

## 2011-02-25 DIAGNOSIS — Z170 Estrogen receptor positive status [ER+]: Secondary | ICD-10-CM

## 2011-02-25 DIAGNOSIS — C50219 Malignant neoplasm of upper-inner quadrant of unspecified female breast: Secondary | ICD-10-CM

## 2011-02-25 DIAGNOSIS — C7A019 Malignant carcinoid tumor of the small intestine, unspecified portion: Secondary | ICD-10-CM

## 2011-02-25 DIAGNOSIS — C50919 Malignant neoplasm of unspecified site of unspecified female breast: Secondary | ICD-10-CM

## 2011-02-25 LAB — CBC WITH DIFFERENTIAL/PLATELET
Eosinophils Absolute: 0 10*3/uL (ref 0.0–0.5)
HCT: 31.2 % — ABNORMAL LOW (ref 34.8–46.6)
HGB: 10.1 g/dL — ABNORMAL LOW (ref 11.6–15.9)
LYMPH%: 19.2 % (ref 14.0–49.7)
MONO#: 0.6 10*3/uL (ref 0.1–0.9)
NEUT#: 3.3 10*3/uL (ref 1.5–6.5)
NEUT%: 67.4 % (ref 38.4–76.8)
Platelets: 184 10*3/uL (ref 145–400)
RBC: 3.9 10*6/uL (ref 3.70–5.45)
WBC: 4.9 10*3/uL (ref 3.9–10.3)

## 2011-02-25 LAB — COMPREHENSIVE METABOLIC PANEL
Albumin: 4.1 g/dL (ref 3.5–5.2)
CO2: 27 mEq/L (ref 19–32)
Glucose, Bld: 111 mg/dL — ABNORMAL HIGH (ref 70–99)
Sodium: 142 mEq/L (ref 135–145)
Total Bilirubin: 0.6 mg/dL (ref 0.3–1.2)
Total Protein: 6.7 g/dL (ref 6.0–8.3)

## 2011-02-25 NOTE — Telephone Encounter (Signed)
gave patient appointment for 12-2011 printed out calendar and gave to the patient 

## 2011-02-25 NOTE — Progress Notes (Signed)
ID: Katelyn [name]   Interval History: Yaniris returns with her daughter Elease Hashimoto for followup of Larose's 2 cancers. The interval history is significant for the patient having turned 77. She had a big party at her church, with the cake, and lots of cards. She maintains an excellent functional status for her age, although of course she does not drive. She uses a cane whenever she is not at home.  ROS:  There have been no falls, no diarrhea or flushing, no abdominal pain or cramps, no nausea or vomiting, no taste alteration. There has also been no change that she has noted in either breast. She complains chiefly of hearing loss. She bruises easily. She has significant actinic change on her skin, which is stable. She complains of being forgetful and occasionally having some balance issues but again with no falls. A detailed review of systems was otherwise noncontributory  Medications: I have reviewed the patient's current medications.  Current Outpatient Prescriptions  Medication Sig Dispense Refill  . ALPRAZolam (XANAX) 0.25 MG tablet Take 1 tablet (0.25 mg total) by mouth at bedtime as needed and may repeat dose one time if needed for sleep or anxiety.  30 tablet  0  . amLODipine (NORVASC) 5 MG tablet Take 1 tablet (5 mg total) by mouth daily.  90 tablet  1  . anastrozole (ARIMIDEX) 1 MG tablet Take 1 by mouth daily      . atorvastatin (LIPITOR) 20 MG tablet Take 1 tablet (20 mg total) by mouth daily.  30 tablet  11  . Calcium-Vitamin D-Iron (CALCIUM 600 IRON/D) 308-657-84 MG-UNIT-MG TABS Take by mouth daily.        . metoprolol tartrate (LOPRESSOR) 25 MG tablet Take 1 tablet (25 mg total) by mouth 2 (two) times daily.  180 tablet  1  . Multiple Vitamins-Minerals (CENTRUM SILVER) tablet Take 1 tablet by mouth daily.        Marland Kitchen omeprazole (PRILOSEC) 40 MG capsule TAKE 1 CAPSULE BY MOUTH DAILY  90 capsule  1  . SYNTHROID 50 MCG tablet TAKE ONE TABLET BY MOUTH DAILY  30 tablet  4      Objective:  Filed Vitals:   02/25/11 0939  BP: 131/71  Pulse: 84  Temp: 98 F (36.7 C)    Physical Exam:    Sclerae unicteric  Oropharynx clear  No peripheral adenopathy  Lungs clear -- no rales or rhonchi  Heart regular rate and rhythm  Abdomen benign  MSK no focal spinal tenderness, no peripheral edema  Neuro nonfocal  Breast exam: Right breast status post lumpectomy no evidence of local recurrence. Left breast status post lumpectomy. I do not palpate any discreet mass.  Lab Results:   CMP    Chemistry      Component Value Date/Time   NA 137 11/20/2010 0856   K 3.7 11/20/2010 0856   CL 100 11/20/2010 0856   CO2 28 11/20/2010 0856   BUN 12 11/20/2010 0856   CREATININE 0.94 11/20/2010 0856      Component Value Date/Time   CALCIUM 9.5 11/20/2010 0856   ALKPHOS 82 11/20/2010 0856   AST 14 11/20/2010 0856   ALT 10 11/20/2010 0856   BILITOT 0.5 11/20/2010 0856       CBC Lab Results  Component Value Date   WBC 4.9 02/25/2011   HGB 10.1* 02/25/2011   HCT 31.2* 02/25/2011   MCV 80.2 02/25/2011   PLT 184 02/25/2011   NEUTROABS 3.3 02/25/2011    Studies/Results:  Mammography February of 2012 was unremarkable.  Assessment: 77 year old [place] woman with a history of  (1) remote right lumpectomy and radiation  (2) left lumpectomy and sentinel lymph node biopsy February of 2007 for a T1c NX Stage I invasive ductal carcinoma, grade 1, estrogen and progesterone receptor positive, HER-2 negative, treated initially with anastrozole with good tolerance, switched to tamoxifen June of 2008 for financial reasons  (3) another right-sided breast cancer biopsied March of 2012, measuring 1.1 cm by ultrasound, grade 3, with a clinically negative axilla, strongly estrogen and progesterone receptor positive, no evidence of HER-2 amplification, and MIB-1 of 21%; no definitive surgery, instead the patient was switched back to anastrozole, on which she continues  (4) small bowel mass  status post resection in June of 2012 proving to be a T3 N1, stage III well-differentiated carcinoid tumor.   Plan: She will have her next mammogram in February of 2013. She will see Dr. Johna Sheriff again in June of that year. She will see me again next October. Given her multiple other morbid conditions and her age we are not planning definitive surgery for the right-sided breast cancer, but only followup under anastrozole treatment. I refilled her prescription today. I similarly, in the absence of any symptoms or signs from her carcinoid no further workup is warranted.  Ladell Lea C 02/25/2011

## 2011-03-19 DIAGNOSIS — K8020 Calculus of gallbladder without cholecystitis without obstruction: Secondary | ICD-10-CM

## 2011-03-19 HISTORY — DX: Calculus of gallbladder without cholecystitis without obstruction: K80.20

## 2011-04-10 ENCOUNTER — Other Ambulatory Visit: Payer: Self-pay | Admitting: Internal Medicine

## 2011-05-16 ENCOUNTER — Other Ambulatory Visit: Payer: Self-pay | Admitting: Oncology

## 2011-05-16 ENCOUNTER — Ambulatory Visit
Admission: RE | Admit: 2011-05-16 | Discharge: 2011-05-16 | Disposition: A | Discharge status: Home / Self Care | Payer: MEDICARE | Source: Ambulatory Visit | Attending: Oncology | Admitting: Oncology

## 2011-05-16 DIAGNOSIS — Z853 Personal history of malignant neoplasm of breast: Secondary | ICD-10-CM

## 2011-05-17 ENCOUNTER — Encounter (INDEPENDENT_AMBULATORY_CARE_PROVIDER_SITE_OTHER): Payer: MEDICARE | Admitting: General Surgery

## 2011-05-31 ENCOUNTER — Encounter (INDEPENDENT_AMBULATORY_CARE_PROVIDER_SITE_OTHER): Payer: Self-pay | Admitting: General Surgery

## 2011-05-31 ENCOUNTER — Ambulatory Visit (INDEPENDENT_AMBULATORY_CARE_PROVIDER_SITE_OTHER): Payer: MEDICARE | Admitting: General Surgery

## 2011-05-31 VITALS — BP 131/62 | HR 70 | Temp 98.4°F | Ht 61.0 in | Wt 130.8 lb

## 2011-05-31 DIAGNOSIS — C50919 Malignant neoplasm of unspecified site of unspecified female breast: Secondary | ICD-10-CM

## 2011-05-31 DIAGNOSIS — C7A019 Malignant carcinoid tumor of the small intestine, unspecified portion: Secondary | ICD-10-CM

## 2011-05-31 NOTE — Progress Notes (Signed)
Chief complaint: Followup carcinoid tumor of ileum and abdominal pain  History: Patient returns for more long-term followup status post laparotomy and resection of terminal ileum for a partially obstructing carcinoid tumor 1.7 cm with 2 of 9 nodes positive. She presented with worsening abdominal pain. She also has a known approximately 1 cm cancer of the right breast being treated with hormonal therapy and followed by Dr Darnelle Catalan. She is still having some episodic lower abdominal pain but it is much less severe and frequent and she was having prior to her surgery. She describes some pressure-like pain across her lower abdomen it usually occurs very early in the morning but as soon as she gets up and walks around or sometimes just rubbing the area he will go away. This is much better than she had been feeling. She is active, getting up and around, and going to church.  Exam: General: Elderly alert and well-appearing female HEENT: No palpable masses. Sclera nonicteric Lymph nodes: No palpable cervical, supraclavicular, or inguinal nodes Breasts: Large defect upper outer quadrant of left breast status post lumpectomy. No worrisome masses. I cannot feel any definite masses in the right breast. Abdomen: Well-healed incisions. No hernias. Bowel sounds are hyperactive. Abdomen is soft and nontender without discernible masses, no distention, no organomegaly.  Assessment and plan: #1 colon status post resection of malignant carcinoid of the terminal ileum. She still has some abdominal complaints but these are minimal compared to preop it could certainly be due to gas or adhesions. I discussed the possibility of a repeat CT with the patient and her daughter but we all agree that as well as she is doing we will hold off and just observe clinically for now.  #2 cancer right breast, decreasing on hormonal treatment. No surgery for now.  I asked her to return in 6 months.

## 2011-06-20 IMAGING — CR DG ABDOMEN 1V
1 series · 1 of 1 positions shown · non-contrast
Comparison: None

CLINICAL DATA: Assess for small bowel capsule

ABDOMEN - 1 VIEW

[view not recorded]
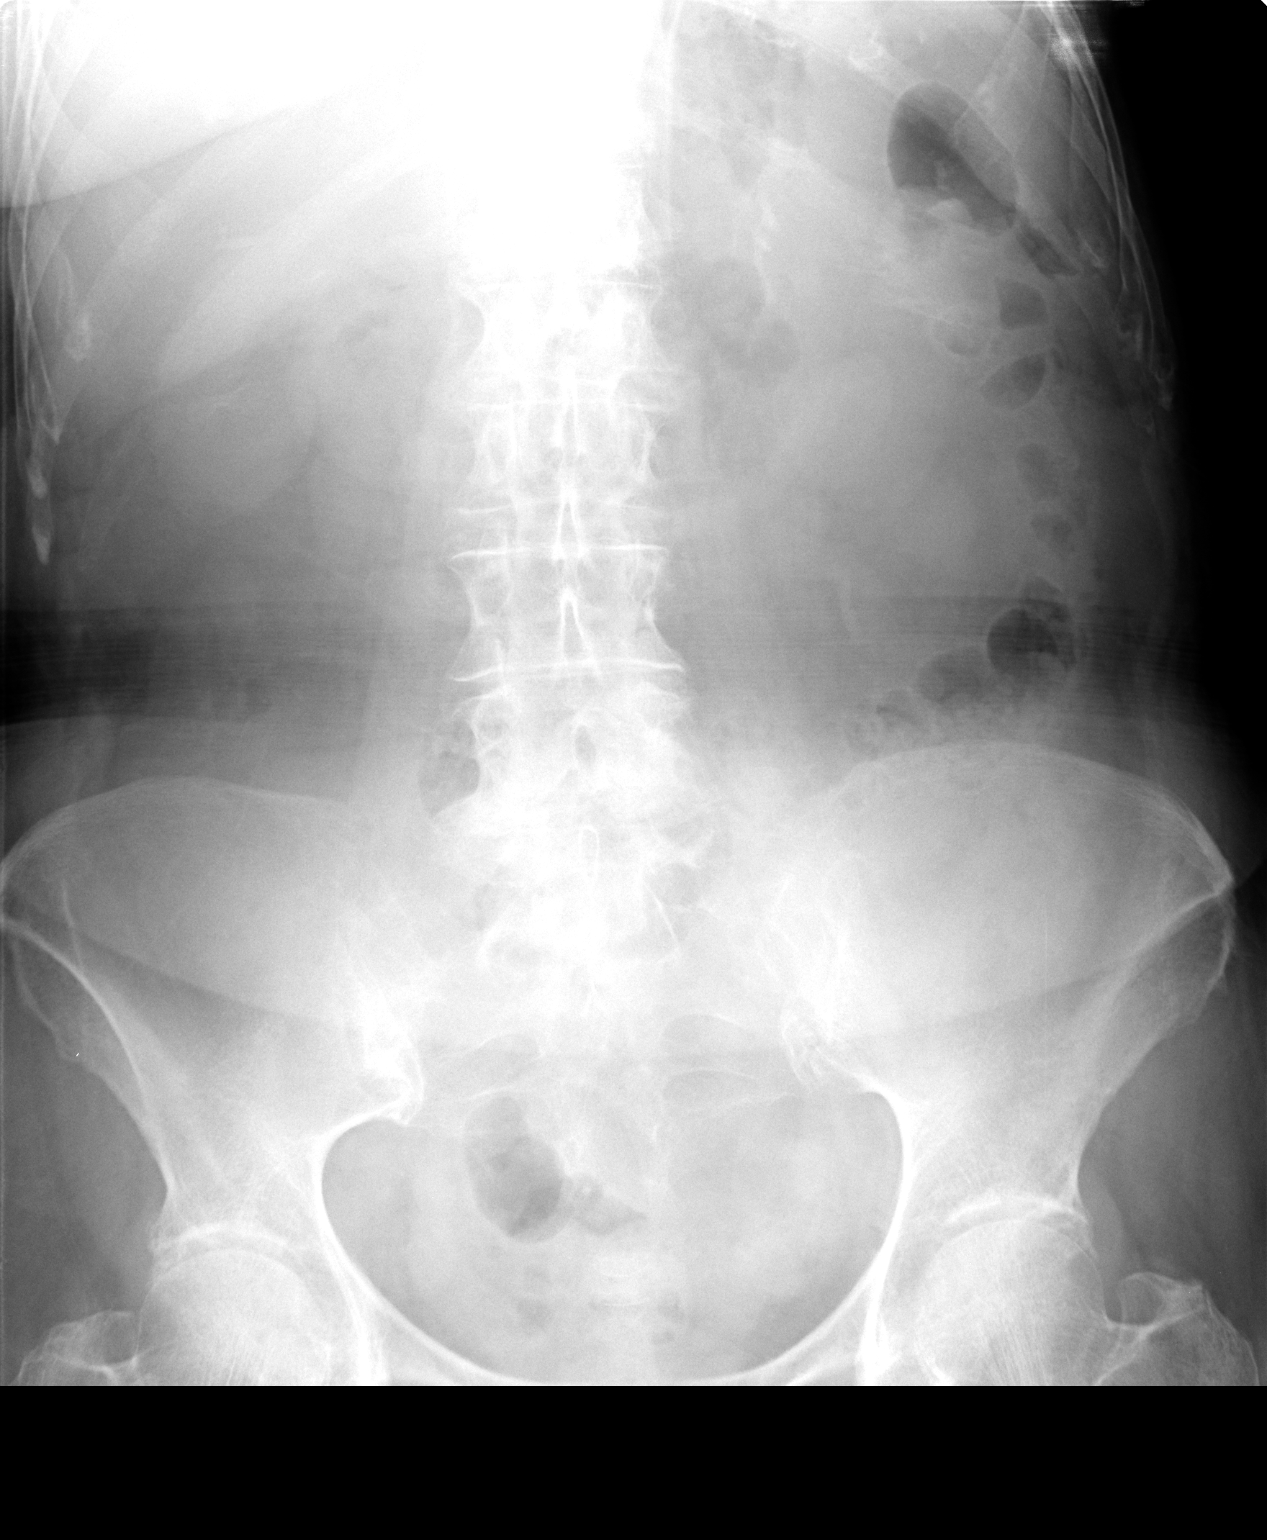

[1 of 1 positions shown; findings below may reference images not displayed]

FINDINGS: No sign of radiopaque foreign object.  Bowel gas pattern
is normal.  Ordinary degenerative changes effect the spine.
IMPRESSION: No radiopaque foreign object evident.

## 2011-08-08 ENCOUNTER — Other Ambulatory Visit: Payer: Self-pay | Admitting: Internal Medicine

## 2011-08-08 ENCOUNTER — Other Ambulatory Visit (INDEPENDENT_AMBULATORY_CARE_PROVIDER_SITE_OTHER): Payer: MEDICARE

## 2011-08-08 ENCOUNTER — Ambulatory Visit (INDEPENDENT_AMBULATORY_CARE_PROVIDER_SITE_OTHER): Payer: MEDICARE | Admitting: Internal Medicine

## 2011-08-08 ENCOUNTER — Encounter: Payer: Self-pay | Admitting: Internal Medicine

## 2011-08-08 VITALS — BP 142/62 | HR 80 | Temp 97.5°F | Ht 61.0 in | Wt 131.8 lb

## 2011-08-08 DIAGNOSIS — R350 Frequency of micturition: Secondary | ICD-10-CM

## 2011-08-08 DIAGNOSIS — E039 Hypothyroidism, unspecified: Secondary | ICD-10-CM

## 2011-08-08 DIAGNOSIS — N3289 Other specified disorders of bladder: Secondary | ICD-10-CM

## 2011-08-08 DIAGNOSIS — F411 Generalized anxiety disorder: Secondary | ICD-10-CM

## 2011-08-08 DIAGNOSIS — F419 Anxiety disorder, unspecified: Secondary | ICD-10-CM

## 2011-08-08 LAB — URINALYSIS, ROUTINE W REFLEX MICROSCOPIC
Nitrite: POSITIVE
Specific Gravity, Urine: 1.01 (ref 1.000–1.030)
Total Protein, Urine: NEGATIVE
Urine Glucose: NEGATIVE
pH: 6 (ref 5.0–8.0)

## 2011-08-08 LAB — TSH: TSH: 0.5 u[IU]/mL (ref 0.35–5.50)

## 2011-08-08 LAB — CBC WITH DIFFERENTIAL/PLATELET
Basophils Relative: 0.2 % (ref 0.0–3.0)
Eosinophils Relative: 0.5 % (ref 0.0–5.0)
Lymphocytes Relative: 24.2 % (ref 12.0–46.0)
MCV: 93.3 fl (ref 78.0–100.0)
Monocytes Relative: 13.9 % — ABNORMAL HIGH (ref 3.0–12.0)
Neutrophils Relative %: 61.2 % (ref 43.0–77.0)
RBC: 4.16 Mil/uL (ref 3.87–5.11)
WBC: 6.3 10*3/uL (ref 4.5–10.5)

## 2011-08-08 MED ORDER — SOLIFENACIN SUCCINATE 5 MG PO TABS: 5.0000 mg | ORAL_TABLET | Freq: Every day | ORAL | Status: DC

## 2011-08-08 MED ORDER — ALPRAZOLAM 0.25 MG PO TABS: 0.2500 mg | ORAL_TABLET | Freq: Two times a day (BID) | ORAL | Status: DC | PRN

## 2011-08-08 NOTE — Patient Instructions (Signed)
It was good to see you today. Test(s) ordered today. Your results will be called to you after review (48-72hours after test completion). If any changes need to be made, you will be notified at that time. Start Vesicare bladder spasms and resume xanax 2x/day as needed for anxiety and spasm pain- Your prescription(s) have been submitted to your pharmacy. Please take as directed and contact our office if you believe you are having problem(s) with the medication(s). Please schedule followup in 4-6 months for recheck, call sooner if problems.

## 2011-08-08 NOTE — Assessment & Plan Note (Signed)
Resume prn xanax

## 2011-08-08 NOTE — Progress Notes (Signed)
Subjective:    Patient ID: Katelyn [name], female    DOB: 11-05-1918, 78 y.o.   MRN: [id number]  HPI  Here with urinary problems Onset >6 mo ago chronic suprapubic pain and burning No fever or dysuria. No gross hematuria associated with frequency and small volume voiding Hx same associated with UTI  Also reviewed chronic medical issues:  recurrent breast ca on R 04/2010 med change from tamoxifen to armidex early 26-Aug-2010 -No surg planned for breast ca.  carcinoid vs other SB cancer - dx 04/2010 with endo/capsule reports  - s/p surg resection  HTN - reports compliance with ongoing medical treatment. denies adverse side effects related to current therapy. prev changed toprol xr to two times a day for cost concerns   hypothyroid - has followed with endo for same but would like to follow here - reports compliance with ongoing medical treatment but freq changes in medication dose or frequency. denies adverse side effects related to current therapy.   dyslipidemia - reports compliance with ongoing medical treatment and no changes in medication dose or frequency. denies adverse side effects related to current therapy.   Past Medical History  Diagnosis Date  . Hypertension   . Hypothyroidism   . Hx of colonic polyps     RLQ mensenteric mass-1.4cmx2.1cm CT 02/20/10 ? Carcinoid  . Hyperlipidemia   . Depression 08/26/1999    following death of spouse  . Hiatal hernia   . Clostridium difficile colitis   . Anxiety   . Anemia   . Parkinson disease   . Hx of adenomatous colonic polyps 06/25/01  . GERD (gastroesophageal reflux disease)   . Breast cancer 1995    (R) lumpectomy, XT and tomifen- Aug 26, 2003 (L) partial mastectomy with tamoxifen; recurr R 04/2010  . Carcinoid tumor, malignant 04/2010 SB capsule dx    09/03/10 resection; 2/9 positive LN  . Breast cancer 05/24/10    recurrence  . Atrial fibrillation     peri/post op 08/2010 - resumed NSR    Review of Systems  Respiratory: Negative for cough.     Cardiovascular: Negative for chest pain.  Genitourinary: Positive for dysuria and frequency. Negative for flank pain, vaginal bleeding and difficulty urinating.       Objective:   Physical Exam  BP 142/62  Pulse 80  Temp(Src) 97.5 F (36.4 C) (Oral)  Ht 5\' 1"  (1.549 m)  Wt 131 lb 12.8 oz (59.784 kg)  BMI 24.90 kg/m2  SpO2 98% Wt Readings from Last 3 Encounters:  08/08/11 131 lb 12.8 oz (59.784 kg)  05/31/11 130 lb 12.8 oz (59.33 kg)  02/25/11 128 lb 9.6 oz (58.333 kg)   Constitutional: She appears well-developed and well-nourished. No distress. Dtr in law at side HENT: Very HOH!  Cardiovascular: Normal rate, regular rhythm and normal heart sounds.  No murmur heard. No BLE edema. Pulmonary/Chest: Effort normal and breath sounds normal. No respiratory distress. She has no wheezes.  Abdominal: Soft. Bowel sounds are normal. She exhibits no distension. There is no suprapubic tenderness.  Psychiatric: She has an anxious mood and affect. Her behavior is normal. Judgment and thought content normal.       Lab Results  Component Value Date   WBC 4.9 02/25/2011   HGB 10.1* 02/25/2011   HCT 31.2* 02/25/2011   PLT 184 02/25/2011   CHOL 155 06/06/2010   TRIG 114.0 06/06/2010   HDL 69.70 06/06/2010   ALT 12 02/25/2011   AST 15 02/25/2011   NA 142 02/25/2011  K 3.8 02/25/2011   CL 103 02/25/2011   CREATININE 1.03 02/25/2011   BUN 15 02/25/2011   CO2 27 02/25/2011   TSH 3.31 10/15/2010   HGBA1C 5.9 04/28/2009     Assessment & Plan:  Urinary problems - ?UTI  - check UA/Ucx and consider empiric Cipro bid x 7 d - also start vesicare for overactive bladder symptoms and spasm

## 2011-08-08 NOTE — Assessment & Plan Note (Signed)
Previously folllowed with endo (kumar) but now follows here Check TSH now  Lab Results  Component Value Date   TSH 3.31 10/15/2010

## 2011-08-09 ENCOUNTER — Telehealth: Payer: Self-pay

## 2011-08-09 MED ORDER — CIPROFLOXACIN HCL 250 MG PO TABS: 250.0000 mg | ORAL_TABLET | Freq: Two times a day (BID) | ORAL | Status: AC

## 2011-08-09 NOTE — Progress Notes (Signed)
Addended by: Rene Paci A on: 08/09/2011 09:06 AM   Modules accepted: Orders

## 2011-08-09 NOTE — Telephone Encounter (Signed)
Left message on voicemail with Dr.Leschber recommendations. I also left message on patient's daughter voicemail informing her rx was sent in for her mom and to verify that she will pick-up and begin taking. Instructed to call the office on Tuesday if question or conerns

## 2011-08-09 NOTE — Telephone Encounter (Signed)
Message copied by Maurice Small on Fri Aug 09, 2011  4:50 PM ------      Message from: Rock Nephew T      Created: Fri Aug 09, 2011  9:13 AM                   ----- Message -----         From: Newt Lukes, MD         Sent: 08/09/2011   9:07 AM           To: [name], MA            UA shows UTI - will start 7d abx (cipro 250 bis) - erx done to [facility] - will call again when Ucx final      Cont other meds as rx'd at [facility] yesterday - thanks

## 2011-08-11 ENCOUNTER — Other Ambulatory Visit: Payer: Self-pay | Admitting: Internal Medicine

## 2011-08-13 ENCOUNTER — Other Ambulatory Visit: Payer: Self-pay | Admitting: *Deleted

## 2011-08-13 MED ORDER — METOPROLOL TARTRATE 25 MG PO TABS: 25.0000 mg | ORAL_TABLET | Freq: Two times a day (BID) | ORAL | Status: DC

## 2011-08-13 MED ORDER — AMLODIPINE BESYLATE 5 MG PO TABS: 5.0000 mg | ORAL_TABLET | Freq: Every day | ORAL | Status: DC

## 2011-08-29 ENCOUNTER — Other Ambulatory Visit: Payer: Self-pay | Admitting: Internal Medicine

## 2011-08-30 ENCOUNTER — Telehealth: Payer: Self-pay | Admitting: Internal Medicine

## 2011-08-30 NOTE — Telephone Encounter (Signed)
Options: Cover with neosporin and bandage, if it i a small wound appt tomorrow

## 2011-08-30 NOTE — Telephone Encounter (Signed)
Pt's daughter informed of MD's Jerry Caras states that they went to Urgent Care for wound since there were no appointments available for today.

## 2011-08-30 NOTE — Telephone Encounter (Signed)
Please advise in Dr Leschber's absence, thanks! 

## 2011-08-30 NOTE — Telephone Encounter (Signed)
Caller: Patricia/Child; PCP: Rene Paci; CB#: [phone];  Call regarding Cuts, Scrapes, Abrasions; 4"x4" skin tear to L forearm after a fall last PM. Weepy type bleeding controlled with a gauze bandage. See in 4 per [facility] Protocol. No appts. Pls call.

## 2011-09-06 ENCOUNTER — Telehealth: Payer: Self-pay | Admitting: *Deleted

## 2011-09-06 MED ORDER — ANASTROZOLE 1 MG PO TABS: 1.0000 mg | ORAL_TABLET | Freq: Every day | ORAL | Status: DC

## 2011-09-11 NOTE — Telephone Encounter (Signed)
error 

## 2011-09-24 ENCOUNTER — Encounter (INDEPENDENT_AMBULATORY_CARE_PROVIDER_SITE_OTHER): Payer: Self-pay | Admitting: General Surgery

## 2011-10-08 ENCOUNTER — Other Ambulatory Visit: Payer: Self-pay | Admitting: Internal Medicine

## 2011-11-29 ENCOUNTER — Ambulatory Visit (INDEPENDENT_AMBULATORY_CARE_PROVIDER_SITE_OTHER): Payer: MEDICARE | Admitting: General Surgery

## 2011-11-29 ENCOUNTER — Encounter (INDEPENDENT_AMBULATORY_CARE_PROVIDER_SITE_OTHER): Payer: Self-pay | Admitting: General Surgery

## 2011-11-29 VITALS — BP 130/72 | HR 66 | Temp 97.6°F | Ht 64.0 in | Wt 131.6 lb

## 2011-11-29 DIAGNOSIS — D3A00 Benign carcinoid tumor of unspecified site: Secondary | ICD-10-CM

## 2011-11-29 DIAGNOSIS — D3A098 Benign carcinoid tumors of other sites: Secondary | ICD-10-CM | POA: Insufficient documentation

## 2011-11-29 NOTE — Patient Instructions (Signed)
I will call you with results of the x-ray and lab

## 2011-11-29 NOTE — Progress Notes (Signed)
Chief complaint: Abdominal pain and follow up carcinoid tumor  History: Patient returns for followup with history of carcinoid tumor of the small intestine with positive nodes just over one year following laparotomy and small bowel resection. She presented at that time with abdominal pain. She also has a ER positive cancer of the right breast that is being treated with hormones alone. She continues to have some abdominal pain which she has had really since before and after surgery although it seemed better after surgery.  This is the same symptom complex she has experienced for some time with crampy like or aching pain across her lower abdomen that occurs only in the middle the night and is better when she gets up and walks and goes away after a couple of hours. It does not occur during the day. She is eating without difficulty. No nausea or vomiting. Bowel movements her father she says they are occasionally black. She has not noted any pain or lump or skin changes in her breast.  Past Medical History  Diagnosis Date  . Hypertension   . Hypothyroidism   . Hx of colonic polyps     RLQ mensenteric mass-1.4cmx2.1cm CT 02/20/10 ? Carcinoid  . Hyperlipidemia   . Depression 08/19/99    following death of spouse  . Hiatal hernia   . Clostridium difficile colitis   . Anxiety   . Anemia   . Parkinson disease   . Hx of adenomatous colonic polyps 06/25/01  . GERD (gastroesophageal reflux disease)   . Breast cancer 1995    (R) lumpectomy, XT and tomifen- 08-19-2003 (L) partial mastectomy with tamoxifen; recurr R 04/2010  . Carcinoid tumor, malignant 04/2010 SB capsule dx    09/03/10 resection; 2/9 positive LN  . Breast cancer 05/24/10    recurrence  . Atrial fibrillation     peri/post op 08/2010 - resumed NSR   Past Surgical History  Procedure Date  . Abdominal hysterectomy 1969  . Back surgery 1959  . Breast surgery 1995    (R) Lumpectomy  . Mastectomy, partial 08/19/2003    left   Current Outpatient  Prescriptions  Medication Sig Dispense Refill  . ALPRAZolam (XANAX) 0.25 MG tablet Take 1 tablet (0.25 mg total) by mouth 2 (two) times daily as needed for sleep or anxiety.  40 tablet  1  . amLODipine (NORVASC) 5 MG tablet Take 1 tablet (5 mg total) by mouth daily.  90 tablet  1  . anastrozole (ARIMIDEX) 1 MG tablet Take 1 tablet (1 mg total) by mouth daily. Take 1 by mouth daily  90 tablet  1  . atorvastatin (LIPITOR) 20 MG tablet Take 20 mg by mouth daily.      . Calcium-Vitamin D-Iron (CALCIUM 600 IRON/D) 960-454-09 MG-UNIT-MG TABS Take by mouth daily.        Marland Kitchen levothyroxine (SYNTHROID, LEVOTHROID) 50 MCG tablet TAKE ONE TABLET BY MOUTH DAILY  30 tablet  5  . metoprolol tartrate (LOPRESSOR) 25 MG tablet Take 1 tablet (25 mg total) by mouth 2 (two) times daily.  180 tablet  1  . Multiple Vitamins-Minerals (CENTRUM SILVER) tablet Take 1 tablet by mouth daily.        Marland Kitchen omeprazole (PRILOSEC) 40 MG capsule TAKE 1 CAPSULE BY MOUTH DAILY  90 capsule  1  . DISCONTD: atorvastatin (LIPITOR) 20 MG tablet Take 1 tablet (20 mg total) by mouth daily.  30 tablet  11   No Known Allergies  Exam: Gen.: Elderly alert white female in  no distress Lymph nodes: No cervical, supraclavicular, inguinal Lungs: Clear breath sounds bilaterally Breasts: I cannot feel any mass in the right breast. Lumpectomy defect in the upper left breast without mass. Abdomen: Well-healed incision. Nondistended. There is mild tenderness in the right lower quadrant. No palpable masses or organomegaly or hernias. Neurologic: Alert and oriented.  Imaging: Ultrasound of the right breast earlier this year showed decrease in her right breast mass from 1.1 to 0.8cm  Assessment and plan: #1 status post small bowel resection for partially obstructing carcinoid tumor with positive nodes. She continues to have abdominal complaints which are long-standing. I'm not sure these were ever related to her carcinoid tumor. However I think is  reasonable to repeat her CT scan now out over one year following surgery to make sure there is no evidence of recurrence or partial obstruction or other issues. We will do this nor call with results.  History of black stools Will check Hemoccults although her hemoglobin is normal in May.  #2 ER-positive right breast cancer on hormonal treatment only with reduction in the size of the tumor and negative physical exam  Call the patient and her daughter with results of these studies and I will see her back no evidence 6 months.

## 2011-12-02 ENCOUNTER — Other Ambulatory Visit: Payer: MEDICARE

## 2011-12-03 ENCOUNTER — Other Ambulatory Visit (INDEPENDENT_AMBULATORY_CARE_PROVIDER_SITE_OTHER): Payer: Self-pay

## 2011-12-04 ENCOUNTER — Ambulatory Visit
Admission: RE | Admit: 2011-12-04 | Discharge: 2011-12-04 | Disposition: A | Discharge status: Home / Self Care | Payer: MEDICARE | Source: Ambulatory Visit | Attending: General Surgery | Admitting: General Surgery

## 2011-12-04 DIAGNOSIS — D3A00 Benign carcinoid tumor of unspecified site: Secondary | ICD-10-CM

## 2011-12-04 MED ORDER — IOHEXOL 300 MG/ML  SOLN
100.0000 mL | Freq: Once | INTRAMUSCULAR | Status: AC | PRN
  Administered 2011-12-04: 100 mL via INTRAVENOUS

## 2011-12-05 ENCOUNTER — Telehealth (INDEPENDENT_AMBULATORY_CARE_PROVIDER_SITE_OTHER): Payer: Self-pay | Admitting: General Surgery

## 2011-12-05 NOTE — Telephone Encounter (Signed)
Called about CT results, no answer

## 2011-12-06 ENCOUNTER — Other Ambulatory Visit (INDEPENDENT_AMBULATORY_CARE_PROVIDER_SITE_OTHER): Payer: Self-pay

## 2011-12-06 DIAGNOSIS — K8020 Calculus of gallbladder without cholecystitis without obstruction: Secondary | ICD-10-CM

## 2011-12-12 ENCOUNTER — Telehealth (INDEPENDENT_AMBULATORY_CARE_PROVIDER_SITE_OTHER): Payer: Self-pay | Admitting: General Surgery

## 2011-12-12 ENCOUNTER — Ambulatory Visit
Admission: RE | Admit: 2011-12-12 | Discharge: 2011-12-12 | Disposition: A | Discharge status: Home / Self Care | Payer: MEDICARE | Source: Ambulatory Visit | Attending: General Surgery | Admitting: General Surgery

## 2011-12-12 DIAGNOSIS — K8020 Calculus of gallbladder without cholecystitis without obstruction: Secondary | ICD-10-CM

## 2011-12-12 NOTE — Telephone Encounter (Signed)
Called and left a message with daughter about ultrasound results and asked her to call back

## 2011-12-23 ENCOUNTER — Telehealth: Payer: Self-pay | Admitting: Oncology

## 2011-12-23 ENCOUNTER — Encounter: Payer: Self-pay | Admitting: Physician Assistant

## 2011-12-23 ENCOUNTER — Ambulatory Visit (HOSPITAL_BASED_OUTPATIENT_CLINIC_OR_DEPARTMENT_OTHER): Payer: MEDICARE | Admitting: Physician Assistant

## 2011-12-23 ENCOUNTER — Other Ambulatory Visit (HOSPITAL_BASED_OUTPATIENT_CLINIC_OR_DEPARTMENT_OTHER): Payer: MEDICARE | Admitting: Lab

## 2011-12-23 VITALS — BP 113/67 | HR 60 | Temp 97.4°F | Resp 20 | Ht 64.0 in | Wt 134.9 lb

## 2011-12-23 DIAGNOSIS — C50911 Malignant neoplasm of unspecified site of right female breast: Secondary | ICD-10-CM | POA: Insufficient documentation

## 2011-12-23 DIAGNOSIS — C50919 Malignant neoplasm of unspecified site of unspecified female breast: Secondary | ICD-10-CM

## 2011-12-23 DIAGNOSIS — C50219 Malignant neoplasm of upper-inner quadrant of unspecified female breast: Secondary | ICD-10-CM

## 2011-12-23 DIAGNOSIS — C7A019 Malignant carcinoid tumor of the small intestine, unspecified portion: Secondary | ICD-10-CM

## 2011-12-23 DIAGNOSIS — C7A012 Malignant carcinoid tumor of the ileum: Secondary | ICD-10-CM

## 2011-12-23 LAB — COMPREHENSIVE METABOLIC PANEL (CC13)
AST: 17 U/L (ref 5–34)
Albumin: 3.3 g/dL — ABNORMAL LOW (ref 3.5–5.0)
Alkaline Phosphatase: 95 U/L (ref 40–150)
Glucose: 106 mg/dl — ABNORMAL HIGH (ref 70–99)
Potassium: 3.5 mEq/L (ref 3.5–5.1)
Sodium: 135 mEq/L — ABNORMAL LOW (ref 136–145)
Total Bilirubin: 0.7 mg/dL (ref 0.20–1.20)
Total Protein: 6.5 g/dL (ref 6.4–8.3)

## 2011-12-23 LAB — CBC WITH DIFFERENTIAL/PLATELET
BASO%: 0.5 % (ref 0.0–2.0)
EOS%: 0.2 % (ref 0.0–7.0)
Eosinophils Absolute: 0 10*3/uL (ref 0.0–0.5)
LYMPH%: 17.4 % (ref 14.0–49.7)
MCH: 33 pg (ref 25.1–34.0)
MCHC: 34 g/dL (ref 31.5–36.0)
MCV: 96.8 fL (ref 79.5–101.0)
MONO%: 13.5 % (ref 0.0–14.0)
NEUT#: 4.2 10*3/uL (ref 1.5–6.5)
Platelets: 147 10*3/uL (ref 145–400)
RBC: 3.97 10*6/uL (ref 3.70–5.45)
RDW: 13.8 % (ref 11.2–14.5)

## 2011-12-23 MED ORDER — ANASTROZOLE 1 MG PO TABS: 1.0000 mg | ORAL_TABLET | Freq: Every day | ORAL | Status: DC

## 2011-12-23 NOTE — Patient Instructions (Signed)
Continue anastrazole - refilled today.  Mammo in Feb 2014.  Dr. Johna Sheriff for routine follow up in March 2014.  Return in Sept 2014 for labs and follow up exam.  Call with problems ([phone])

## 2011-12-23 NOTE — Telephone Encounter (Signed)
gv pt dtr appt schedule for September 2014 and mammo/us for 05/18/12. dtr aware pt will be contacted by [facility] re appt w/Dr. Johna Sheriff in March 2014. S/w Debra @ [facility] and pt already on recall for March 2014.

## 2011-12-23 NOTE — Progress Notes (Signed)
ID: Katelyn [name]   DOB: 04-17-1918  MR#: [id number]  WGN#:[id number]  PCP: Katelyn Paci, MD GYN:  SU:  OTHER MD:   HISTORY OF PRESENT ILLNESS: Ms. Katelyn [name] had a screening mammogram on April 03, 2005, which showed questionable distortion in the left breast.  She was brought for additional views on January 24.  By physical exam Dr. Cain [name] was able to palpate a little bit of thickening in the outer portion of the left breast.  By ultrasound this area was spiculated and solid with posterior shadowing and measured up to 1.3 cm.  Accordingly core biopsy was obtained the same day and showed ([id number] and [phone]) an invasive breast cancer, which was strongly ER positive, PR positive at 37% with a moderate proliferation marker at 17% and HER-2/neu at 2+.  FISH, however, showed low-level polysomy with a ratio of only 1.1 and so essentially negative.  With this information the patient was referred to Dr. Maryagnes [name] and on April 26, 2005 bilateral breast MRI was obtained.  This showed only the solitary mass in the upper aspect of the left breast, measuring by MRI up to 1.4 cm.  Accordingly after appropriate discussion Dr. Maryagnes [name] proceeded to left lumpectomy and lymph node sampling May 08, 2005.  This showed ([id number]) a 1.5 cm infiltrating ductal carcinoma, which was grade 1.  Margins were negative.  There was no evidence of lymphovascular invasion and 0 of 4 sentinel lymph nodes were involved.  She was treated with anastrozole initially, then switched to tamoxifen as of 2008 for financial reasons.  In 2012, the patient had routine bilateral mammography May 14, 2010 which showed  left postsurgical scarring; however, on the right there was a new oval mass, not associated with any calcifications.  Dr Katelyn [name] was not able to palpate an abnormality in that area, but ultrasound confirmed a 1.1 cm lobulated hypoechoic mass at the 1 o'clock position in the right breast, 6 cm from the nipple.  He did not  find any evidence of axillary adenopathy.  This was felt sufficiently suspicious to warrant biopsy and this was performed March 7, with the pathology ([id number]) showing a grade 3 invasive ductal carcinoma which was estrogen receptor positive at 96%, progesterone receptor poor at 2%, with an MIB-1 of 21% and no evidence of amplification of HER-2 by CISH, with a ratio of 0.9.   The patient did not undergo definitive surgery, but chose to be treated neoadjuvant leg. She was switched back to anastrozole in March of 2012 on which she continues.   At the same time that the patient was being worked up for this, she had been complaining of abdominal discomfort and on February 20, 2010, a CT of the abdomen and pelvis was obtained through the emergency room showing, in addition to a small hiatal hernia and no evidence of liver involvement, a spiculated mass in the right lower quadrant mesentery.  This mass had been previously noted (December 2006) but had increased in size, now measuring 2.1 cm, versus 1.3 cm previously.  The possibility of a carcinoid tumor or a low-grade lymphoma was suggested.    The patient was referred to Dr Katelyn [name], who evaluated the patient May 07, 2010, for problems with diarrhea and abdominal pain.  There is, of course, a strong family history of colon cancer and the patient also has a prior history of C. difficile colitis.  Dr Katelyn [name] felt that a capsule endoscopy was the best test to obtain and this was performed on February  28, 08-26-10.  This study showed a mass protruding into the lumen of the small bowel, occupying one third of the lumen, with a second mass in a separate segment.  The capsule was held at that segment for some time before passing into the cecum.  She is status post small bowel mass resection in June 2012 proving to be T3 N1, stage III well-differentiated carcinoid tumor.   INTERVAL HISTORY: Katelyn [name] returns today accompanied by her daughter Katelyn [name] for followup of her  carcinoid tumor as well as her breast cancer. She continues on anastrozole neoadjuvantly for a right breast cancer. She is tolerating this extremely well, and had no new complaints today.  Interval history is remarkable for recent scans showing evidence of cholelithiasis which is slightly symptomatic. Katelyn [name] tells me she has abdominal pain "on and off". This is being followed by Dr. Johna [name]. A mammogram and ultrasound of the right breast in February of this year actually showed some decrease in the size of the right breast tumor, from 1.1 cm to 0.8 cm.   Overall, Katelyn [name] maintains a good functional status, and although she has a sitter daily from 8 AM until 1 PM, she is able to do many things on her own or with little assistance.  REVIEW OF SYSTEMS: Katelyn [name] denies any recent illnesses and has had no fevers, chills, or hot flashes. She is needing and drinking well with no nausea or change in bowel or bladder habits. She's had no skin changes and denies abnormal bleeding. She does bruise easily. She denies any chest pain. She has had no increased shortness of breath and no new cough. No peripheral swelling.  A detailed review of systems is otherwise stable and noncontributory.  PAST MEDICAL HISTORY: Past Medical History  Diagnosis Date  . Hypertension   . Hypothyroidism   . Hx of colonic polyps     RLQ mensenteric mass-1.4cmx2.1cm CT 02/20/10 ? Carcinoid  . Hyperlipidemia   . Depression 08/26/1999    following death of spouse  . Hiatal hernia   . Clostridium difficile colitis   . Anxiety   . Anemia   . Parkinson disease   . Hx of adenomatous colonic polyps 06/25/01  . GERD (gastroesophageal reflux disease)   . Breast cancer 1995    (R) lumpectomy, XT and tomifen- 26-Aug-2003 (L) partial mastectomy with tamoxifen; recurr R 04/2010  . Carcinoid tumor, malignant(M8240/3) 04/2010 SB capsule dx    09/03/10 resection; 2/9 positive LN  . Breast cancer 05/24/10    recurrence  . Atrial fibrillation      peri/post op 08/2010 - resumed NSR    PAST SURGICAL HISTORY: Past Surgical History  Procedure Date  . Abdominal hysterectomy 1969  . Back surgery 1959  . Breast surgery 1995    (R) Lumpectomy  . Mastectomy, partial 26-Aug-2003    left    FAMILY HISTORY Family History  Problem Relation Age of Onset  . Breast cancer Mother   . Hyperlipidemia Mother   . Hypertension Mother   . Colon cancer Mother     ?  . Prostate cancer Father   . Breast cancer Sister     x'2  . Breast cancer Daughter   . Colon polyps Daughter   . Colon polyps Son   . Colon cancer Father   The patient's father died from colon cancer at the age of 88, the patient's mother died at the age of 90 possibly also from colon cancer.  The patient has one brother who is  alive, status post CABG.  She has one sister who died from non cancer related causes although she had a diagnosis of breast cancer in her 55s and a sister who is alive a 79 who also had a diagnosis of breast cancer in her 80s.  GYNECOLOGIC HISTORY:  She is Gx, P4.  She never had hormone replacement therapy.  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  She has been a widow for six years.  She lives by herself.   ADVANCED DIRECTIVES:  HEALTH MAINTENANCE: History  Substance Use Topics  . Smoking status: Never Smoker   . Smokeless tobacco: Never Used  . Alcohol Use: No     Colonoscopy:  PAP:  Bone density:  Lipid panel:  No Known Allergies  Current Outpatient Prescriptions  Medication Sig Dispense Refill  . amLODipine (NORVASC) 5 MG tablet Take 1 tablet (5 mg total) by mouth daily.  90 tablet  1  . anastrozole (ARIMIDEX) 1 MG tablet Take 1 tablet (1 mg total) by mouth daily. Take 1 by mouth daily  90 tablet  2  . atorvastatin (LIPITOR) 20 MG tablet Take 20 mg by mouth daily.      . Calcium-Vitamin D-Iron (CALCIUM 600 IRON/D) [phone] MG-UNIT-MG TABS Take by mouth daily.        Marland Kitchen levothyroxine (SYNTHROID, LEVOTHROID) 50 MCG  tablet TAKE ONE TABLET BY MOUTH DAILY  30 tablet  5  . metoprolol tartrate (LOPRESSOR) 25 MG tablet Take 1 tablet (25 mg total) by mouth 2 (two) times daily.  180 tablet  1  . Multiple Vitamins-Minerals (CENTRUM SILVER) tablet Take 1 tablet by mouth daily.        Marland Kitchen omeprazole (PRILOSEC) 40 MG capsule TAKE 1 CAPSULE BY MOUTH DAILY  90 capsule  1  . DISCONTD: anastrozole (ARIMIDEX) 1 MG tablet Take 1 tablet (1 mg total) by mouth daily. Take 1 by mouth daily  90 tablet  1  . ALPRAZolam (XANAX) 0.25 MG tablet Take 1 tablet (0.25 mg total) by mouth 2 (two) times daily as needed for sleep or anxiety.  40 tablet  1    OBJECTIVE: Elderly white woman who appears comfortable and in no acute distress  Filed Vitals:   12/23/11 1039  BP: 113/67  Pulse: 60  Temp: 97.4 F (36.3 C)  Resp: 20     Body mass index is 23.16 kg/(m^2).    ECOG FS: 1 Filed Weights   12/23/11 1039  Weight: 134 lb 14.4 oz (61.19 kg)   Sclerae unicteric Oropharynx clear No cervical or supraclavicular adenopathy Lungs no rales or rhonchi Heart regular rate and rhythm Abd soft, nontender to palpation, with positive bowel sounds  MSK no focal spinal tenderness, mild kyphosis noted on exam No peripheral edema Neuro: nonfocal, Alert and oriented x3  Breasts: Unable to palpate a distinct mass in the right breast. Left breast is status post lumpectomy with no suspicious nodularities. Axillae are benign bilaterally, no adenopathy.  LAB RESULTS: Lab Results  Component Value Date   WBC 6.1 12/23/2011   NEUTROABS 4.2 12/23/2011   HGB 13.1 12/23/2011   HCT 38.5 12/23/2011   MCV 96.8 12/23/2011   PLT 147 12/23/2011      Chemistry      Component Value Date/Time   NA 142 02/25/2011 0918   K 3.8 02/25/2011 0918   CL 103 02/25/2011 0918   CO2 27 02/25/2011 0918   BUN 15 02/25/2011 0918  CREATININE 1.03 02/25/2011 0918      Component Value Date/Time   CALCIUM 9.7 02/25/2011 0918   ALKPHOS 96 02/25/2011 0918   AST 15  02/25/2011 0918   ALT 12 02/25/2011 0918   BILITOT 0.6 02/25/2011 0918       Lab Results  Component Value Date   LABCA2 19 01/05/2009    STUDIES: US Abdomen Complete  12/12/2011  *RADIOLOGY REPORT*  Clinical Data:  Abdominal pain.  Cholelithiasis.  COMPLETE ABDOMINAL ULTRASOUND  Comparison:  CT scan dated 12/05/2011  Findings:  Gallbladder:  There are several tiny polyps is well as several 5 mm gallstones.  Gallbladder wall is normal.  Negative sonographic Murphy's sign.  Common bile duct:  Normal.  2.8 mm in diameter.  Liver:  Normal.  IVC:  Normal.  Pancreas:  Normal.  Pancreatic duct measures 2.2 mm in diameter.  Spleen:  8 mm cyst.  Otherwise normal.  7.9 cm in length.  Right Kidney:  Normal.  9.2 cm in length.  Left Kidney:  1.1 cm parapelvic cyst.  Otherwise normal.  10.0 cm in length.  Abdominal aorta:  Normal.  1.8 cm in diameter.  IMPRESSION: Several small polyps and small stones in the gallbladder.  No other significant abnormalities.   Original Report Authenticated By: Gwynn Burly, M.D.    Ct Abdomen Pelvis W Contrast  12/04/2011  *RADIOLOGY REPORT*  Clinical Data: History of a small bowel carcinoid with resection in 2012, evaluate for recurrence or obstruction  CT ABDOMEN AND PELVIS WITH CONTRAST  Technique:  Multidetector CT imaging of the abdomen and pelvis was performed following the standard protocol during bolus administration of intravenous contrast.  Contrast:  100 ml Omnipaque-300  BUN and creatinine were obtained on site at [facility] at [address] Results:  BUN 9 mg/dL,  Creatinine 1.0 mg/dL.  Comparison: CT abdomen and pelvis of 02/20/2010  Findings: The lung bases are stable, with a small calcified granuloma posteriorly within the right lower lobe.  A moderate sized hiatal hernia is again noted.  The liver enhances with no focal abnormality and no ductal dilatation is seen. There is a small higher attenuation nodular area within the gallbladder  dependently which may represent a partially calcified small gallstone.  The gallbladder wall is not thickened.  The pancreas is normal in size and the pancreatic duct is not dilated.  The adrenal glands and spleen are unremarkable other than a tiny low attenuation structure near the dome of the spleen of doubtful significance.  The stomach is not well distended.  The kidneys enhance with no calculus or mass and no hydronephrosis is seen. The abdominal aorta is normal in caliber with moderate atheromatous change present.  The mesenteric vasculature is patent.  No adenopathy is seen.  No significant abnormality is seen after resection of the lesion in the right lower quadrant representing carcinoma previously.  A few small lymph nodes are present but no soft tissue mass or definite adenopathy is seen.  Minimal postoperative change with scarring is present.  There is no evidence of small bowel obstruction.  The urinary bladder is not well distended and is slightly thick- walled.  The uterus has previously been resected.  No adnexal lesion is seen.  No fluid is noted within the pelvis.  The anastomoses of small bowel with transverse colon appears patent. Diffuse degenerative disc disease is noted throughout the lumbar spine.  IMPRESSION:  1.  No evidence of recurrence of carcinoid tumor.  No adenopathy.  2.  No evidence of small bowel obstruction. 3.  Moderate sized hiatal hernia. 4.  Question of small gallstone within the gallbladder. 4.  Anastomosis of small bowel with transverse colon appears patent.   Original Report Authenticated By: Juline Patch, M.D.     05/16/2011 *RADIOLOGY REPORT*  Clinical Data: Evaluate response to neoadjuvant chemotherapy,  right breast. Left lumpectomy in 2007.  DIGITAL DIAGNOSTIC BILATERAL MAMMOGRAM WITH CAD AND RIGHT BREAST  ULTRASOUND:  Comparison: Multiple priors most recently 05/23/2010  Findings: There is a fibroglandular pattern. The patient has had  a left lumpectomy. No  new mass or malignant type calcifications  are seen. A ribbon type clip is seen in the upper inner quadrant  of the right breast, posteriorly in the area of previously  described mass. No discrete mass is seen mammographically. Prompt  lymph nodes are again seen in the left axilla, unchanged. Compared  to priors  Mammographic images were processed with CAD.  On physical exam, I palpate thickening in the upper-outer quadrant  of the right breast without discrete mass today. Ultrasound is  performed, showing an irregular, heterogeneously hypoechoic mass  with ill-defined margins in the 1 o'clock position, 6 cm the nipple  measuring 0.8 x 0.8 x 0.7 cm, previously 1.1 x 1.1 x 0.6 cm.  IMPRESSION:  Known malignancy, right breast with interval response to  neoadjuvant chemotherapy. No mammographic evidence of local  recurrence , left breast. Recommend diagnostic mammography in 1  year.  BI-RADS CATEGORY 6: Known biopsy-proven malignancy - appropriate  action should be taken.  Original Report Authenticated By: Hiram Gash, M.D.     ASSESSMENT: 78 y.o.  [employer] woman with a history of   (1) remote right lumpectomy and radiation   (2) left lumpectomy and sentinel lymph node biopsy February of 2007 for a T1c NX Stage I invasive ductal carcinoma, grade 1, estrogen and progesterone receptor positive, HER-2 negative, treated initially with anastrozole with good tolerance, switched to tamoxifen June of 2008 for financial reasons   (3) another right-sided breast cancer biopsied March of 2012, measuring 1.1 cm by ultrasound, grade 3, with a clinically negative axilla, strongly estrogen and progesterone receptor positive, no evidence of HER-2 amplification, and MIB-1 of 21%; no definitive surgery, instead the patient was switched back to anastrozole, on which she continues   (4) small bowel mass status post resection in June of 2012 proving to be a T3 N1, stage III well-differentiated  carcinoid tumor.   PLAN: Kindy will continue on anastrozole which I have refilled today. She is due for her next mammogram in February, and will see Dr. Johna [name] soon thereafter for followup of her breast cancer and carcinoid tumor. In the meanwhile, of course she will continue see Dr. Johna [name] with regards to her recently diagnosed cholelithiasis.  She'll return to see [place] in 1 year for routine followup, but knows to call us with any changes or problems prior to that time. Katelyn [name] and her daughter both voice understanding and agreement with this plan.    Katelyn [name]    12/23/2011

## 2012-01-17 ENCOUNTER — Telehealth (INDEPENDENT_AMBULATORY_CARE_PROVIDER_SITE_OTHER): Payer: Self-pay

## 2012-01-17 NOTE — Telephone Encounter (Signed)
Follow up appointment to discuss surgery scheduled for 01/24/12 @ 9:00 w/Dr. Johna Sheriff.

## 2012-01-24 ENCOUNTER — Ambulatory Visit (INDEPENDENT_AMBULATORY_CARE_PROVIDER_SITE_OTHER): Payer: MEDICARE | Admitting: General Surgery

## 2012-01-24 ENCOUNTER — Encounter (INDEPENDENT_AMBULATORY_CARE_PROVIDER_SITE_OTHER): Payer: Self-pay | Admitting: General Surgery

## 2012-01-24 ENCOUNTER — Encounter (INDEPENDENT_AMBULATORY_CARE_PROVIDER_SITE_OTHER): Payer: MEDICARE | Admitting: General Surgery

## 2012-01-24 VITALS — BP 98/66 | HR 71 | Temp 97.6°F | Resp 18 | Ht 64.0 in | Wt 133.2 lb

## 2012-01-24 DIAGNOSIS — K8020 Calculus of gallbladder without cholecystitis without obstruction: Secondary | ICD-10-CM

## 2012-01-24 NOTE — Progress Notes (Signed)
Chief complaint: Abdominal pain  History: Patient returns to the office for followup of her abdominal pain. She has a history of small bowel resection for carcinoid tumor. She was having similar abdominal pain at that time which seemed to temporarily get better after her bowel resection but then recurred. She describes aching pain in her mid abdomen or little below the umbilicus that wakes her from sleep about 4:00 every morning and last for several hours and then resolves. There is no associated nausea or vomiting. It seems to be a little better if she eats less before she goes to bed. Bowel movements are normal. No fever or chills. We have extensively evaluated her abdomen including CT scan small bowel series that shows no evidence of recurrent carcinoid tumor or adhesions or obstruction. A gallbladder ultrasound has been obtained which does show cholelithiasis.  Past Medical History  Diagnosis Date  . Hypertension   . Hypothyroidism   . Hx of colonic polyps     RLQ mensenteric mass-1.4cmx2.1cm CT 02/20/10 ? Carcinoid  . Hyperlipidemia   . Depression 08-19-1999    following death of spouse  . Hiatal hernia   . Clostridium difficile colitis   . Anxiety   . Anemia   . Parkinson disease   . Hx of adenomatous colonic polyps 06/25/01  . GERD (gastroesophageal reflux disease)   . Breast cancer 1995    (R) lumpectomy, XT and tomifen- Aug 19, 2003 (L) partial mastectomy with tamoxifen; recurr R 04/2010  . Carcinoid tumor, malignant(M8240/3) 04/2010 SB capsule dx    09/03/10 resection; 2/9 positive LN  . Breast cancer 05/24/10    recurrence  . Atrial fibrillation     peri/post op 08/2010 - resumed NSR  . Cholelithiasis 2011-08-19   Past Surgical History  Procedure Date  . Abdominal hysterectomy 1969  . Back surgery 1959  . Breast surgery 1995    (R) Lumpectomy  . Mastectomy, partial 08/19/2003    left   Current Outpatient Prescriptions  Medication Sig Dispense Refill  . ALPRAZolam (XANAX) 0.25 MG tablet Take 1  tablet (0.25 mg total) by mouth 2 (two) times daily as needed for sleep or anxiety.  40 tablet  1  . amLODipine (NORVASC) 5 MG tablet Take 1 tablet (5 mg total) by mouth daily.  90 tablet  1  . anastrozole (ARIMIDEX) 1 MG tablet Take 1 tablet (1 mg total) by mouth daily. Take 1 by mouth daily  90 tablet  2  . atorvastatin (LIPITOR) 20 MG tablet Take 20 mg by mouth daily.      . Calcium-Vitamin D-Iron (CALCIUM 600 IRON/D) [phone] MG-UNIT-MG TABS Take by mouth daily.        Marland Kitchen levothyroxine (SYNTHROID, LEVOTHROID) 50 MCG tablet TAKE ONE TABLET BY MOUTH DAILY  30 tablet  5  . metoprolol tartrate (LOPRESSOR) 25 MG tablet Take 1 tablet (25 mg total) by mouth 2 (two) times daily.  180 tablet  1  . Multiple Vitamins-Minerals (CENTRUM SILVER) tablet Take 1 tablet by mouth daily.        Marland Kitchen omeprazole (PRILOSEC) 40 MG capsule TAKE 1 CAPSULE BY MOUTH DAILY  90 capsule  1   No Known Allergies  Exam: BP 98/66  Pulse 71  Temp 97.6 F (36.4 C) (Temporal)  Resp 18  Ht 5\' 4"  (1.626 m)  Wt 133 lb 3.2 oz (60.419 kg)  BMI 22.86 kg/m2 General: Elderly alert Caucasian female in no distress Skin: Multiple keratoses without rash or infection HEENT: No palpable masses or  thyromegaly. Sclera nonicteric Lymph nodes: No cervical, supraclavicular or axillary nodes palpable Lungs: Clear equal breath sounds bilaterally Breasts: Lumpectomy site upper left breast. No palpable masses in either breast. No adenopathy. Cardiac: Regular rate and rhythm. No edema Abdomen: Mildly protuberant. Well-healed low midline incision. Soft and nontender. No masses organomegaly. No hernias. Extremities no cyanosis or edema Neurologic: She is alert and fully oriented. Gait normal.  Assessment and plan: Persistent abdominal pain with extensive workup and only finding is gallstones. Her pain is not absolutely typical for biliary colic but certainly consistent with this. A long discussion with the patient and her daughters today. I  think cholecystectomy offers a chance to relieve her pain although certainly no guarantee. She and they feel like her pain as a daily significant problem for her and would like to go ahead with surgery in an effort to relieve the pain.I discussed the procedure in detail.  The patient was given Agricultural engineer.  We discussed the risks and benefits of a laparoscopic cholecystectomy and possible cholangiogram including, but not limited to bleeding, infection, injury to surrounding structures such as the intestine or liver, bile leak, retained gallstones, need to convert to an open procedure, prolonged diarrhea, blood clots such as  DVT, common bile duct injury, anesthesia risks, and possible need for additional procedures.  The likelihood of improvement in symptoms and return to the patient's normal status is good. We discussed the typical post-operative recovery course. We will schedule this overnight hospitalization at their convenience.

## 2012-02-05 ENCOUNTER — Other Ambulatory Visit: Payer: Self-pay | Admitting: Internal Medicine

## 2012-02-07 ENCOUNTER — Other Ambulatory Visit: Payer: Self-pay | Admitting: *Deleted

## 2012-02-07 ENCOUNTER — Encounter (INDEPENDENT_AMBULATORY_CARE_PROVIDER_SITE_OTHER): Payer: MEDICARE | Admitting: General Surgery

## 2012-02-07 MED ORDER — ATORVASTATIN CALCIUM 20 MG PO TABS: 20.0000 mg | ORAL_TABLET | Freq: Every day | ORAL | Status: DC

## 2012-02-07 NOTE — Telephone Encounter (Signed)
R'cd fax from [facility] for refill of Lipitor.

## 2012-02-12 ENCOUNTER — Other Ambulatory Visit: Payer: Self-pay | Admitting: Oncology

## 2012-03-12 ENCOUNTER — Encounter (HOSPITAL_COMMUNITY): Payer: Self-pay | Admitting: Pharmacy Technician

## 2012-03-16 NOTE — Patient Instructions (Addendum)
20 Katelyn [name]  03/16/2012   Your procedure is scheduled on:  03/25/12  [facility]  Report to [facility] at     0630  AM.  Call this number if you have problems the morning of surgery: [phone]       Remember:   Do not eat food  Or drink :After Midnight. Tuesday NIGHT   Take these medicines the morning of surgery with A SIP OF WATER:   Levothyroxine, Prolisec, Norvasc, Metoprolol, Arimidex   .  Contacts, dentures or partial plates can not be worn to surgery  Leave suitcase in the car. After surgery it may be brought to your room.  For patients admitted to the hospital, checkout time is 11:00 AM day of  discharge.             SPECIAL INSTRUCTIONS- SEE [place] PREPARING FOR SURGERY INSTRUCTION SHEET-     DO NOT WEAR JEWELRY, LOTIONS, POWDERS, OR PERFUMES.  WOMEN-- DO NOT SHAVE LEGS OR UNDERARMS FOR 12 HOURS BEFORE SHOWERS. MEN MAY SHAVE FACE.  Patients discharged the day of surgery will not be allowed to drive home. IF going home the day of surgery, you must have a driver and someone to stay with you for the first 24 hours  Name and phone number of your driver:       Overnight stay/   Daughter Katelyn [name]      [phone]                                                           Please read over the following fact sheets that you were given: MRSA Information, Incentive Spirometry Sheet, Blood Transfusion Sheet  Information                                                                                   Katelyn [name]  PST 336  [phone]                 FAILURE TO FOLLOW THESE INSTRUCTIONS MAY RESULT IN  CANCELLATION   OF YOUR SURGERY                                                  Patient Signature _____________________________

## 2012-03-17 ENCOUNTER — Ambulatory Visit (HOSPITAL_COMMUNITY)
Admission: RE | Admit: 2012-03-17 | Discharge: 2012-03-17 | Disposition: A | Discharge status: Home / Self Care | Payer: MEDICARE | Source: Ambulatory Visit | Attending: General Surgery | Admitting: General Surgery

## 2012-03-17 ENCOUNTER — Encounter (HOSPITAL_COMMUNITY)
Admission: RE | Admit: 2012-03-17 | Discharge: 2012-03-17 | Disposition: A | Discharge status: Home / Self Care | Payer: MEDICARE | Source: Ambulatory Visit | Attending: General Surgery | Admitting: General Surgery

## 2012-03-17 ENCOUNTER — Encounter (HOSPITAL_COMMUNITY): Payer: Self-pay

## 2012-03-17 DIAGNOSIS — Z01818 Encounter for other preprocedural examination: Secondary | ICD-10-CM | POA: Insufficient documentation

## 2012-03-17 HISTORY — DX: Unspecified hearing loss, unspecified ear: H91.90

## 2012-03-17 HISTORY — DX: Personal history of other medical treatment: Z92.89

## 2012-03-17 HISTORY — DX: Malignant (primary) neoplasm, unspecified: C80.1

## 2012-03-17 LAB — CBC
HCT: 40 % (ref 36.0–46.0)
MCH: 31.8 pg (ref 26.0–34.0)
MCV: 93.5 fL (ref 78.0–100.0)
RBC: 4.28 MIL/uL (ref 3.87–5.11)
WBC: 7.6 10*3/uL (ref 4.0–10.5)

## 2012-03-17 LAB — BASIC METABOLIC PANEL
BUN: 16 mg/dL (ref 6–23)
CO2: 29 mEq/L (ref 19–32)
Calcium: 10.1 mg/dL (ref 8.4–10.5)
Chloride: 100 mEq/L (ref 96–112)
Creatinine, Ser: 0.83 mg/dL (ref 0.50–1.10)
Glucose, Bld: 75 mg/dL (ref 70–99)

## 2012-03-17 NOTE — Progress Notes (Signed)
PST VISIT-     Right lower eyelid- internal and externally-  extremely reddened with old whitish crust drainage noted. Right eye is somewhat red. Daughter states she took patient to eye doctor Monday and was told it is an infection and is to start antibiotic eye drops today.  Instructed daughter if this has not cleared up by Monday or becomes worde to follow up and notify surgeon as well. Verbalized understanding

## 2012-03-25 ENCOUNTER — Encounter (HOSPITAL_COMMUNITY)
Admission: RE | Disposition: A | Discharge status: Home Health | Payer: Self-pay | Source: Ambulatory Visit | Attending: General Surgery

## 2012-03-25 ENCOUNTER — Ambulatory Visit (HOSPITAL_COMMUNITY): Payer: MEDICARE

## 2012-03-25 ENCOUNTER — Encounter (HOSPITAL_COMMUNITY): Payer: Self-pay | Admitting: Anesthesiology

## 2012-03-25 ENCOUNTER — Ambulatory Visit (HOSPITAL_COMMUNITY): Payer: MEDICARE | Admitting: Anesthesiology

## 2012-03-25 ENCOUNTER — Encounter (HOSPITAL_COMMUNITY): Payer: Self-pay | Admitting: *Deleted

## 2012-03-25 ENCOUNTER — Ambulatory Visit (HOSPITAL_COMMUNITY)
Admission: RE | Admit: 2012-03-25 | Discharge: 2012-03-26 | Disposition: A | Discharge status: Home Health | Payer: MEDICARE | Source: Ambulatory Visit | Attending: General Surgery | Admitting: General Surgery

## 2012-03-25 DIAGNOSIS — K811 Chronic cholecystitis: Secondary | ICD-10-CM

## 2012-03-25 DIAGNOSIS — K8010 Calculus of gallbladder with chronic cholecystitis without obstruction: Secondary | ICD-10-CM

## 2012-03-25 DIAGNOSIS — E039 Hypothyroidism, unspecified: Secondary | ICD-10-CM | POA: Insufficient documentation

## 2012-03-25 DIAGNOSIS — E785 Hyperlipidemia, unspecified: Secondary | ICD-10-CM | POA: Insufficient documentation

## 2012-03-25 DIAGNOSIS — I10 Essential (primary) hypertension: Secondary | ICD-10-CM | POA: Insufficient documentation

## 2012-03-25 DIAGNOSIS — K219 Gastro-esophageal reflux disease without esophagitis: Secondary | ICD-10-CM | POA: Insufficient documentation

## 2012-03-25 DIAGNOSIS — Z79899 Other long term (current) drug therapy: Secondary | ICD-10-CM | POA: Insufficient documentation

## 2012-03-25 HISTORY — PX: CHOLECYSTECTOMY: SHX55

## 2012-03-25 SURGERY — LAPAROSCOPIC CHOLECYSTECTOMY WITH INTRAOPERATIVE CHOLANGIOGRAM
Anesthesia: General | Site: Abdomen | Wound class: Clean Contaminated

## 2012-03-25 MED ORDER — NEOSTIGMINE METHYLSULFATE 1 MG/ML IJ SOLN
INTRAMUSCULAR | Status: DC | PRN
  Administered 2012-03-25: 3 mg via INTRAVENOUS

## 2012-03-25 MED ORDER — METOPROLOL TARTRATE 25 MG PO TABS
25.0000 mg | ORAL_TABLET | Freq: Every day | ORAL | Status: DC
  Administered 2012-03-26: 25 mg via ORAL
  Filled 2012-03-25: qty 1

## 2012-03-25 MED ORDER — MORPHINE SULFATE 2 MG/ML IJ SOLN: 2.0000 mg | INTRAMUSCULAR | Status: DC | PRN

## 2012-03-25 MED ORDER — ACETAMINOPHEN 10 MG/ML IV SOLN
INTRAVENOUS | Status: DC | PRN
  Administered 2012-03-25: 1000 mg via INTRAVENOUS

## 2012-03-25 MED ORDER — LIDOCAINE HCL (CARDIAC) 20 MG/ML IV SOLN
INTRAVENOUS | Status: DC | PRN
  Administered 2012-03-25: 50 mg via INTRAVENOUS

## 2012-03-25 MED ORDER — BUPIVACAINE-EPINEPHRINE 0.25% -1:200000 IJ SOLN
INTRAMUSCULAR | Status: DC | PRN
  Administered 2012-03-25: 20 mL

## 2012-03-25 MED ORDER — LEVOTHYROXINE SODIUM 50 MCG PO TABS
50.0000 ug | ORAL_TABLET | Freq: Every day | ORAL | Status: DC
  Administered 2012-03-26: 50 ug via ORAL
  Filled 2012-03-25 (×2): qty 1

## 2012-03-25 MED ORDER — ONDANSETRON HCL 4 MG/2ML IJ SOLN: 4.0000 mg | Freq: Four times a day (QID) | INTRAMUSCULAR | Status: DC | PRN

## 2012-03-25 MED ORDER — DEXTROSE IN LACTATED RINGERS 5 % IV SOLN
INTRAVENOUS | Status: DC
  Administered 2012-03-25: 50 mL/h via INTRAVENOUS
  Administered 2012-03-26: 06:00:00 via INTRAVENOUS

## 2012-03-25 MED ORDER — ONDANSETRON HCL 4 MG PO TABS: 4.0000 mg | ORAL_TABLET | Freq: Four times a day (QID) | ORAL | Status: DC | PRN

## 2012-03-25 MED ORDER — FENTANYL CITRATE 0.05 MG/ML IJ SOLN: 25.0000 ug | INTRAMUSCULAR | Status: DC | PRN

## 2012-03-25 MED ORDER — MENTHOL 3 MG MT LOZG
1.0000 | LOZENGE | OROMUCOSAL | Status: DC | PRN
  Administered 2012-03-25 – 2012-03-26 (×2): 3 mg via ORAL
  Filled 2012-03-25: qty 9

## 2012-03-25 MED ORDER — SUCCINYLCHOLINE CHLORIDE 20 MG/ML IJ SOLN
INTRAMUSCULAR | Status: DC | PRN
  Administered 2012-03-25: 100 mg via INTRAVENOUS

## 2012-03-25 MED ORDER — ONDANSETRON HCL 4 MG/2ML IJ SOLN
INTRAMUSCULAR | Status: DC | PRN
  Administered 2012-03-25: 4 mg via INTRAVENOUS

## 2012-03-25 MED ORDER — AMLODIPINE BESYLATE 5 MG PO TABS
5.0000 mg | ORAL_TABLET | Freq: Every day | ORAL | Status: DC
  Administered 2012-03-26: 5 mg via ORAL
  Filled 2012-03-25: qty 1

## 2012-03-25 MED ORDER — PROMETHAZINE HCL 25 MG/ML IJ SOLN: 6.2500 mg | INTRAMUSCULAR | Status: DC | PRN

## 2012-03-25 MED ORDER — ANASTROZOLE 1 MG PO TABS
1.0000 mg | ORAL_TABLET | Freq: Every day | ORAL | Status: DC
  Administered 2012-03-26: 1 mg via ORAL
  Filled 2012-03-25: qty 1

## 2012-03-25 MED ORDER — LACTATED RINGERS IV SOLN
INTRAVENOUS | Status: DC | PRN
  Administered 2012-03-25: 08:00:00 via INTRAVENOUS

## 2012-03-25 MED ORDER — IOHEXOL 300 MG/ML  SOLN
INTRAMUSCULAR | Status: DC | PRN
  Administered 2012-03-25: 7 mL via INTRAVENOUS

## 2012-03-25 MED ORDER — CEFAZOLIN SODIUM-DEXTROSE 2-3 GM-% IV SOLR
2.0000 g | INTRAVENOUS | Status: AC
  Administered 2012-03-25: 2 g via INTRAVENOUS

## 2012-03-25 MED ORDER — FENTANYL CITRATE 0.05 MG/ML IJ SOLN
INTRAMUSCULAR | Status: DC | PRN
  Administered 2012-03-25: 100 ug via INTRAVENOUS
  Administered 2012-03-25 (×3): 50 ug via INTRAVENOUS

## 2012-03-25 MED ORDER — PROPOFOL 10 MG/ML IV BOLUS
INTRAVENOUS | Status: DC | PRN
  Administered 2012-03-25: 100 mg via INTRAVENOUS

## 2012-03-25 MED ORDER — LACTATED RINGERS IR SOLN
Status: DC | PRN
  Administered 2012-03-25: 1

## 2012-03-25 MED ORDER — ROCURONIUM BROMIDE 100 MG/10ML IV SOLN
INTRAVENOUS | Status: DC | PRN
  Administered 2012-03-25: 20 mg via INTRAVENOUS

## 2012-03-25 MED ORDER — GLYCOPYRROLATE 0.2 MG/ML IJ SOLN
INTRAMUSCULAR | Status: DC | PRN
  Administered 2012-03-25: 0.4 mg via INTRAVENOUS

## 2012-03-25 MED ORDER — LACTATED RINGERS IV SOLN: INTRAVENOUS | Status: DC

## 2012-03-25 MED ORDER — HYDROCODONE-ACETAMINOPHEN 5-325 MG PO TABS: 1.0000 | ORAL_TABLET | ORAL | Status: DC | PRN

## 2012-03-25 MED ORDER — ATORVASTATIN CALCIUM 20 MG PO TABS
20.0000 mg | ORAL_TABLET | Freq: Every day | ORAL | Status: DC
  Administered 2012-03-25: 20 mg via ORAL
  Filled 2012-03-25 (×2): qty 1

## 2012-03-25 MED ORDER — HEPARIN SODIUM (PORCINE) 5000 UNIT/ML IJ SOLN
5000.0000 [IU] | Freq: Three times a day (TID) | INTRAMUSCULAR | Status: DC
  Administered 2012-03-25 – 2012-03-26 (×2): 5000 [IU] via SUBCUTANEOUS
  Filled 2012-03-25 (×6): qty 1

## 2012-03-25 SURGICAL SUPPLY — 45 items
ADH SKN CLS APL DERMABOND .7 (GAUZE/BANDAGES/DRESSINGS) ×2
APL SKNCLS STERI-STRIP NONHPOA (GAUZE/BANDAGES/DRESSINGS)
APPLIER CLIP ROT 10 11.4 M/L (STAPLE) ×2
APR CLP MED LRG 11.4X10 (STAPLE) ×1
BAG SPEC RTRVL LRG 6X4 10 (ENDOMECHANICALS) ×1
BENZOIN TINCTURE PRP APPL 2/3 (GAUZE/BANDAGES/DRESSINGS) IMPLANT
CANISTER SUCTION 2500CC (MISCELLANEOUS) ×2 IMPLANT
CATH REDDICK CHOLANGI 4FR 50CM (CATHETERS) ×1 IMPLANT
CHLORAPREP W/TINT 26ML (MISCELLANEOUS) ×2 IMPLANT
CLIP APPLIE ROT 10 11.4 M/L (STAPLE) ×1 IMPLANT
CLOTH BEACON ORANGE TIMEOUT ST (SAFETY) ×2 IMPLANT
COVER MAYO STAND STRL (DRAPES) ×2 IMPLANT
DECANTER SPIKE VIAL GLASS SM (MISCELLANEOUS) ×2 IMPLANT
DERMABOND ADVANCED (GAUZE/BANDAGES/DRESSINGS) ×2
DERMABOND ADVANCED .7 DNX12 (GAUZE/BANDAGES/DRESSINGS) ×2 IMPLANT
DRAPE C-ARM 42X72 X-RAY (DRAPES) ×2 IMPLANT
DRAPE LAPAROSCOPIC ABDOMINAL (DRAPES) ×2 IMPLANT
ELECT REM PT RETURN 9FT ADLT (ELECTROSURGICAL) ×2
ELECTRODE REM PT RTRN 9FT ADLT (ELECTROSURGICAL) ×1 IMPLANT
GLOVE BIOGEL PI IND STRL 7.0 (GLOVE) ×1 IMPLANT
GLOVE BIOGEL PI INDICATOR 7.0 (GLOVE) ×1
GLOVE SS BIOGEL STRL SZ 7.5 (GLOVE) ×1 IMPLANT
GLOVE SUPERSENSE BIOGEL SZ 7.5 (GLOVE) ×1
GOWN STRL NON-REIN LRG LVL3 (GOWN DISPOSABLE) ×2 IMPLANT
GOWN STRL REIN XL XLG (GOWN DISPOSABLE) ×4 IMPLANT
HEMOSTAT SURGICEL 4X8 (HEMOSTASIS) IMPLANT
IV CATH 14GX2 1/4 (CATHETERS) IMPLANT
IV SET EXT 30 76VOL 4 MALE LL (IV SETS) IMPLANT
KIT BASIN OR (CUSTOM PROCEDURE TRAY) ×2 IMPLANT
NS IRRIG 1000ML POUR BTL (IV SOLUTION) ×1 IMPLANT
POUCH SPECIMEN RETRIEVAL 10MM (ENDOMECHANICALS) ×1 IMPLANT
SET CHOLANGIOGRAPH MIX (MISCELLANEOUS) ×2 IMPLANT
SET IRRIG TUBING LAPAROSCOPIC (IRRIGATION / IRRIGATOR) ×2 IMPLANT
SOLUTION ANTI FOG 6CC (MISCELLANEOUS) ×2 IMPLANT
STOPCOCK K 69 2C6206 (IV SETS) IMPLANT
STRIP CLOSURE SKIN 1/2X4 (GAUZE/BANDAGES/DRESSINGS) IMPLANT
SUT MNCRL AB 4-0 PS2 18 (SUTURE) ×2 IMPLANT
TOWEL OR 17X26 10 PK STRL BLUE (TOWEL DISPOSABLE) ×2 IMPLANT
TRAY LAP CHOLE (CUSTOM PROCEDURE TRAY) ×2 IMPLANT
TROCAR SLEEVE XCEL 5X75 (ENDOMECHANICALS) ×2 IMPLANT
TROCAR XCEL BLADELESS 5X75MML (TROCAR) ×2 IMPLANT
TROCAR XCEL BLUNT TIP 100MML (ENDOMECHANICALS) ×2 IMPLANT
TROCAR XCEL NON-BLD 11X100MML (ENDOMECHANICALS) ×2 IMPLANT
TROCAR Z-THREAD FIOS 5X100MM (TROCAR) ×2 IMPLANT
TUBING INSUFFLATION 10FT LAP (TUBING) ×2 IMPLANT

## 2012-03-25 NOTE — Anesthesia Preprocedure Evaluation (Signed)
Anesthesia Evaluation  Patient identified by MRN, date of birth, ID band Patient awake    Reviewed: Allergy & Precautions, H&P , NPO status , Patient's Chart, lab work & pertinent test results  Airway Mallampati: III TM Distance: >3 FB Neck ROM: Limited    Dental  (+) Teeth Intact and Dental Advisory Given   Pulmonary neg pulmonary ROS,  breath sounds clear to auscultation        Cardiovascular hypertension, Pt. on medications and Pt. on home beta blockers negative cardio ROS  + dysrhythmias Atrial Fibrillation Rhythm:Regular Rate:Normal     Neuro/Psych Anxiety Depression Parkinsons  Neuromuscular disease negative neurological ROS  negative psych ROS   GI/Hepatic negative GI ROS, Neg liver ROS, hiatal hernia, GERD-  ,  Endo/Other  negative endocrine ROSHypothyroidism   Renal/GU negative Renal ROS  negative genitourinary   Musculoskeletal negative musculoskeletal ROS (+)   Abdominal   Peds negative pediatric ROS (+)  Hematology negative hematology ROS (+)   Anesthesia Other Findings   Reproductive/Obstetrics negative OB ROS                           Anesthesia Physical Anesthesia Plan  ASA: III  Anesthesia Plan: General   Post-op Pain Management:    Induction: Intravenous  Airway Management Planned: Oral ETT  Additional Equipment:   Intra-op Plan:   Post-operative Plan: Extubation in OR  Informed Consent: I have reviewed the patients History and Physical, chart, labs and discussed the procedure including the risks, benefits and alternatives for the proposed anesthesia with the patient or authorized representative who has indicated his/her understanding and acceptance.   Dental advisory given  Plan Discussed with: CRNA  Anesthesia Plan Comments:         Anesthesia Quick Evaluation

## 2012-03-25 NOTE — Op Note (Signed)
Preoperative diagnosis: Cholelithiasis and cholecystitis  Postoperative diagnosis: Cholelithiasis and cholecystitis  Surgical procedure: Laparoscopic cholecystectomy with intraoperative cholangiogram  Surgeon: Sharlet Salina [name]. Khara Renaud M.D.  Assistant: Harriette Bouillon  Anesthesia: General Endotracheal  Complications: None  Estimated blood loss: Minimal  Description of procedure: The patient brought to the operating room, placed in the supine position on the operating table, and general endotracheal anesthesia induced. The abdomen was widely sterilely prepped and draped. The patient had received preoperative IV antibiotics and PAS were in place. Patient timeout was performed the correct procedure verified. Standard 4 port technique was used with an open Hassan cannula at the umbilicus and the remainder of the ports placed under direct vision. The gallbladder was visualized. It appeared grossly unremarkable. The fundus was grasped and elevated up over the liver and the infundibulum retracted inferiolaterally. Peritoneum anterior and posterior to close triangle was incised and fibrofatty tissue stripped off the neck of the gallbladder toward the porta hepatis. The distal gallbladder was thoroughly dissected. The cystic artery was identified in close triangle and the cystic duct gallbladder junction dissected 360.  A good critical view was obtained. When the anatomy was clear the cystic duct was clipped at the gallbladder junction and an operative cholangiogram obtained through the cystic duct. This showed good filling of a normal common bile duct and intrahepatic ducts with free flow into the duodenum and no filling defects. Following this the Cholangiocath was removed and the cystic duct was doubly clipped proximally and divided. The cystic artery was doubly clipped proximally and distally and divided. The gallbladder was dissected free from its bed using hook cautery and removed through the umbilical port  site. Complete hemostasis was obtained in the gallbladder bed. The right upper quadrant was thoroughly irrigated and hemostasis assured. Trochars were removed and all CO2 evacuated and the [facility] trocar site fascial defect closed. Skin incisions were closed with subcuticular Monocryl and Dermabond. Sponge needle and instrument counts were correct. The patient was taken to PACU in good condition.  Katelyn [name]  03/25/2012

## 2012-03-25 NOTE — Care Management Note (Unsigned)
    Page 1 of 1   03/25/2012     1:18:28 PM   CARE MANAGEMENT NOTE 03/25/2012  Patient:  INETTA, DICKE   Account Number:  [id number]  Date Initiated:  03/25/2012  Documentation initiated by:  Lanier Clam  Subjective/Objective Assessment:   ADMITTED W/ABD PAIN.HX:SB RESECTION.     Action/Plan:   FROM HOME.HAS PRIVATE SITTERS.HAS PCP,PHARMACY.   Anticipated DC Date:  03/26/2012   Anticipated DC Plan:  HOME/SELF CARE      DC Planning Services  CM consult      Choice offered to / List presented to:             Status of service:  In process, will continue to follow Medicare Important Message given?   (If response is "NO", the following Medicare IM given date fields will be blank) Date Medicare IM given:   Date Additional Medicare IM given:    Discharge Disposition:    Per UR Regulation:  Reviewed for med. necessity/level of care/duration of stay  If discussed at Long Length of Stay Meetings, dates discussed:    Comments:  03/25/12 Amarii Bordas RN,BSN NCM [phone] S/P LAP CHOLE.

## 2012-03-25 NOTE — H&P (Signed)
Chief complaint: Abdominal pain  History: Patient is admitted for elective laparoscopic cholecystectomy. She has a history of small bowel resection for carcinoid tumor. She was having similar abdominal pain at that time which seemed to temporarily get better after her bowel resection but then recurred. She describes aching pain in her mid abdomen or little below the umbilicus that wakes her from sleep about 4:00 every morning and last for several hours and then resolves. There is no associated nausea or vomiting. It seems to be a little better if she eats less before she goes to bed. Bowel movements are normal. No fever or chills. We have extensively evaluated her abdomen including CT scan small bowel series that shows no evidence of recurrent carcinoid tumor or adhesions or obstruction. A gallbladder ultrasound has been obtained which does show cholelithiasis.  Past Medical History   Diagnosis  Date   .  Hypertension    .  Hypothyroidism    .  Hx of colonic polyps      RLQ mensenteric mass-1.4cmx2.1cm CT 02/20/10 ? Carcinoid   .  Hyperlipidemia    .  Depression  08/07/1999     following death of spouse   .  Hiatal hernia    .  Clostridium difficile colitis    .  Anxiety    .  Anemia    .  Parkinson disease    .  Hx of adenomatous colonic polyps  06/25/01   .  GERD (gastroesophageal reflux disease)    .  Breast cancer  1995     (R) lumpectomy, XT and tomifen- Aug 07, 2003 (L) partial mastectomy with tamoxifen; recurr R 04/2010   .  Carcinoid tumor, malignant(M8240/3)  04/2010 SB capsule dx     09/03/10 resection; 2/9 positive LN   .  Breast cancer  05/24/10     recurrence   .  Atrial fibrillation      peri/post op 08/2010 - resumed NSR   .  Cholelithiasis  08-07-2011    Past Surgical History   Procedure  Date   .  Abdominal hysterectomy  1969   .  Back surgery  1959   .  Breast surgery  1995     (R) Lumpectomy   .  Mastectomy, partial  2003/08/07     left    Current Outpatient Prescriptions   Medication  Sig   Dispense  Refill   .  ALPRAZolam (XANAX) 0.25 MG tablet  Take 1 tablet (0.25 mg total) by mouth 2 (two) times daily as needed for sleep or anxiety.  40 tablet  1   .  amLODipine (NORVASC) 5 MG tablet  Take 1 tablet (5 mg total) by mouth daily.  90 tablet  1   .  anastrozole (ARIMIDEX) 1 MG tablet  Take 1 tablet (1 mg total) by mouth daily. Take 1 by mouth daily  90 tablet  2   .  atorvastatin (LIPITOR) 20 MG tablet  Take 20 mg by mouth daily.     .  Calcium-Vitamin D-Iron (CALCIUM 600 IRON/D) 161-096-04 MG-UNIT-MG TABS  Take by mouth daily.     Marland Kitchen  levothyroxine (SYNTHROID, LEVOTHROID) 50 MCG tablet  TAKE ONE TABLET BY MOUTH DAILY  30 tablet  5   .  metoprolol tartrate (LOPRESSOR) 25 MG tablet  Take 1 tablet (25 mg total) by mouth 2 (two) times daily.  180 tablet  1   .  Multiple Vitamins-Minerals (CENTRUM SILVER) tablet  Take 1 tablet by mouth daily.     Marland Kitchen  omeprazole (PRILOSEC) 40 MG capsule  TAKE 1 CAPSULE BY MOUTH DAILY  90 capsule  1    No Known Allergies  Exam:  BP 148/69  Pulse 65  Temp 97.3 F (36.3 C) (Oral)  Resp 16  SpO2 100%  General: Elderly alert Caucasian female in no distress  Skin: Multiple keratoses without rash or infection  HEENT: No palpable masses or thyromegaly. Sclera nonicteric  Lymph nodes: No cervical, supraclavicular or axillary nodes palpable  Lungs: Clear equal breath sounds bilaterally  Breasts: Lumpectomy site upper left breast. No palpable masses in either breast. No adenopathy.  Cardiac: Regular rate and rhythm. No edema  Abdomen: Mildly protuberant. Well-healed low midline incision. Soft and nontender. No masses organomegaly. No hernias.  Extremities no cyanosis or edema  Neurologic: She is alert and fully oriented. Gait normal.   Assessment and plan: Persistent abdominal pain with extensive workup and only finding is gallstones. Her pain is not absolutely typical for biliary colic but certainly consistent with this.I think cholecystectomy offers a  chance to relieve her pain although certainly no guarantee. She and they feel like her pain as a daily significant problem for her and would like to go ahead with surgery in an effort to relieve the pain.I discussed the procedure in detail. . We have discussed the risks and benefits of a laparoscopic cholecystectomy and possible cholangiogram including, but not limited to bleeding, infection, injury to surrounding structures such as the intestine or liver, bile leak, retained gallstones, need to convert to an open procedure, prolonged diarrhea, blood clots such as DVT, common bile duct injury, anesthesia risks, and possible need for additional procedures. The likelihood of improvement in symptoms and return to the patient's normal status is good. We discussed the typical post-operative recovery course.

## 2012-03-25 NOTE — Anesthesia Postprocedure Evaluation (Signed)
Anesthesia Post Note  Patient: [name]  Procedure(s) Performed: Procedure(s) (LRB): LAPAROSCOPIC CHOLECYSTECTOMY WITH INTRAOPERATIVE CHOLANGIOGRAM (N/A)  Anesthesia type: General  Patient location: PACU  Post pain: Pain level controlled  Post assessment: Post-op Vital signs reviewed  Last Vitals:  Filed Vitals:   03/25/12 1030  BP: 150/53  Pulse: 77  Temp:   Resp: 15    Post vital signs: Reviewed  Level of consciousness: sedated  Complications: No apparent anesthesia complications

## 2012-03-25 NOTE — Transfer of Care (Signed)
Immediate Anesthesia Transfer of Care Note  Patient: Katelyn [name]  Procedure(s) Performed: Procedure(s) (LRB) with comments: LAPAROSCOPIC CHOLECYSTECTOMY WITH INTRAOPERATIVE CHOLANGIOGRAM (N/A)  Patient Location: PACU  Anesthesia Type:General  Level of Consciousness: alert  and confused  Airway & Oxygen Therapy: Patient Spontanous Breathing and Patient connected to face mask oxygen  Post-op Assessment: Report given to PACU RN, Post -op Vital signs reviewed and stable and trying to sit up in bed.   Encouraged to relax.  Post vital signs: Reviewed and stable  Complications: No apparent anesthesia complications

## 2012-03-25 NOTE — Progress Notes (Signed)
I AGREE WITH THE PREVIOUS NURSE ASSESSMENT. 

## 2012-03-25 NOTE — Preoperative (Signed)
Beta Blockers   Reason not to administer Beta Blockers:Took Metoprolol this am. 

## 2012-03-26 ENCOUNTER — Encounter (HOSPITAL_COMMUNITY): Payer: Self-pay | Admitting: General Surgery

## 2012-03-26 MED ORDER — HYDROCODONE-ACETAMINOPHEN 5-325 MG PO TABS: 1.0000 | ORAL_TABLET | ORAL | Status: DC | PRN

## 2012-03-26 NOTE — Progress Notes (Signed)
Discharge instructions explained and prescription given to daughter who verbalizes understanding of same. Stable on discharge.

## 2012-03-26 NOTE — Discharge Summary (Signed)
   Patient ID: Katelyn [name] [id number] 79 y.o. June 17, 1918  03/25/2012  Discharge date and time: 03/26/2012   Admitting Physician: Glenna Fellows T  Discharge Physician: Glenna Fellows T  Admission Diagnoses: cholelithiasis  Discharge Diagnoses: Same  Operations: Procedure(s): LAPAROSCOPIC CHOLECYSTECTOMY WITH INTRAOPERATIVE CHOLANGIOGRAM  Admission Condition: good  Discharged Condition: good  Indication for Admission: patient is a 79 year old female with persistent episodes of mid abdominal pain that occur at night. This has become a very significant problem for her and she and her family feel it is not a tolerable situation. She has had an extensive workup with a previous history of carcinoid tumor of the small bowel resected and the only abnormal finding is cholelithiasis. Her symptoms seem consistent with biliary tract disease after extensive discussion regarding indications and risks detailed elsewhere she is admitted for elective laparoscopic cholecystectomy  Hospital Course: patient was admitted on the morning of her procedure and underwent an uneventful laparoscopic cholecystectomy. She tolerated the procedure well and on the first postoperative day he has minimal discomfort. She is tolerating her diet. Abdomen is soft and nontender. She was followed on telemetry with no significant arrhythmias. Vital signs are all within normal limits. She is felt ready for discharge.   Disposition: Home  Patient Instructions:   [name], [name]  Home Medication Instructions JYN:[id number]   Printed on:03/26/12 0841  Medication Information                    Calcium-Vitamin D-Iron (CALCIUM 600 IRON/D) 600-125-18 MG-UNIT-MG TABS Take 1 capsule by mouth daily.            Multiple Vitamins-Minerals (CENTRUM SILVER) tablet Take 1 tablet by mouth daily.            amLODipine (NORVASC) 5 MG tablet Take 5 mg by mouth daily after breakfast.            anastrozole (ARIMIDEX) 1 MG  tablet Take 1 mg by mouth daily after breakfast.            atorvastatin (LIPITOR) 20 MG tablet Take 20 mg by mouth at bedtime.           levothyroxine (SYNTHROID, LEVOTHROID) 50 MCG tablet Take 50 mcg by mouth every morning.           omeprazole (PRILOSEC) 40 MG capsule Take 40 mg by mouth every morning.           metoprolol tartrate (LOPRESSOR) 25 MG tablet Take 25 mg by mouth daily after breakfast.            HYDROcodone-acetaminophen (NORCO/VICODIN) 5-325 MG per tablet Take 1-2 tablets by mouth every 4 (four) hours as needed.             Activity: activity as tolerated Diet: regular diet Wound Care: none needed  Follow-up:  With Dr. Johna Sheriff in 3 weeks.  Signed: Mariella Saa MD, FACS  03/26/2012, 8:41 AM

## 2012-04-05 ENCOUNTER — Other Ambulatory Visit: Payer: Self-pay | Admitting: Internal Medicine

## 2012-04-10 ENCOUNTER — Telehealth (INDEPENDENT_AMBULATORY_CARE_PROVIDER_SITE_OTHER): Payer: Self-pay

## 2012-04-10 NOTE — Telephone Encounter (Signed)
Called patient daughter Elease Hashimoto) to give patient post op appt scheduled for Friday 04/17/12 @ 4:30 pm.  No answer

## 2012-04-13 ENCOUNTER — Other Ambulatory Visit: Payer: Self-pay | Admitting: Internal Medicine

## 2012-04-17 ENCOUNTER — Encounter (INDEPENDENT_AMBULATORY_CARE_PROVIDER_SITE_OTHER): Payer: Self-pay | Admitting: General Surgery

## 2012-04-17 ENCOUNTER — Ambulatory Visit (INDEPENDENT_AMBULATORY_CARE_PROVIDER_SITE_OTHER): Payer: MEDICARE | Admitting: General Surgery

## 2012-04-17 VITALS — BP 122/68 | HR 60 | Temp 97.3°F | Resp 16 | Ht 62.0 in | Wt 135.8 lb

## 2012-04-17 DIAGNOSIS — Z09 Encounter for follow-up examination after completed treatment for conditions other than malignant neoplasm: Secondary | ICD-10-CM

## 2012-04-17 NOTE — Progress Notes (Signed)
History: Patient returns now 3 weeks following laparoscopic cholecystectomy with history of persistent nocturnal abdominal pain and history of small bowel resection for carcinoid tumor. This pain has been persistent since before her carcinoid was diagnosed and was not alleviated by her small bowel resection. Extensive workup was negative except for cholelithiasis. At this point she is continuing to have nighttime abdominal pain that awakens her about 4:30 in the morning. It's not every night and it is not severe. She had no particular problems related to her surgery.  Exam: BP 122/68  Pulse 60  Temp 97.3 F (36.3 C)  Resp 16  Ht 5\' 2"  (1.575 m)  Wt 135 lb 12.8 oz (61.598 kg)  BMI 24.84 kg/m2 General: Appears well Abdomen: Incision is well-healed and nontender  Pathology showed chronic cholecystitis without evidence of malignancy  Assessment and plan: Recovering well for laparoscopic cholecystectomy but she is continuing to have to have episodic mid abdominal pain. She is still early on after surgery and we discussed that this might improve over time or cholecystectomy might not have been answered to her symptoms. We will monitor for now I will see her back in 2 months.

## 2012-05-18 ENCOUNTER — Ambulatory Visit
Admission: RE | Admit: 2012-05-18 | Discharge: 2012-05-18 | Disposition: A | Discharge status: Home / Self Care | Payer: MEDICARE | Source: Ambulatory Visit | Attending: Physician Assistant | Admitting: Physician Assistant

## 2012-05-18 DIAGNOSIS — C50911 Malignant neoplasm of unspecified site of right female breast: Secondary | ICD-10-CM

## 2012-06-01 ENCOUNTER — Telehealth: Payer: Self-pay | Admitting: Internal Medicine

## 2012-06-01 NOTE — Telephone Encounter (Signed)
Patient Information:  Caller Name: Nicki Guadalajara  Phone: [phone]  Patient: Katelyn [name], Katelyn [name]  Gender: Female  DOB: Jun 24, 1918  Age: 79 Years  PCP: Rene Paci (Adults only)  Office Follow Up:  Does the office need to follow up with this patient?: No  Instructions For The Office: N/A   Symptoms  Reason For Call & Symptoms: Pt has had a stomach virus that started 05/23/12 and now the vomiting has stopped. She wants to know what else can they do for her?   No one is with the pt now and she cannot hear the phone.  She is also having right shoulder pain.  Instructed to call back when a family member is with her for triage questions.  Reviewed Health History In EMR: N/A  Reviewed Medications In EMR: N/A  Reviewed Allergies In EMR: N/A  Reviewed Surgeries / Procedures: N/A  Date of Onset of Symptoms: 05/23/2012  Guideline(s) Used:  No Protocol Available - Information Only  Disposition Per Guideline:   Home Care  Reason For Disposition Reached:   Information only question and nurse able to answer  Advice Given:  N/A  RN Overrode Recommendation:  Document Patient  Family will have to call back when one of them is with the pt.

## 2012-06-05 ENCOUNTER — Encounter: Payer: Self-pay | Admitting: Internal Medicine

## 2012-06-05 ENCOUNTER — Other Ambulatory Visit (INDEPENDENT_AMBULATORY_CARE_PROVIDER_SITE_OTHER): Payer: MEDICARE

## 2012-06-05 ENCOUNTER — Ambulatory Visit (INDEPENDENT_AMBULATORY_CARE_PROVIDER_SITE_OTHER): Payer: MEDICARE | Admitting: Internal Medicine

## 2012-06-05 VITALS — BP 110/72 | HR 70 | Temp 97.4°F | Wt 135.1 lb

## 2012-06-05 DIAGNOSIS — M7551 Bursitis of right shoulder: Secondary | ICD-10-CM

## 2012-06-05 DIAGNOSIS — E039 Hypothyroidism, unspecified: Secondary | ICD-10-CM

## 2012-06-05 DIAGNOSIS — E860 Dehydration: Secondary | ICD-10-CM

## 2012-06-05 DIAGNOSIS — A084 Viral intestinal infection, unspecified: Secondary | ICD-10-CM

## 2012-06-05 DIAGNOSIS — M67919 Unspecified disorder of synovium and tendon, unspecified shoulder: Secondary | ICD-10-CM

## 2012-06-05 DIAGNOSIS — A088 Other specified intestinal infections: Secondary | ICD-10-CM

## 2012-06-05 LAB — BASIC METABOLIC PANEL
BUN: 14 mg/dL (ref 6–23)
Calcium: 9.1 mg/dL (ref 8.4–10.5)
Creatinine, Ser: 0.9 mg/dL (ref 0.4–1.2)
GFR: 60.78 mL/min (ref >60.00)
Glucose, Bld: 99 mg/dL (ref 70–99)

## 2012-06-05 LAB — CBC WITH DIFFERENTIAL/PLATELET
Basophils Absolute: 0 10*3/uL (ref 0.0–0.1)
Eosinophils Relative: 0.3 % (ref 0.0–5.0)
HCT: 37.8 % (ref 36.0–46.0)
Hemoglobin: 12.9 g/dL (ref 12.0–15.0)
Lymphocytes Relative: 21.9 % (ref 12.0–46.0)
Lymphs Abs: 1.4 10*3/uL (ref 0.7–4.0)
Monocytes Relative: 11.8 % (ref 3.0–12.0)
Neutro Abs: 4.1 10*3/uL (ref 1.4–7.7)
Platelets: 178 10*3/uL (ref 150.0–400.0)
RDW: 14 % (ref 11.5–14.6)
WBC: 6.2 10*3/uL (ref 4.5–10.5)

## 2012-06-05 LAB — HEPATIC FUNCTION PANEL
Albumin: 3.4 g/dL — ABNORMAL LOW (ref 3.5–5.2)
Total Bilirubin: 1.2 mg/dL (ref 0.3–1.2)

## 2012-06-05 LAB — TSH: TSH: 4.28 u[IU]/mL (ref 0.35–5.50)

## 2012-06-05 MED ORDER — DIPHENOXYLATE-ATROPINE 2.5-0.025 MG PO TABS: 1.0000 | ORAL_TABLET | Freq: Four times a day (QID) | ORAL | Status: DC | PRN

## 2012-06-05 MED ORDER — ONDANSETRON HCL 4 MG PO TABS: 4.0000 mg | ORAL_TABLET | Freq: Three times a day (TID) | ORAL | Status: DC | PRN

## 2012-06-05 NOTE — Patient Instructions (Signed)
It was good to see you today. Test(s) ordered today. Your results will be released to MyChart (or called to you) after review, usually within 72hours after test completion. If any changes need to be made, you will be notified at that same time. Lomotil as needed for diarrhea and Zofran as needed for nausea or vomiting - Your prescription(s) have been submitted to your pharmacy. Please take as directed and contact our office if you believe you are having problem(s) with the medication(s). BRAT diet - see below - avoid dairy until stomach is better! we'll make referral to ortho for shoulder pain . Our office will contact you regarding appointment(s) once made. B.R.A.T. Diet Your doctor has recommended the B.R.A.T. diet for you or your child until the condition improves. This is often used to help control diarrhea and vomiting symptoms. If you or your child can tolerate clear liquids, you may have:  Bananas.   Rice.   Applesauce.   Toast (and other simple starches such as crackers, potatoes, noodles).  Be sure to avoid dairy products, meats, and fatty foods until symptoms are better. Fruit juices such as apple, grape, and prune juice can make diarrhea worse. Avoid these. Continue this diet for 2 days or as instructed by your caregiver. Document Released: 03/04/2005 Document Revised: 02/21/2011 Document Reviewed: 08/21/2006 [facility] Patient Information 2012 [place], [place].

## 2012-06-05 NOTE — Progress Notes (Signed)
Subjective:    Patient ID: Katelyn [name], female    DOB: [birth year], 79 y.o.   MRN: [id number]  Diarrhea  This is a new problem. The current episode started in the past 7 days. The problem has been unchanged. The stool consistency is described as watery. The patient states that diarrhea does not awaken her from sleep. Associated symptoms include chills, a URI and vomiting (early, none in past 4 days). Pertinent negatives include no abdominal pain, bloating, coughing, fever, headaches, increased  flatus, myalgias or weight loss. The symptoms are aggravated by diary products. There are no known risk factors. She has tried increased fluids for the symptoms. The treatment provided no relief. There is no history of bowel resection, inflammatory bowel disease, irritable bowel syndrome, malabsorption, a recent abdominal surgery or short gut syndrome.   Also complains of R shoulder pain x 6 weeks Denies injury or overuse Radiates into mid deltoid  Past Medical History  Diagnosis Date  . Hypertension   . Hypothyroidism   . Hx of colonic polyps     RLQ mensenteric mass-1.4cmx2.1cm CT 02/20/10 ? Carcinoid  . Hyperlipidemia   . Depression 08/17/1999    following death of spouse  . Hiatal hernia   . Clostridium difficile colitis   . Anxiety   . Anemia   . Parkinson disease   . Hx of adenomatous colonic polyps 06/25/01  . GERD (gastroesophageal reflux disease)   . Carcinoid tumor, malignant(M8240/3) 04/2010 SB capsule dx    09/03/10 resection; 2/9 positive LN  . Atrial fibrillation     peri/post op 08/2010 - resumed NSR  . Cholelithiasis 2011-08-17  . History of blood transfusion 1950    x 6 units  . Hard of hearing   . Cancer      BREAST- right and left/ s/p radiation, tamifoxen     Review of Systems  Constitutional: Positive for chills. Negative for fever and weight loss.  Respiratory: Negative for cough.   Gastrointestinal: Positive for vomiting (early, none in past 4 days) and diarrhea. Negative  for abdominal pain, bloating and flatus.  Musculoskeletal: Negative for myalgias.  Neurological: Negative for headaches.       Objective:   Physical Exam BP 110/72  Pulse 70  Temp(Src) 97.4 F (36.3 C) (Oral)  Wt 135 lb 1.9 oz (61.29 kg)  BMI 24.71 kg/m2  SpO2 91% Wt Readings from Last 3 Encounters:  06/05/12 135 lb 1.9 oz (61.29 kg)  04/17/12 135 lb 12.8 oz (61.598 kg)  03/25/12 132 lb 11.5 oz (60.2 kg)   Constitutional: She appears well-developed and well-nourished. No distress.  HOH - dtr at side Cardiovascular: Normal rate, regular rhythm and normal heart sounds.  No murmur heard. No BLE edema. Pulmonary/Chest: Effort normal and breath sounds normal. No respiratory distress. She has no wheezes.  Abdominal: Soft. Bowel sounds are normal. She exhibits no distension. There is no tenderness. no masses Musculoskeletal: R Shoulder: Full range of motion. Neurovascularly intact distally. Good strength with stress of rotator cuff but causes pain. Positive impingement signs. Skin: Skin is warm and dry. No rash noted. No erythema.  Psychiatric: She has a normal mood and affect. Her behavior is normal. Judgment and thought content normal.   Lab Results  Component Value Date   WBC 7.6 03/17/2012   HGB 13.6 03/17/2012   HCT 40.0 03/17/2012   PLT 178 03/17/2012   GLUCOSE 75 03/17/2012   CHOL 155 06/06/2010   TRIG 114.0 06/06/2010   HDL 69.70 06/06/2010  LDLCALC 63 06/06/2010   ALT 15 12/23/2011   AST 17 12/23/2011   NA 137 03/17/2012   K 3.8 03/17/2012   CL 100 03/17/2012   CREATININE 0.83 03/17/2012   BUN 16 03/17/2012   CO2 29 03/17/2012   TSH 0.50 08/08/2011   HGBA1C 5.9 04/28/2009        Assessment & Plan:   Diarrhea - suspect viral but ongoing > 1 week Dehydration associated with same  Check labs encouarge hydration and lytes  BRAT .lomotil and zofram prn - erx done  R RTC bursitis - unrelated to vGE above, ongoing >6 weeks - Refer to ortho for eval and ?steroid  injection after resolution of viral illness

## 2012-06-05 NOTE — Assessment & Plan Note (Signed)
Previously folllowed with endo (kumar) but now follows here Check TSH now  Lab Results  Component Value Date   TSH 0.50 08/08/2011

## 2012-06-12 ENCOUNTER — Encounter (INDEPENDENT_AMBULATORY_CARE_PROVIDER_SITE_OTHER): Payer: MEDICARE | Admitting: General Surgery

## 2012-06-16 ENCOUNTER — Telehealth: Payer: Self-pay | Admitting: Internal Medicine

## 2012-06-16 NOTE — Telephone Encounter (Signed)
Called [facility] to verify per rob/pharamcist they did not received. Gave lomotil verbally. Notified daughter rx was call into pharmacy...Raechel Chute

## 2012-06-16 NOTE — Telephone Encounter (Signed)
Patient Information:  Caller Name: Brookelynne Dimperio Daughter  Phone: [phone]  Patient: Katelyn [name], Katelyn [name]  Gender: Female  DOB: 1919-01-22  Age: 79 Years  PCP: Rene Paci (Adults only)  Office Follow Up:  Does the office need to follow up with this patient?: Yes  Instructions For The Office: Daughter wants to know if the Lomotil will be called in to pharmacy.  It was never received. Does her mother need to be seen or can they try the lomotil.  Please advise [phone]  RN Note:  Daughter wants to know what do?  The Lomotil never did make it to the pharmacy.  Diarrhea has picked back up.  What is the recommendation- Try the lomotil - Rx must be sent or see in the office. ?  Please advise- [phone]  Symptoms  Reason For Call & Symptoms: Daughter states her mother is having diarrhea. She has had the virus for 3-4 weeks off /on.  she has been seen in the office for the symptoms.  Not feeling well the last 3-4 days. Stools > 6-7 , she has soiled her clothes and they her in adult depends.  Daughter states they office on 06/05/12 gave her medication for Diarrhea and Zofran but the diarrhea medication never showed up at the pharmacy.   Daughter didn't call about the diarrhea medication because she seemed to be getting better but now it has returned  Reviewed Health History In EMR: Yes  Reviewed Medications In EMR: Yes  Reviewed Allergies In EMR: Yes  Reviewed Surgeries / Procedures: Yes  Date of Onset of Symptoms: 06/05/2012  Guideline(s) Used:  Diarrhea  Disposition Per Guideline:   Go to Office Now  Reason For Disposition Reached:   Age > 60 years and has had > 6 diarrhea stools in past 24 hours  Advice Given:  Fluids:  Drink more fluids, at least 8-10 glasses (8 oz or 240 ml) daily.  For example: sports drinks, diluted fruit juices, soft drinks.  Supplement this with saltine crackers or soups to make certain that you are getting sufficient fluid and salt to meet your  body's needs.  Avoid caffeinated beverages (Reason: caffeine is mildly dehydrating).  Nutrition:  Maintaining some food intake during episodes of diarrhea is important.  Ideal initial foods include boiled starches/cereals (e.g., potatoes, rice, noodles, wheat, oats) with a small amount of salt to taste.  Other acceptable foods include: bananas, yogurt, crackers, soup.  As your stools return to normal consistency, resume a normal diet.  Call Back If:  Signs of dehydration occur (e.g., no urine for more than 12 hours, very dry mouth, lightheaded, etc.)  Diarrhea lasts over 7 days  You become worse.  Patient Will Follow Care Advice:  YES

## 2012-06-30 ENCOUNTER — Other Ambulatory Visit: Payer: Self-pay | Admitting: Internal Medicine

## 2012-06-30 NOTE — Telephone Encounter (Signed)
Faxed script bck to [facility].../lmb 

## 2012-07-09 ENCOUNTER — Other Ambulatory Visit: Payer: Self-pay | Admitting: Internal Medicine

## 2012-08-06 ENCOUNTER — Other Ambulatory Visit: Payer: Self-pay | Admitting: Internal Medicine

## 2012-08-14 ENCOUNTER — Ambulatory Visit (INDEPENDENT_AMBULATORY_CARE_PROVIDER_SITE_OTHER): Payer: MEDICARE | Admitting: Internal Medicine

## 2012-08-14 ENCOUNTER — Encounter: Payer: Self-pay | Admitting: Internal Medicine

## 2012-08-14 ENCOUNTER — Other Ambulatory Visit (INDEPENDENT_AMBULATORY_CARE_PROVIDER_SITE_OTHER): Payer: MEDICARE

## 2012-08-14 VITALS — BP 110/72 | HR 59 | Temp 96.9°F | Ht 64.0 in | Wt 137.2 lb

## 2012-08-14 DIAGNOSIS — I10 Essential (primary) hypertension: Secondary | ICD-10-CM

## 2012-08-14 DIAGNOSIS — R350 Frequency of micturition: Secondary | ICD-10-CM | POA: Insufficient documentation

## 2012-08-14 DIAGNOSIS — F411 Generalized anxiety disorder: Secondary | ICD-10-CM

## 2012-08-14 LAB — URINALYSIS, ROUTINE W REFLEX MICROSCOPIC
Bilirubin Urine: NEGATIVE
Ketones, ur: NEGATIVE
Nitrite: NEGATIVE
Urobilinogen, UA: 0.2 (ref 0.0–1.0)
pH: 6 (ref 5.0–8.0)

## 2012-08-14 MED ORDER — CEPHALEXIN 500 MG PO CAPS: 500.0000 mg | ORAL_CAPSULE | Freq: Four times a day (QID) | ORAL | Status: DC

## 2012-08-14 NOTE — Progress Notes (Signed)
Subjective:    Patient ID: Katelyn [name], female    DOB: 01-Jul-1918, 79 y.o.   MRN: [id number]  HPI  Here with daughter, c/o mild 2-3 days gradually worsening urinary freq, urgency and mild low mid abd discomfort with some nausea, but no fever, back pain, vomiting, chills, and Denies urinary symptoms such as flank pain, hematuria.  No recent UTI tx.  Pt denies chest pain, increased sob or doe, wheezing, orthopnea, PND, increased LE swelling, palpitations, dizziness or syncope. Pt denies new neurological symptoms such as new headache, or facial or extremity weakness or numbness, no confusion.  Pt denies polydipsia, polyuria. Denies worsening depressive symptoms, suicidal ideation, or panic Past Medical History  Diagnosis Date  . Hypertension   . Hypothyroidism   . Hx of colonic polyps     RLQ mensenteric mass-1.4cmx2.1cm CT 02/20/10 ? Carcinoid  . Hyperlipidemia   . Depression 08-07-99    following death of spouse  . Hiatal hernia   . Clostridium difficile colitis   . Anxiety   . Anemia   . Parkinson disease   . Hx of adenomatous colonic polyps 06/25/01  . GERD (gastroesophageal reflux disease)   . Carcinoid tumor, malignant 04/2010 SB capsule dx    09/03/10 resection; 2/9 positive LN  . Atrial fibrillation     peri/post op 08/2010 - resumed NSR  . Cholelithiasis Aug 07, 2011  . History of blood transfusion 1950    x 6 units  . Hard of hearing   . Cancer      BREAST- right and left/ s/p radiation, tamifoxen   Past Surgical History  Procedure Laterality Date  . Abdominal hysterectomy  1969  . Back surgery  1959  . Breast surgery  1995    (R) Lumpectomy  . Mastectomy, partial  2005    left  . Eye surgery      bilateral cataract extraction with IOL,   repair left tear duct  . Colon surgery      resection for carcinoid tumor  . Cholecystectomy  03/25/2012    Procedure: LAPAROSCOPIC CHOLECYSTECTOMY WITH INTRAOPERATIVE CHOLANGIOGRAM;  Surgeon: Mariella Saa, MD;  Location: [facility];   Service: General;  Laterality: N/A;    reports that she has never smoked. She has never used smokeless tobacco. She reports that she does not drink alcohol or use illicit drugs. family history includes Breast cancer in her daughter, mother, and sister; Colon cancer in her father and mother; Colon polyps in her daughter and son; Hyperlipidemia in her mother; Hypertension in her mother; and Prostate cancer in her father. No Known Allergies Current Outpatient Prescriptions on File Prior to Visit  Medication Sig Dispense Refill  . amLODipine (NORVASC) 5 MG tablet Take 5 mg by mouth daily after breakfast.       . anastrozole (ARIMIDEX) 1 MG tablet Take 1 mg by mouth daily after breakfast.       . atorvastatin (LIPITOR) 20 MG tablet Take 1 tablet by mouth  daily  90 tablet  1  . Calcium-Vitamin D-Iron (CALCIUM 600 IRON/D) 841-324-40 MG-UNIT-MG TABS Take 1 capsule by mouth daily.       . diphenoxylate-atropine (LOMOTIL) 2.5-0.025 MG per tablet TAKE 1 TABLET BY MOUTH FOUR TIMES DAILY AS NEEDED FOR DIARRHEA/LOOSE STOOL  30 tablet  0  . levothyroxine (SYNTHROID, LEVOTHROID) 50 MCG tablet TAKE ONE TABLET BY MOUTH DAILY  30 tablet  4  . metoprolol tartrate (LOPRESSOR) 25 MG tablet       . Multiple Vitamins-Minerals (  CENTRUM SILVER) tablet Take 1 tablet by mouth daily.       Marland Kitchen omeprazole (PRILOSEC) 40 MG capsule Take 40 mg by mouth every morning.      . ondansetron (ZOFRAN) 4 MG tablet Take 1 tablet (4 mg total) by mouth every 8 (eight) hours as needed for nausea.  20 tablet  0   No current facility-administered medications on file prior to visit.   Review of Systems  Constitutional: Negative for unexpected weight change, or unusual diaphoresis  HENT: Negative for tinnitus.   Eyes: Negative for photophobia and visual disturbance.  Respiratory: Negative for choking and stridor.   Gastrointestinal: Negative for vomiting and blood in stool.  Genitourinary: Negative for hematuria and decreased urine volume.   Musculoskeletal: Negative for acute joint swelling Skin: Negative for color change and wound.  Neurological: Negative for tremors and numbness other than noted  Psychiatric/Behavioral: Negative for decreased concentration or  hyperactivity.       Objective:   Physical Exam BP 110/72  Pulse 59  Temp(Src) 96.9 F (36.1 C) (Oral)  Ht 5\' 4"  (1.626 m)  Wt 137 lb 4 oz (62.256 kg)  BMI 23.55 kg/m2  SpO2 97% VS noted, frail, elderly Constitutional: Pt appears nontoxic.  HENT: Head: NCAT.  Right Ear: External ear normal.  Left Ear: External ear normal.  Eyes: Conjunctivae and EOM are normal. Pupils are equal, round, and reactive to light.  Neck: Normal range of motion. Neck supple.  Cardiovascular: Normal rate and regular rhythm.   Pulmonary/Chest: Effort normal and breath sounds normal.  Abd:  Soft, NT, non-distended, + BS, no flank tender Neurological: Pt is alert. At baseline mentation per daughter  Skin: Skin is warm. No erythema.  Psychiatric: Pt behavior is normal. Thought content normal.     Assessment & Plan:

## 2012-08-14 NOTE — Patient Instructions (Signed)
Please take all new medication as prescribed - the antibiotic Please continue all other medications as before We will send your specimen for studies today Please call or return for any worsening symptoms  Please remember to sign up for My Chart if you have not done so, as this will be important to you in the future with finding out test results, communicating by private email, and scheduling acute appointments online when needed.

## 2012-08-14 NOTE — Assessment & Plan Note (Signed)
Exam benign but suspect mild cystitis, for urine studies, cephalexin asd,  to f/u any worsening symptoms or concerns

## 2012-08-14 NOTE — Assessment & Plan Note (Signed)
stable overall by history and exam, and pt to continue medical treatment as before,  to f/u any worsening symptoms or concerns 

## 2012-08-14 NOTE — Assessment & Plan Note (Signed)
stable overall by history and exam, recent data reviewed with pt, and pt to continue medical treatment as before,  to f/u any worsening symptoms or concerns BP Readings from Last 3 Encounters:  08/14/12 110/72  06/05/12 110/72  04/17/12 122/68

## 2012-08-16 LAB — URINE CULTURE: Colony Count: 25000

## 2012-08-17 ENCOUNTER — Encounter: Payer: Self-pay | Admitting: Internal Medicine

## 2012-08-19 ENCOUNTER — Telehealth: Payer: Self-pay | Admitting: *Deleted

## 2012-08-19 NOTE — Telephone Encounter (Signed)
Pt's daughter calling regarding results of UA and culture from last OV.

## 2012-08-19 NOTE — Telephone Encounter (Signed)
Called the daughter left detailed message of MD instructions

## 2012-08-19 NOTE — Telephone Encounter (Signed)
Letter was sent, labs neg, no need for change of tx

## 2012-09-10 ENCOUNTER — Other Ambulatory Visit: Payer: Self-pay | Admitting: Internal Medicine

## 2012-10-02 ENCOUNTER — Ambulatory Visit (INDEPENDENT_AMBULATORY_CARE_PROVIDER_SITE_OTHER): Payer: MEDICARE | Admitting: General Surgery

## 2012-10-02 ENCOUNTER — Telehealth: Payer: Self-pay | Admitting: Internal Medicine

## 2012-10-02 ENCOUNTER — Encounter (INDEPENDENT_AMBULATORY_CARE_PROVIDER_SITE_OTHER): Payer: Self-pay | Admitting: General Surgery

## 2012-10-02 ENCOUNTER — Telehealth (INDEPENDENT_AMBULATORY_CARE_PROVIDER_SITE_OTHER): Payer: Self-pay

## 2012-10-02 VITALS — BP 120/82 | HR 78 | Resp 16 | Ht 64.0 in | Wt 136.8 lb

## 2012-10-02 DIAGNOSIS — R109 Unspecified abdominal pain: Secondary | ICD-10-CM

## 2012-10-02 NOTE — Telephone Encounter (Signed)
Scheduled patient on 10/09/12 at 2:30 PM with Dr. Juanda Chance. Christy notified.

## 2012-10-02 NOTE — Telephone Encounter (Signed)
Rec'd call from Dr. Regino Schultze office and they have worked her in for Friday 10/09/12 @ 2:30pm, arrival time 2:15pm.  At [facility] (patient daughter) request I called and left a message with appt date & time to see Dr. Patterson Hammersmith.

## 2012-10-02 NOTE — Progress Notes (Signed)
History: Patient returns 4 long term followup status post laparoscopic cholecystectomy with history of persistent nocturnal abdominal pain and history of small bowel resection for carcinoid tumor. This pain has been persistent since before her carcinoid was diagnosed and was not alleviated by her small bowel resection. Extensive workup was negative except for cholelithiasis. She unfortunately continues to have the same pain. This is the same pain she had before her small bowel resection for carcinoid tumor as well. She describes aching pain intermittent abdomen which occurs only at night when lying down. She will get up and move around it gets better but basically keeps her up all night. She is entirely asymptomatic during the day. No associated GI symptoms.  Exam: BP 120/82  Pulse 78  Resp 16  Ht 5\' 4"  (1.626 m)  Wt 136 lb 12.8 oz (62.052 kg)  BMI 23.47 kg/m2 General: Appears well, Elderly Caucasian female Abdomen: Incisions well-healed. There is mild mid abdominal tenderness. No hernias or masses or organomegaly.   Assessment and plan: persistent nocturnal abdominal pain. This has been present since before her small bowel resection for carcinoid. Followup CT scan showed no evidence of bowel blockage or residual tumor. At this point I really a loss to explain her symptoms. I discussed this with the patient and her family today. I would ask Dr. Juanda Chance to take another look at her to see if she has any further ideas regarding possible functional abdominal pain etc..

## 2012-10-02 NOTE — Telephone Encounter (Signed)
Call [facility] (Dr. Juanda Chance) to schedule appointment for Katelyn [name].  Message sent to Dr. Regino Schultze nurse to see if patient can be worked into a date earlier than mid August.

## 2012-10-07 ENCOUNTER — Encounter: Payer: Self-pay | Admitting: *Deleted

## 2012-10-09 ENCOUNTER — Encounter: Payer: Self-pay | Admitting: Internal Medicine

## 2012-10-09 ENCOUNTER — Ambulatory Visit (INDEPENDENT_AMBULATORY_CARE_PROVIDER_SITE_OTHER): Payer: MEDICARE | Admitting: Internal Medicine

## 2012-10-09 VITALS — BP 122/68 | HR 80 | Ht 64.0 in | Wt 135.2 lb

## 2012-10-09 DIAGNOSIS — R109 Unspecified abdominal pain: Secondary | ICD-10-CM

## 2012-10-09 DIAGNOSIS — R339 Retention of urine, unspecified: Secondary | ICD-10-CM

## 2012-10-09 MED ORDER — DICYCLOMINE HCL 10 MG PO CAPS: 10.0000 mg | ORAL_CAPSULE | Freq: Every day | ORAL | Status: DC

## 2012-10-09 NOTE — Progress Notes (Signed)
Katelyn [name] 1918-05-31 MRN [id number]   History of Present Illness:  This is a 79 year old white female with chronic lower abdominal pain which has been evaluated multiple times and is still presentt, occurring only at night, usually around 3:00 in the morning. It wakes her up and she has to sit up and go to the bathroom and urinates. Pain is usually relieved by urination.She sometimes has difficulty initiating the urination. She underwent a recent laparoscopic cholecystectomy in January 2014 and prior to that, a small bowel resection for carcinoid tumor of the distal small bowel in 08-04-10. A CT scan of the abdomen in September 2013 showed no recurrence of the carcinoid tumor. She had a moderate size hiatal hernia. Her last colonoscopy in 03-Aug-2005 was normal. Her eating habits are normal. There is no nausea or vomiting. Her stools are soft. She has a history of frequent urinary tract infections. She reports difficulty urinating at times, especially at night.   Past Medical History  Diagnosis Date  . Hypertension   . Hypothyroidism   . Hx of colonic polyps     RLQ mensenteric mass-1.4cmx2.1cm CT 02/20/10 ? Carcinoid  . Hyperlipidemia   . Depression 1999-08-04    following death of spouse  . Hiatal hernia   . Clostridium difficile colitis   . Anxiety   . Anemia   . Parkinson disease   . Hx of adenomatous colonic polyps 06/25/01  . GERD (gastroesophageal reflux disease)   . Carcinoid tumor, malignant     09/03/10 resection; 2/9 positive LN  . Atrial fibrillation     peri/post op 08/2010 - resumed NSR  . Cholelithiasis Aug 04, 2011  . History of blood transfusion 1950    x 6 units  . Hard of hearing   . Cancer      BREAST- right and left/ s/p radiation, tamifoxen   Past Surgical History  Procedure Laterality Date  . Abdominal hysterectomy  1969  . Back surgery  1959  . Breast surgery  1995    (R) Lumpectomy  . Mastectomy, partial  2005    left  . Eye surgery      bilateral cataract extraction  with IOL,   repair left tear duct  . Colon surgery      resection for carcinoid tumor  . Cholecystectomy  03/25/2012    Procedure: LAPAROSCOPIC CHOLECYSTECTOMY WITH INTRAOPERATIVE CHOLANGIOGRAM;  Surgeon: Mariella Saa, MD;  Location: [facility];  Service: General;  Laterality: N/A;    reports that she has never smoked. She has never used smokeless tobacco. She reports that she does not drink alcohol or use illicit drugs. family history includes Breast cancer in her daughter, mother, and sister; Colon cancer in her father and mother; Colon polyps in her daughter and son; Hyperlipidemia in her mother; Hypertension in her mother; and Prostate cancer in her father. No Known Allergies      Review of Systems: Low abdominal pain as described above. Denies diarrhea or rectal bleeding constipation  The remainder of the 10 point ROS is negative except as outlined in H&P   Physical Exam: General appearance  Well developed, in no distress. Hard of hearing Eyes- non icteric. HEENT nontraumatic, normocephalic. Mouth no lesions, tongue papillated, no cheilosis. Neck supple without adenopathy, thyroid not enlarged, no carotid bruits, no JVD. Lungs Clear to auscultation bilaterally. Cor normal S1, normal S2, regular rhythm, no murmur,  quiet precordium. Abdomen: Soft. Mildly protuberant. Normal active bowel sounds. Well-healed surgical scars. Minimal tenderness in suprapubic area. Liver  edge at costal margin. Rectal: Soft Hemoccult negative stool. Extremities no pedal edema. Skin no lesions. Multiple ecchymoses and abrasions. Neurological alert and oriented x 3. Psychological normal mood and affect.  Assessment and Plan:  Problem #31 79 year old white female with persistent lower abdominal pain occurring only at night. She has been evaluated on multiple occasions. Today, it seems to me that the pain could be related to an overdistended urinary bladder. She may have urinary retention at night,  especially since she has been trying to drink a lot of fluids before she goes to bed. The pain seems to be relieved by emptying her bladder. We will refer her to urology to assess her for dilated or atonic bladder . I will give her a trial of Bentyl 10 mg at bedtime and I asked her not to drink any liquids after 6 PM. We will proceed with a CT scan of the abdomen and pelvis to rule out any recurrence of the carcinoid tumor.   10/09/2012 Katelyn [name]

## 2012-10-09 NOTE — Patient Instructions (Addendum)
We have sent the following medications to your pharmacy for you to pick up at your convenience: Bentyl  You have been scheduled for an appointment with Dr Lorin Picket McDermott @ [facility] on 10/26/12 at 3:15 pm. You should arrive at 3 pm for registration and to fill out paperwork. Please bring a current list of medications as well as insurance cards and copay if you have one.  You have been scheduled for a CT scan of the abdomen and pelvis at [facility] ([address]---this is in the same building as [facility]).   You are scheduled on 10/13/12 at 2:30 pm. You should arrive 15 minutes prior to your appointment time for registration. Please follow the written instructions below on the day of your exam:  WARNING: IF YOU ARE ALLERGIC TO IODINE/X-RAY DYE, PLEASE NOTIFY RADIOLOGY IMMEDIATELY AT [phone]! YOU WILL BE GIVEN A 13 HOUR PREMEDICATION PREP.  1) Do not eat or drink anything after 10:30 am (4 hours prior to your test) 2) You have been given 2 bottles of oral contrast to drink. The solution may taste better if refrigerated, but do NOT add ice or any other liquid to this solution. Shake well before drinking.    Drink 1 bottle of contrast @ 12:30 pm (2 hours prior to your exam)  Drink 1 bottle of contrast @ 1:30 pm (1 hour prior to your exam)  You may take any medications as prescribed with a small amount of water except for the following: Metformin, Glucophage, Glucovance, Avandamet, Riomet, Fortamet, Actoplus Met, Janumet, Glumetza or Metaglip. The above medications must be held the day of the exam AND 48 hours after the exam.  The purpose of you drinking the oral contrast is to aid in the visualization of your intestinal tract. The contrast solution may cause some diarrhea. Before your exam is started, you will be given a small amount of fluid to drink. Depending on your individual set of symptoms, you may also receive an intravenous injection of x-ray  contrast/dye. Plan on being at [facility] for 30 minutes or long, depending on the type of exam you are having performed.  This test typically takes 30-45 minutes to complete.  If you have any questions regarding your exam or if you need to reschedule, you may call the CT department at [phone] between the hours of 8:00 am and 5:00 pm, Monday-Friday.  ________________________________________________________________________

## 2012-10-12 ENCOUNTER — Other Ambulatory Visit: Payer: Self-pay | Admitting: Internal Medicine

## 2012-10-12 ENCOUNTER — Telehealth: Payer: Self-pay | Admitting: Internal Medicine

## 2012-10-12 DIAGNOSIS — R109 Unspecified abdominal pain: Secondary | ICD-10-CM

## 2012-10-12 NOTE — Telephone Encounter (Signed)
I agree with holding the medication till she sees the urologist.

## 2012-10-12 NOTE — Telephone Encounter (Signed)
Patient's daughter reports severe weakness after taking dicyclomine.  She is going to hold the medication today and if her symptoms don't improve by tomorrow she will need to see her primary care.  Symptoms were bilateral and weakness was in her feet.  She will call back for any additional GI concerns.  They have moved her CT to tomorrow afternoon, due to the weakness

## 2012-10-13 ENCOUNTER — Ambulatory Visit (INDEPENDENT_AMBULATORY_CARE_PROVIDER_SITE_OTHER)
Admission: RE | Admit: 2012-10-13 | Discharge: 2012-10-13 | Disposition: A | Discharge status: Home / Self Care | Payer: MEDICARE | Source: Ambulatory Visit | Attending: Internal Medicine | Admitting: Internal Medicine

## 2012-10-13 ENCOUNTER — Other Ambulatory Visit (INDEPENDENT_AMBULATORY_CARE_PROVIDER_SITE_OTHER): Payer: MEDICARE

## 2012-10-13 DIAGNOSIS — R109 Unspecified abdominal pain: Secondary | ICD-10-CM

## 2012-10-13 LAB — BUN: BUN: 11 mg/dL (ref 6–23)

## 2012-10-13 MED ORDER — IOHEXOL 300 MG/ML  SOLN
80.0000 mL | Freq: Once | INTRAMUSCULAR | Status: AC | PRN
  Administered 2012-10-13: 80 mL via INTRAVENOUS

## 2012-10-15 ENCOUNTER — Telehealth: Payer: Self-pay | Admitting: Internal Medicine

## 2012-10-15 NOTE — Telephone Encounter (Signed)
Spoke with pts daughter and reviewed CT results with them. See result note and Dr. Regino Schultze comments. Daughter states her mother is still having weakness in her legs. Thought the bentyl might have caused that so they held it for several days but pt is still having the weakness in her legs. Instructed pts daughter to call her PCP regarding the weakness.

## 2012-10-16 ENCOUNTER — Ambulatory Visit: Payer: MEDICARE | Admitting: Internal Medicine

## 2012-10-21 ENCOUNTER — Other Ambulatory Visit: Payer: Self-pay

## 2012-11-02 ENCOUNTER — Other Ambulatory Visit: Payer: Self-pay | Admitting: Internal Medicine

## 2012-11-26 ENCOUNTER — Ambulatory Visit (INDEPENDENT_AMBULATORY_CARE_PROVIDER_SITE_OTHER): Payer: MEDICARE | Admitting: Internal Medicine

## 2012-11-26 ENCOUNTER — Encounter: Payer: Self-pay | Admitting: Internal Medicine

## 2012-11-26 VITALS — BP 120/72 | HR 66 | Temp 98.7°F | Wt 138.0 lb

## 2012-11-26 DIAGNOSIS — R3989 Other symptoms and signs involving the genitourinary system: Secondary | ICD-10-CM

## 2012-11-26 DIAGNOSIS — F411 Generalized anxiety disorder: Secondary | ICD-10-CM

## 2012-11-26 DIAGNOSIS — R102 Pelvic and perineal pain: Secondary | ICD-10-CM | POA: Insufficient documentation

## 2012-11-26 DIAGNOSIS — Z23 Encounter for immunization: Secondary | ICD-10-CM

## 2012-11-26 DIAGNOSIS — R1024 Suprapubic pain: Secondary | ICD-10-CM | POA: Insufficient documentation

## 2012-11-26 DIAGNOSIS — R301 Vesical tenesmus: Secondary | ICD-10-CM

## 2012-11-26 DIAGNOSIS — N390 Urinary tract infection, site not specified: Secondary | ICD-10-CM

## 2012-11-26 DIAGNOSIS — R109 Unspecified abdominal pain: Secondary | ICD-10-CM

## 2012-11-26 DIAGNOSIS — R1030 Lower abdominal pain, unspecified: Secondary | ICD-10-CM

## 2012-11-26 LAB — POCT URINALYSIS DIPSTICK
Blood, UA: NEGATIVE
Protein, UA: NEGATIVE
Spec Grav, UA: <=1.005
Urobilinogen, UA: 0.2
pH, UA: 5

## 2012-11-26 MED ORDER — NORTRIPTYLINE HCL 10 MG PO CAPS: 10.0000 mg | ORAL_CAPSULE | Freq: Every day | ORAL | Status: DC

## 2012-11-26 NOTE — Assessment & Plan Note (Signed)
continue prn xanax

## 2012-11-26 NOTE — Progress Notes (Signed)
Subjective:    Patient ID: Katelyn [name], female    DOB: 1918/07/12, 79 y.o.   MRN: [id number]  HPI Here with "urinary problems" Onset >12 mo ago chronic suprapubic pain and burning - cramping pain that causes urge sensation and awakes from sleep every day 4AM No fever or dysuria. No gross hematuria associated with frequency and small volume/no volume voiding Uro eval 10/2012 for same - no retention or GU issue GI eval - no GI issue, CT summer 2014 ok Also s/p cole without change in symptoms   Also reviewed chronic medical issues:  recurrent breast ca on R 04/2010 med change from tamoxifen to armidex early Jul 27, 2010 - No surg planned for breast ca.  carcinoid SB cancer - dx 04/2010 with endo/capsule reports  - s/p surg resection  HTN - reports compliance with ongoing medical treatment. denies adverse side effects related to current therapy. prev on toprol xr -changed to two times a day for cost concerns   hypothyroid - has followed with endo for same in past but now follows here - reports compliance with ongoing medical treatment but freq changes in medication dose or frequency. denies adverse side effects related to current therapy.   dyslipidemia - reports compliance with ongoing medical treatment and no changes in medication dose or frequency. denies adverse side effects related to current therapy.   Past Medical History  Diagnosis Date  . Hypertension   . Hypothyroidism   . Hx of colonic polyps     RLQ mensenteric mass-1.4cmx2.1cm CT 02/20/10 ? Carcinoid  . Hyperlipidemia   . Depression Jul 27, 1999    following death of spouse  . Hiatal hernia   . Clostridium difficile colitis   . Anxiety   . Anemia   . Parkinson disease   . Hx of adenomatous colonic polyps 06/25/01  . GERD (gastroesophageal reflux disease)   . Carcinoid tumor, malignant     09/03/10 resection; 2/9 positive LN  . Atrial fibrillation     peri/post op 08/2010 - resumed NSR  . Cholelithiasis Jul 27, 2011  . History of blood  transfusion 1950    x 6 units  . Hard of hearing   . Cancer      BREAST- right and left/ s/p radiation, tamifoxen    Review of Systems  Respiratory: Negative for cough.   Cardiovascular: Negative for chest pain.  Genitourinary: Positive for dysuria. Negative for vaginal bleeding and difficulty urinating.       Objective:   Physical Exam  BP 120/72  Pulse 66  Temp(Src) 98.7 F (37.1 C) (Oral)  Wt 138 lb (62.596 kg)  BMI 23.68 kg/m2  SpO2 95% Wt Readings from Last 3 Encounters:  11/26/12 138 lb (62.596 kg)  10/09/12 135 lb 4 oz (61.349 kg)  10/02/12 136 lb 12.8 oz (62.052 kg)   Constitutional: She appears well-developed and well-nourished. No distress. Dtr at side HENT: Very HOH!  Cardiovascular: Normal rate, regular rhythm and normal heart sounds.  No murmur heard. No BLE edema. Pulmonary/Chest: Effort normal and breath sounds normal. No respiratory distress. She has no wheezes.  Abdominal: Soft. Bowel sounds are normal. She exhibits no distension. There is no suprapubic tenderness.  Psychiatric: She has an anxious mood and affect. Her behavior is normal. Judgment and thought content normal.       Lab Results  Component Value Date   WBC 6.2 06/05/2012   HGB 12.9 06/05/2012   HCT 37.8 06/05/2012   PLT 178.0 06/05/2012   CHOL 155 06/06/2010  TRIG 114.0 06/06/2010   HDL 69.70 06/06/2010   ALT 17 06/05/2012   AST 21 06/05/2012   NA 137 06/05/2012   K 3.7 06/05/2012   CL 103 06/05/2012   CREATININE 0.9 10/13/2012   BUN 11 10/13/2012   CO2 25 06/05/2012   TSH 4.28 06/05/2012   HGBA1C 5.9 04/28/2009     Assessment & Plan:   See problem list. Medications and labs reviewed today.

## 2012-11-26 NOTE — Patient Instructions (Signed)
It was good to see you today. We have reviewed your prior records including labs and tests today Medications reviewed and updated, start nortriptyline at bedtime for bladder spasm pain and sleep aid -no other changes recommended at this time. Your prescription(s) have been submitted to your pharmacy. Please take as directed and contact our office if you believe you are having problem(s) with the medication(s).

## 2012-12-08 ENCOUNTER — Other Ambulatory Visit (HOSPITAL_BASED_OUTPATIENT_CLINIC_OR_DEPARTMENT_OTHER): Payer: MEDICARE | Admitting: Lab

## 2012-12-08 ENCOUNTER — Ambulatory Visit (HOSPITAL_BASED_OUTPATIENT_CLINIC_OR_DEPARTMENT_OTHER): Payer: MEDICARE | Admitting: Oncology

## 2012-12-08 VITALS — BP 130/81 | HR 78 | Temp 98.4°F | Resp 20 | Ht 64.0 in | Wt 138.8 lb

## 2012-12-08 DIAGNOSIS — Z170 Estrogen receptor positive status [ER+]: Secondary | ICD-10-CM

## 2012-12-08 DIAGNOSIS — C50911 Malignant neoplasm of unspecified site of right female breast: Secondary | ICD-10-CM

## 2012-12-08 DIAGNOSIS — C50512 Malignant neoplasm of lower-outer quadrant of left female breast: Secondary | ICD-10-CM

## 2012-12-08 DIAGNOSIS — C50219 Malignant neoplasm of upper-inner quadrant of unspecified female breast: Secondary | ICD-10-CM

## 2012-12-08 DIAGNOSIS — C50519 Malignant neoplasm of lower-outer quadrant of unspecified female breast: Secondary | ICD-10-CM

## 2012-12-08 DIAGNOSIS — C7A019 Malignant carcinoid tumor of the small intestine, unspecified portion: Secondary | ICD-10-CM

## 2012-12-08 DIAGNOSIS — C50211 Malignant neoplasm of upper-inner quadrant of right female breast: Secondary | ICD-10-CM | POA: Insufficient documentation

## 2012-12-08 LAB — COMPREHENSIVE METABOLIC PANEL (CC13)
ALT: 16 U/L (ref 0–55)
Alkaline Phosphatase: 105 U/L (ref 40–150)
Creatinine: 0.9 mg/dL (ref 0.6–1.1)
Glucose: 80 mg/dl (ref 70–140)
Sodium: 133 mEq/L — ABNORMAL LOW (ref 136–145)
Total Bilirubin: 0.68 mg/dL (ref 0.20–1.20)
Total Protein: 6.9 g/dL (ref 6.4–8.3)

## 2012-12-08 LAB — CBC WITH DIFFERENTIAL/PLATELET
BASO%: 0.5 % (ref 0.0–2.0)
LYMPH%: 21 % (ref 14.0–49.7)
MCHC: 33.3 g/dL (ref 31.5–36.0)
MCV: 92.4 fL (ref 79.5–101.0)
MONO%: 16.1 % — ABNORMAL HIGH (ref 0.0–14.0)
Platelets: 150 10*3/uL (ref 145–400)
RBC: 4.29 10*6/uL (ref 3.70–5.45)

## 2012-12-08 NOTE — Progress Notes (Signed)
ID: Katelyn [name]   DOB: 02-May-1918  MR#: [id number]  WGN#:[id number]  PCP: Rene Paci, MD GYN:  SU:  OTHER MD:   HISTORY OF PRESENT ILLNESS: Ms. Katelyn [name] had a screening mammogram on April 03, 2005, which showed questionable distortion in the left breast.  She was brought for additional views on January 24.  By physical exam Dr. Cain Saupe was able to palpate a little bit of thickening in the outer portion of the left breast.  By ultrasound this area was spiculated and solid with posterior shadowing and measured up to 1.3 cm.  Accordingly core biopsy was obtained the same day and showed ([id number] and [phone]) an invasive breast cancer, which was strongly ER positive, PR positive at 37% with a moderate proliferation marker at 17% and HER-2/neu at 2+.  FISH, however, showed low-level polysomy with a ratio of only 1.1 and so essentially negative.  With this information the patient was referred to Dr. Maryagnes Amos and on April 26, 2005 bilateral breast MRI was obtained.  This showed only the solitary mass in the upper aspect of the left breast, measuring by MRI up to 1.4 cm.  Accordingly after appropriate discussion Dr. Maryagnes Amos proceeded to left lumpectomy and lymph node sampling May 08, 2005.  This showed ([id number]) a 1.5 cm infiltrating ductal carcinoma, which was grade 1.  Margins were negative.  There was no evidence of lymphovascular invasion and 0 of 4 sentinel lymph nodes were involved.  She was treated with anastrozole initially, then switched to tamoxifen as of 2008 for financial reasons.  In 2012, the patient had routine bilateral mammography May 14, 2010 which showed  left postsurgical scarring; however, on the right there was a new oval mass, not associated with any calcifications.  Dr Si Gaul was not able to palpate an abnormality in that area, but ultrasound confirmed a 1.1 cm lobulated hypoechoic mass at the 1 o'clock position in the right breast, 6 cm from the nipple.  He did not  find any evidence of axillary adenopathy.  This was felt sufficiently suspicious to warrant biopsy and this was performed March 7, with the pathology ([id number]) showing a grade 3 invasive ductal carcinoma which was estrogen receptor positive at 96%, progesterone receptor poor at 2%, with an MIB-1 of 21% and no evidence of amplification of HER-2 by CISH, with a ratio of 0.9.   The patient did not undergo definitive surgery, but chose to be treated neoadjuvant leg. She was switched back to anastrozole in March of 2012 on which she continues.   At the same time that the patient was being worked up for this, she had been complaining of abdominal discomfort and on February 20, 2010, a CT of the abdomen and pelvis was obtained through the emergency room showing, in addition to a small hiatal hernia and no evidence of liver involvement, a spiculated mass in the right lower quadrant mesentery.  This mass had been previously noted (December 2006) but had increased in size, now measuring 2.1 cm, versus 1.3 cm previously.  The possibility of a carcinoid tumor or a low-grade lymphoma was suggested.    The patient was referred to Dr Juanda Chance, who evaluated the patient May 07, 2010, for problems with diarrhea and abdominal pain.  There is, of course, a strong family history of colon cancer and the patient also has a prior history of C. difficile colitis.  Dr Juanda Chance felt that a capsule endoscopy was the best test to obtain and this was performed on February  28, 14-Aug-2010.  This study showed a mass protruding into the lumen of the small bowel, occupying one third of the lumen, with a second mass in a separate segment.  The capsule was held at that segment for some time before passing into the cecum.  She is status post small bowel mass resection in June 2012 proving to be T3 N1, stage III well-differentiated carcinoid tumor.   INTERVAL HISTORY: Katelyn [name] returns today for followup of her breast cancer, accompanied by her  daughter Katelyn [name]. Katelyn [name] is now 7 years out from her own breast cancer).  REVIEW OF SYSTEMS: Stephanye complains of pain in her belly at night. Sometimes she gets up in the middle of the night to have a small bowel movements. Generally this pain seems to be relieved by passing gas or by having a bowel movement. She denies constipation, diarrhea, cough, phlegm production, pleurisy, worsening shortness of breath, hemoptysis, or any change in her urine habits. There has been no flushing. She denies falls. She does feel moderately fatigued, can't hear a staying even with her hearing aid, bruises easily, and is forgetful. She takes naps for about 30-40 minutes twice a day A detailed review of systems today was essentially stable.  PAST MEDICAL HISTORY: Past Medical History  Diagnosis Date  . Hypertension   . Hypothyroidism   . Hx of colonic polyps     RLQ mensenteric mass-1.4cmx2.1cm CT 02/20/10 ? Carcinoid  . Hyperlipidemia   . Depression 08/14/1999    following death of spouse  . Hiatal hernia   . Clostridium difficile colitis   . Anxiety   . Anemia   . Parkinson disease   . Hx of adenomatous colonic polyps 06/25/01  . GERD (gastroesophageal reflux disease)   . Carcinoid tumor, malignant     09/03/10 resection; 2/9 positive LN  . Atrial fibrillation     peri/post op 08/2010 - resumed NSR  . Cholelithiasis 08-14-11  . History of blood transfusion 1950    x 6 units  . Hard of hearing   . Cancer      BREAST- right and left/ s/p radiation, tamifoxen    PAST SURGICAL HISTORY: Past Surgical History  Procedure Laterality Date  . Abdominal hysterectomy  1969  . Back surgery  1959  . Breast surgery  1995    (R) Lumpectomy  . Mastectomy, partial  2005    left  . Eye surgery      bilateral cataract extraction with IOL,   repair left tear duct  . Colon surgery      resection for carcinoid tumor  . Cholecystectomy  03/25/2012    Procedure: LAPAROSCOPIC CHOLECYSTECTOMY WITH INTRAOPERATIVE CHOLANGIOGRAM;   Surgeon: Mariella Saa, MD;  Location: [facility];  Service: General;  Laterality: N/A;    FAMILY HISTORY Family History  Problem Relation Age of Onset  . Breast cancer Mother   . Hyperlipidemia Mother   . Hypertension Mother   . Colon cancer Mother     ?  . Prostate cancer Father   . Breast cancer Sister     x'2  . Breast cancer Daughter   . Colon polyps Daughter   . Colon polyps Son   . Colon cancer Father   The patient's father died from colon cancer at the age of 25, the patient's mother died at the age of 72 possibly also from colon cancer.  The patient has one brother who is alive, status post CABG.  She has one sister who died from  non cancer related causes although she had a diagnosis of breast cancer in her 58s and a sister who is alive a 28 who also had a diagnosis of breast cancer in her 25s.  GYNECOLOGIC HISTORY:  She is Gx, P4.  She never had hormone replacement therapy.  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  She has been a widow since 2007.  She lives by herself. She has 4 children, 3 of whom live nearby, and they look in on her pretty much daily. She also has a sitter who stays with her every morning until after lunch.   ADVANCED DIRECTIVES: In place  HEALTH MAINTENANCE: History  Substance Use Topics  . Smoking status: Never Smoker   . Smokeless tobacco: Never Used  . Alcohol Use: No     Colonoscopy:  PAP:  Bone density:  Lipid panel:  No Known Allergies  Current Outpatient Prescriptions  Medication Sig Dispense Refill  . amLODipine (NORVASC) 5 MG tablet Take 5 mg by mouth daily after breakfast.       . anastrozole (ARIMIDEX) 1 MG tablet Take 1 mg by mouth daily after breakfast.       . atorvastatin (LIPITOR) 20 MG tablet Take 1 tablet by mouth  daily  90 tablet  1  . Calcium-Vitamin D-Iron (CALCIUM 600 IRON/D) 161-096-04 MG-UNIT-MG TABS Take 1 capsule by mouth daily.       Marland Kitchen dicyclomine (BENTYL) 10 MG capsule Take 1  capsule (10 mg total) by mouth at bedtime.  30 capsule  2  . diphenoxylate-atropine (LOMOTIL) 2.5-0.025 MG per tablet TAKE 1 TABLET BY MOUTH FOUR TIMES DAILY AS NEEDED FOR DIARRHEA/LOOSE STOOL  30 tablet  0  . levothyroxine (SYNTHROID, LEVOTHROID) 50 MCG tablet TAKE 1 TABLET BY MOUTH EVERY DAY  30 tablet  5  . metoprolol tartrate (LOPRESSOR) 25 MG tablet       . Multiple Vitamins-Minerals (CENTRUM SILVER) tablet Take 1 tablet by mouth daily.       . nortriptyline (PAMELOR) 10 MG capsule Take 1 capsule (10 mg total) by mouth at bedtime.  30 capsule  1  . omeprazole (PRILOSEC) 40 MG capsule TAKE 1 CAPSULE BY MOUTH DAILY  90 capsule  1  . ondansetron (ZOFRAN) 4 MG tablet Take 1 tablet (4 mg total) by mouth every 8 (eight) hours as needed for nausea.  20 tablet  0   No current facility-administered medications for this visit.    OBJECTIVE: Elderly white woman who appears stated age 47 Vitals:   12/08/12 1030  BP: 130/81  Pulse: 78  Temp: 98.4 F (36.9 C)  Resp: 20     Body mass index is 23.81 kg/(m^2).    ECOG FS: 2 Filed Weights   12/08/12 1030  Weight: 138 lb 12.8 oz (62.959 kg)   Sclerae unicteric, pupils round and reactive to light Oropharynx shows no thrush or lesions No cervical or supraclavicular adenopathy Lungs no rales or rhonchi, fair excursion bilaterally Heart regular rate and rhythm, no murmur appreciated Abd soft, nontender to palpation, with positive bowel sounds  MSK no focal spinal tenderness, mild kyphosis noted on exam No upper extremity lymphedema Neuro: nonfocal, very hard of hearing but alert, positive affect Breasts: There is an area of thickening in the right breast measuring perhaps 3-4 cm, but without discrete margins. There is no skin or nipple change. The right axilla is benign. Left breast is status post lumpectomy with no suspicious nodularities. The  left axilla is also clear  LAB RESULTS: Lab Results  Component Value Date   WBC 6.8 12/08/2012    NEUTROABS 4.2 12/08/2012   HGB 13.2 12/08/2012   HCT 39.6 12/08/2012   MCV 92.4 12/08/2012   PLT 150 12/08/2012      Chemistry      Component Value Date/Time   NA 137 06/05/2012 1106   NA 135* 12/23/2011 1020   K 3.7 06/05/2012 1106   K 3.5 12/23/2011 1020   CL 103 06/05/2012 1106   CL 101 12/23/2011 1020   CO2 25 06/05/2012 1106   CO2 24 12/23/2011 1020   BUN 11 10/13/2012 1205   BUN 14.0 12/23/2011 1020   CREATININE 0.9 10/13/2012 1205   CREATININE 0.8 12/23/2011 1020      Component Value Date/Time   CALCIUM 9.1 06/05/2012 1106   CALCIUM 9.4 12/23/2011 1020   ALKPHOS 79 06/05/2012 1106   ALKPHOS 95 12/23/2011 1020   AST 21 06/05/2012 1106   AST 17 12/23/2011 1020   ALT 17 06/05/2012 1106   ALT 15 12/23/2011 1020   BILITOT 1.2 06/05/2012 1106   BILITOT 0.70 12/23/2011 1020       Lab Results  Component Value Date   LABCA2 19 01/05/2009    STUDIES:  CT ABDOMEN AND PELVIS WITH CONTRAST  10/13/2012 Technique: Multidetector CT imaging of the abdomen and pelvis was  performed following the standard protocol during bolus  administration of intravenous contrast.  Contrast: 80mL OMNIPAQUE IOHEXOL 300 MG/ML SOLN  Comparison: 12/04/2011  Findings: The lung bases are clear. No pleural or pericardial  effusion identified.  Status post cholecystectomy. Mild increased caliber of the  intrahepatic bile ducts. The common bile duct measures 7 mm, image  53/series 602. No obstructing stone or mass noted. No focal  pancreas abnormality identified. There is a focal area of low  attenuation involving the gallbladder measuring up to 5 mm, image  14/series 2.  The adrenal glands are both normal. Normal appearance of the  kidneys. The urinary bladder appears normal. Previous  hysterectomy.  There is calcified atherosclerotic disease affecting the abdominal  aorta. No aneurysm.  There is no upper abdominal adenopathy. No pelvic or inguinal  adenopathy. There is a small hiatal hernia noted. Status post   right hemicolectomy. End to side enterocolonic anastomoses is  identified, image 45/series 2.  No free fluid or abnormal fluid collections within the upper  abdomen or pelvis. No evidence for peritoneal disease.  The bones appear osteopenic. No aggressive lytic or sclerotic bone  lesions identified.  IMPRESSION:  1. No specific features identified to suggest residual or  recurrence of carcinoid tumor.  2. Increase in caliber of the intrahepatic bile ducts status post  cholecystectomy. No obstructing stone or mass noted.  Original Report Authenticated By: Signa Kell, M.D.      ASSESSMENT: 79 y.o.  [employer] woman with a history of   (1) remote right lumpectomy and radiation   (2) left lumpectomy and sentinel lymph node biopsy February of 2007 for a T1c NX Stage I invasive ductal carcinoma, grade 1, estrogen and progesterone receptor positive, HER-2 negative, treated initially with anastrozole with good tolerance, switched to tamoxifen June of 2008 for financial reasons   (3) another right-sided breast cancer biopsied March of 2012, measuring 1.1 cm by ultrasound, grade 3, with a clinically negative axilla, strongly estrogen and progesterone receptor positive, no evidence of HER-2 amplification, and MIB-1 of 21%; no definitive surgery, instead the patient was  switched back to anastrozole, on which she continues   (4) small bowel mass status post resection in June of 2012 proving to be a T3 N1, stage III well-differentiated carcinoid tumor.   PLAN: Madaleine is tolerating the anastrozole without any obvious side effects. The plan is to continue this indefinitely. Katelyn [name] understands her mother's prognosis is always going to be about 6 months. Common causes of death in dysphagia or falls, diarrhea, and pneumonia. The chance of Earnestine dying from her breast cancer are close to 0.  The plan accordingly he is to continue anastrozole, and repeat mammography this coming March, with a visit  shortly afterwards. If there is evidence of her cancer growing we will change her to exemestane. Katelyn [name] tells me that the patient has a living will in place.   They know to call for any problems that may develop before the next visit here.   Maimouna Rondeau C    12/08/2012

## 2012-12-11 ENCOUNTER — Telehealth: Payer: Self-pay | Admitting: Oncology

## 2012-12-11 NOTE — Telephone Encounter (Signed)
, °

## 2013-01-15 ENCOUNTER — Telehealth: Payer: Self-pay | Admitting: Internal Medicine

## 2013-01-15 NOTE — Telephone Encounter (Signed)
Called pt daughter (patricia) no answer LMOM will need ov. Pt will need to get a TB skin test as well...lmb

## 2013-01-15 NOTE — Telephone Encounter (Signed)
01/15/2013  Daughter of pt left message stating that they are going to be putting pt in assisted living and they have forms to be filled out.  Daughter wants to know if the pt needs to come in or can they just drop forms off.  Please advise.  Thanks.

## 2013-01-18 ENCOUNTER — Encounter: Payer: Self-pay | Admitting: Internal Medicine

## 2013-01-18 ENCOUNTER — Ambulatory Visit (INDEPENDENT_AMBULATORY_CARE_PROVIDER_SITE_OTHER): Payer: MEDICARE | Admitting: Internal Medicine

## 2013-01-18 VITALS — BP 130/72 | HR 75 | Temp 97.7°F | Wt 142.0 lb

## 2013-01-18 DIAGNOSIS — I10 Essential (primary) hypertension: Secondary | ICD-10-CM

## 2013-01-18 DIAGNOSIS — F411 Generalized anxiety disorder: Secondary | ICD-10-CM

## 2013-01-18 DIAGNOSIS — E039 Hypothyroidism, unspecified: Secondary | ICD-10-CM

## 2013-01-18 DIAGNOSIS — C50219 Malignant neoplasm of upper-inner quadrant of unspecified female breast: Secondary | ICD-10-CM

## 2013-01-18 DIAGNOSIS — C50211 Malignant neoplasm of upper-inner quadrant of right female breast: Secondary | ICD-10-CM

## 2013-01-18 DIAGNOSIS — R109 Unspecified abdominal pain: Secondary | ICD-10-CM

## 2013-01-18 DIAGNOSIS — R1030 Lower abdominal pain, unspecified: Secondary | ICD-10-CM

## 2013-01-18 NOTE — Assessment & Plan Note (Signed)
(  1) remote right lumpectomy and radiation  (2) left lumpectomy and sentinel lymph node biopsy February of 2007 for a T1c NX Stage I invasive ductal carcinoma, grade 1, estrogen and progesterone receptor positive, HER-2 negative, treated initially with anastrozole with good tolerance, switched to tamoxifen June of 2008 for financial reasons  (3) another right-sided breast cancer biopsied March of 2012, measuring 1.1 cm by ultrasound, grade 3, with a clinically negative axilla, strongly estrogen and progesterone receptor positive, no evidence of HER-2 amplification, and MIB-1 of 21%; no definitive surgery, instead the patient was switched back to anastrozole, on which she continues   Interval hx reviewed (above from onc note 11/2012) The current medical regimen is effective;  continue present plan and medications.

## 2013-01-18 NOTE — Assessment & Plan Note (Signed)
chronic "urinary problems": chronic suprapubic pain and burning -  cramping pain that causes urge sensation and awakes from sleep every day 4AM associated with frequency and small volume/no volume voiding Uro eval 10/2012 for same - no retention or GU issue GI eval - no GI issue, CT summer 2014 ok Also s/p chole without change in symptoms  Improved with Pamelor as rx'd 11/2012

## 2013-01-18 NOTE — Progress Notes (Signed)
Pre-visit discussion using our clinic review tool. No additional management support is needed unless otherwise documented below in the visit note.  

## 2013-01-18 NOTE — Assessment & Plan Note (Signed)
Previously folllowed with endo (kumar) but now follows here Check TSH q6-62mo, titrate as needed  Lab Results  Component Value Date   TSH 4.28 06/05/2012

## 2013-01-18 NOTE — Patient Instructions (Signed)
It was good to see you today.  We have reviewed your prior records including labs and tests today  Medications reviewed and updated - no changes recommended today.   continue nortriptyline at bedtime for bladder spasm pain and sleep aid   Paperwork completed today as requested - please let me know if there are other forms I can assist you with  Followup in 6 months, call sooner if problems

## 2013-01-18 NOTE — Assessment & Plan Note (Signed)
Continue pamelor qhs and prn xanax symptoms stable/improved May improve with socialization of ALF environment

## 2013-01-18 NOTE — Progress Notes (Signed)
  Subjective:    Patient ID: Katelyn [name], female    DOB: Dec 01, 1918, 79 y.o.   MRN: [id number]  HPI Here for follow up -  reviewed chronic medical issues and interval medical events Daughter Elease Hashimoto, not here today - different dtr at side, has dropped off paperwork and FL2 to be signed for placement in assisted living -  Past Medical History  Diagnosis Date  . Hypertension   . Hypothyroidism   . Hx of colonic polyps     RLQ mensenteric mass-1.4cmx2.1cm CT 02/20/10 ? Carcinoid  . Hyperlipidemia   . Depression Aug 16, 1999    following death of spouse  . Hiatal hernia   . Clostridium difficile colitis   . Anxiety   . Anemia   . Parkinson disease   . Hx of adenomatous colonic polyps 06/25/01  . GERD (gastroesophageal reflux disease)   . Carcinoid tumor, malignant     09/03/10 resection; 2/9 positive LN  . Atrial fibrillation     peri/post op 08/2010 - resumed NSR  . Cholelithiasis 2011-08-16  . History of blood transfusion 1950    x 6 units  . Hard of hearing   . Cancer      BREAST- right and left/ s/p radiation, tamifoxen    Review of Systems  Constitutional: Negative for fever and unexpected weight change.  Respiratory: Negative for cough and shortness of breath.   Cardiovascular: Negative for chest pain.  Genitourinary: Negative for dysuria, vaginal bleeding and difficulty urinating.       Objective:   Physical Exam BP 130/72  Pulse 75  Temp(Src) 97.7 F (36.5 C) (Oral)  Wt 142 lb (64.411 kg)  SpO2 94% Wt Readings from Last 3 Encounters:  01/18/13 142 lb (64.411 kg)  12/08/12 138 lb 12.8 oz (62.959 kg)  11/26/12 138 lb (62.596 kg)   Constitutional: She appears well-developed and well-nourished. No distress. Dtr at side HENT: Very HOH!  Cardiovascular: Normal rate, regular rhythm and normal heart sounds.  No murmur heard. No BLE edema. Pulmonary/Chest: Effort normal and breath sounds normal. No respiratory distress. She has no wheezes.  Abdominal: Soft. Bowel sounds are  normal. She exhibits no distension. There is no suprapubic tenderness.  Psychiatric: She has an anxious mood and affect. Her behavior is normal. Judgment and thought content normal.       Lab Results  Component Value Date   WBC 6.8 12/08/2012   HGB 13.2 12/08/2012   HCT 39.6 12/08/2012   PLT 150 12/08/2012   CHOL 155 06/06/2010   TRIG 114.0 06/06/2010   HDL 69.70 06/06/2010   ALT 16 12/08/2012   AST 18 12/08/2012   NA 133* 12/08/2012   K 3.5 12/08/2012   CL 103 06/05/2012   CREATININE 0.9 12/08/2012   BUN 11.0 12/08/2012   CO2 24 12/08/2012   TSH 4.28 06/05/2012   HGBA1C 5.9 04/28/2009     Assessment & Plan:   See problem list. Medications and labs reviewed today.  FL2 signed for ALF - faxed and original given to dtr today Will complete facility specific form including PPD at future date once facility selected  Time spent with pt/family today 25 minutes, greater than 50% time spent counseling patient on bladder pain symptoms, plans for move to ALF and medication review. Also review of prior records

## 2013-01-18 NOTE — Assessment & Plan Note (Signed)
BP Readings from Last 3 Encounters:  01/18/13 130/72  12/08/12 130/81  11/26/12 120/72   At goal, meds/labs reviewed - cont same

## 2013-01-19 ENCOUNTER — Other Ambulatory Visit: Payer: Self-pay | Admitting: Internal Medicine

## 2013-01-25 ENCOUNTER — Encounter: Payer: Self-pay | Admitting: Podiatry

## 2013-01-25 ENCOUNTER — Ambulatory Visit (INDEPENDENT_AMBULATORY_CARE_PROVIDER_SITE_OTHER): Payer: MEDICARE | Admitting: Podiatry

## 2013-01-25 VITALS — BP 159/79 | HR 106 | Resp 32 | Ht 64.0 in | Wt 139.0 lb

## 2013-01-25 DIAGNOSIS — B351 Tinea unguium: Secondary | ICD-10-CM

## 2013-01-25 DIAGNOSIS — M79609 Pain in unspecified limb: Secondary | ICD-10-CM

## 2013-01-25 NOTE — Progress Notes (Signed)
Patient ID: Katelyn [name], female   DOB: 11-05-1918, 79 y.o.   MRN: [id number]  Subjective: Patient presents complaining of painful mycotic toenails. Usual intervals approximately 3 months.  Objective: Hard of hearing orientated x3 white female. Hypertrophic, elongated, with texture and color changes x10. The nails are tender to pressure.  Assessment: Symptomatic onychomycoses x10  Plan: Debridement of all 10 toenails without any bleeding. Reappoint at three-month intervals. Please note patient says that she may transition into assisted living facility in the near future.  Katelyn [name] Katelyn [name], DPM

## 2013-01-30 ENCOUNTER — Other Ambulatory Visit: Payer: Self-pay | Admitting: Internal Medicine

## 2013-03-23 ENCOUNTER — Other Ambulatory Visit: Payer: Self-pay | Admitting: Internal Medicine

## 2013-03-25 ENCOUNTER — Other Ambulatory Visit: Payer: Self-pay | Admitting: Oncology

## 2013-04-12 ENCOUNTER — Other Ambulatory Visit: Payer: Self-pay | Admitting: *Deleted

## 2013-04-12 ENCOUNTER — Other Ambulatory Visit: Payer: Self-pay | Admitting: Internal Medicine

## 2013-04-12 MED ORDER — NORTRIPTYLINE HCL 10 MG PO CAPS: 10.0000 mg | ORAL_CAPSULE | Freq: Every day | ORAL | Status: DC

## 2013-04-12 MED ORDER — LEVOTHYROXINE SODIUM 50 MCG PO TABS: ORAL_TABLET | ORAL | Status: DC

## 2013-04-26 ENCOUNTER — Ambulatory Visit: Payer: MEDICARE | Admitting: Podiatry

## 2013-04-28 ENCOUNTER — Other Ambulatory Visit: Payer: Self-pay | Admitting: Internal Medicine

## 2013-04-28 ENCOUNTER — Other Ambulatory Visit: Payer: Self-pay | Admitting: *Deleted

## 2013-04-28 MED ORDER — OMEPRAZOLE 40 MG PO CPDR: DELAYED_RELEASE_CAPSULE | ORAL | Status: DC

## 2013-05-03 ENCOUNTER — Ambulatory Visit: Payer: MEDICARE | Admitting: Podiatry

## 2013-05-22 ENCOUNTER — Other Ambulatory Visit: Payer: Self-pay | Admitting: Internal Medicine

## 2013-05-25 ENCOUNTER — Ambulatory Visit: Payer: MEDICARE | Admitting: Physician Assistant

## 2013-05-25 ENCOUNTER — Other Ambulatory Visit: Payer: MEDICARE

## 2013-05-25 ENCOUNTER — Other Ambulatory Visit: Payer: Self-pay | Admitting: Physician Assistant

## 2013-05-25 DIAGNOSIS — C50919 Malignant neoplasm of unspecified site of unspecified female breast: Secondary | ICD-10-CM

## 2013-05-25 NOTE — Progress Notes (Signed)
Per message from patient's daughter on 05/24/2013, she would like to cancel appts for 05/25/2013 and reschedule for "late spring".  POF generated to schedule appt in late May with GM or AB.  Micah Flesher, PA-C 05/25/2013

## 2013-05-26 ENCOUNTER — Telehealth: Payer: Self-pay | Admitting: Oncology

## 2013-05-26 NOTE — Telephone Encounter (Signed)
, °

## 2013-06-18 ENCOUNTER — Ambulatory Visit
Admission: RE | Admit: 2013-06-18 | Discharge: 2013-06-18 | Disposition: A | Discharge status: Home / Self Care | Payer: MEDICARE | Source: Ambulatory Visit | Attending: Oncology | Admitting: Oncology

## 2013-06-18 ENCOUNTER — Other Ambulatory Visit: Payer: Self-pay | Admitting: Oncology

## 2013-06-18 DIAGNOSIS — C50211 Malignant neoplasm of upper-inner quadrant of right female breast: Secondary | ICD-10-CM

## 2013-06-18 DIAGNOSIS — C50512 Malignant neoplasm of lower-outer quadrant of left female breast: Secondary | ICD-10-CM

## 2013-06-25 ENCOUNTER — Other Ambulatory Visit: Payer: Self-pay | Admitting: Oncology

## 2013-06-25 DIAGNOSIS — C50219 Malignant neoplasm of upper-inner quadrant of unspecified female breast: Secondary | ICD-10-CM

## 2013-07-02 ENCOUNTER — Other Ambulatory Visit: Payer: Self-pay | Admitting: Internal Medicine

## 2013-08-03 ENCOUNTER — Ambulatory Visit: Payer: MEDICARE | Admitting: Oncology

## 2013-08-03 ENCOUNTER — Other Ambulatory Visit: Payer: MEDICARE

## 2013-08-16 ENCOUNTER — Telehealth: Payer: Self-pay | Admitting: *Deleted

## 2013-08-16 NOTE — Telephone Encounter (Signed)
In addition to daily prilosec, pt should try taking TUMS at bedtime If still unimproved, please let me know for further eval as needed thanks

## 2013-08-16 NOTE — Telephone Encounter (Signed)
[name] called states pt has had a lot of indigestion at night.  Please advise

## 2013-08-16 NOTE — Telephone Encounter (Signed)
Spoke with Linda advised of MDs message. 

## 2013-09-09 ENCOUNTER — Other Ambulatory Visit: Payer: Self-pay | Admitting: Internal Medicine

## 2013-09-22 ENCOUNTER — Encounter: Payer: Self-pay | Admitting: Podiatry

## 2013-09-22 ENCOUNTER — Ambulatory Visit (INDEPENDENT_AMBULATORY_CARE_PROVIDER_SITE_OTHER): Payer: MEDICARE | Admitting: Podiatry

## 2013-09-22 VITALS — BP 142/69 | HR 86 | Resp 12

## 2013-09-22 DIAGNOSIS — B351 Tinea unguium: Secondary | ICD-10-CM

## 2013-09-22 DIAGNOSIS — M79673 Pain in unspecified foot: Secondary | ICD-10-CM

## 2013-09-22 DIAGNOSIS — M79609 Pain in unspecified limb: Secondary | ICD-10-CM

## 2013-09-22 NOTE — Progress Notes (Signed)
Patient ID: Katelyn [name], female   DOB: 08-14-1918, 80 y.o.   MRN: [id number]  Subjective: This patient presents complaining of painful toenails and walking wearing shoes The last visit for similar debridement was on 01/25/2013  Objective: Orientated x3 white female Elongated, hypertrophic, incurvated toenails, discolored, brittle toenails 6-10  Assessment: Symptomatic onychomycoses 6-10  Plan: Debridement of toenails 6-10 without a bleeding  Reappoint  at patient's request

## 2013-09-27 ENCOUNTER — Other Ambulatory Visit: Payer: Self-pay | Admitting: Oncology

## 2013-09-27 DIAGNOSIS — C50211 Malignant neoplasm of upper-inner quadrant of right female breast: Secondary | ICD-10-CM

## 2013-09-27 DIAGNOSIS — C50512 Malignant neoplasm of lower-outer quadrant of left female breast: Secondary | ICD-10-CM

## 2013-11-08 ENCOUNTER — Other Ambulatory Visit: Payer: Self-pay | Admitting: Internal Medicine

## 2014-01-10 ENCOUNTER — Other Ambulatory Visit: Payer: Self-pay | Admitting: *Deleted

## 2014-01-10 DIAGNOSIS — C50211 Malignant neoplasm of upper-inner quadrant of right female breast: Secondary | ICD-10-CM

## 2014-01-10 DIAGNOSIS — C50919 Malignant neoplasm of unspecified site of unspecified female breast: Secondary | ICD-10-CM

## 2014-01-11 ENCOUNTER — Telehealth: Payer: Self-pay | Admitting: Oncology

## 2014-01-11 ENCOUNTER — Other Ambulatory Visit (HOSPITAL_BASED_OUTPATIENT_CLINIC_OR_DEPARTMENT_OTHER): Payer: MEDICARE

## 2014-01-11 ENCOUNTER — Ambulatory Visit (HOSPITAL_BASED_OUTPATIENT_CLINIC_OR_DEPARTMENT_OTHER): Payer: MEDICARE | Admitting: Oncology

## 2014-01-11 VITALS — BP 152/64 | HR 90 | Temp 98.6°F | Resp 18 | Ht 64.0 in | Wt 152.2 lb

## 2014-01-11 DIAGNOSIS — C50211 Malignant neoplasm of upper-inner quadrant of right female breast: Secondary | ICD-10-CM

## 2014-01-11 DIAGNOSIS — C50912 Malignant neoplasm of unspecified site of left female breast: Secondary | ICD-10-CM

## 2014-01-11 DIAGNOSIS — C50911 Malignant neoplasm of unspecified site of right female breast: Secondary | ICD-10-CM | POA: Diagnosis not present

## 2014-01-11 DIAGNOSIS — C50512 Malignant neoplasm of lower-outer quadrant of left female breast: Secondary | ICD-10-CM

## 2014-01-11 DIAGNOSIS — Z170 Estrogen receptor positive status [ER+]: Secondary | ICD-10-CM

## 2014-01-11 DIAGNOSIS — C50919 Malignant neoplasm of unspecified site of unspecified female breast: Secondary | ICD-10-CM

## 2014-01-11 DIAGNOSIS — Z85060 Personal history of malignant carcinoid tumor of small intestine: Secondary | ICD-10-CM

## 2014-01-11 LAB — CBC WITH DIFFERENTIAL/PLATELET
BASO%: 0.3 % (ref 0.0–2.0)
Basophils Absolute: 0 10*3/uL (ref 0.0–0.1)
EOS%: 0.3 % (ref 0.0–7.0)
Eosinophils Absolute: 0 10*3/uL (ref 0.0–0.5)
HCT: 40.9 % (ref 34.8–46.6)
HGB: 13.3 g/dL (ref 11.6–15.9)
LYMPH%: 25 % (ref 14.0–49.7)
MCH: 28.8 pg (ref 25.1–34.0)
MCHC: 32.5 g/dL (ref 31.5–36.0)
MCV: 88.5 fL (ref 79.5–101.0)
MONO#: 1.3 10*3/uL — AB (ref 0.1–0.9)
MONO%: 18.7 % — ABNORMAL HIGH (ref 0.0–14.0)
NEUT#: 3.9 10*3/uL (ref 1.5–6.5)
NEUT%: 55.7 % (ref 38.4–76.8)
NRBC: 0 % (ref 0–0)
PLATELETS: 154 10*3/uL (ref 145–400)
RBC: 4.62 10*6/uL (ref 3.70–5.45)
RDW: 15 % — AB (ref 11.2–14.5)
WBC: 7.1 10*3/uL (ref 3.9–10.3)
lymph#: 1.8 10*3/uL (ref 0.9–3.3)

## 2014-01-11 LAB — COMPREHENSIVE METABOLIC PANEL (CC13)
ALT: 37 U/L (ref 0–55)
ANION GAP: 12 meq/L — AB (ref 3–11)
AST: 26 U/L (ref 5–34)
Albumin: 3.3 g/dL — ABNORMAL LOW (ref 3.5–5.0)
Alkaline Phosphatase: 147 U/L (ref 40–150)
BUN: 19.7 mg/dL (ref 7.0–26.0)
CALCIUM: 9.8 mg/dL (ref 8.4–10.4)
CO2: 23 meq/L (ref 22–29)
Chloride: 106 mEq/L (ref 98–109)
Creatinine: 1.1 mg/dL (ref 0.6–1.1)
Glucose: 104 mg/dl (ref 70–140)
Potassium: 3.9 mEq/L (ref 3.5–5.1)
SODIUM: 141 meq/L (ref 136–145)
TOTAL PROTEIN: 7.5 g/dL (ref 6.4–8.3)
Total Bilirubin: 0.48 mg/dL (ref 0.20–1.20)

## 2014-01-11 LAB — TECHNOLOGIST REVIEW

## 2014-01-11 NOTE — Progress Notes (Signed)
ID: Katelyn [name]   DOB: 29-Jul-1918  MR#: [id number]  NOB#:[id number]  PCP: Gwendolyn Grant, MD GYN:  SU:  OTHER MD:  CHIEF COMPLAINT: Bilateral breast cancer; carcinoid tumor  CURRENT THERAPY: Anastrozole   HISTORY OF PRESENT ILLNESS: For the most recent summary:  Ms. Onofre had a screening mammogram on April 03, 2005, which showed questionable distortion in the left breast.  She was brought for additional views on January 24.  By physical exam Dr. Ulyess Blossom was able to palpate a little bit of thickening in the outer portion of the left breast.  By ultrasound this area was spiculated and solid with posterior shadowing and measured up to 1.3 cm.  Accordingly core biopsy was obtained the same day and showed ([id number] and [phone]) an invasive breast cancer, which was strongly ER positive, PR positive at 37% with a moderate proliferation marker at 17% and HER-2/neu at 2+.  FISH, however, showed low-level polysomy with a ratio of only 1.1 and so essentially negative.  With this information the patient was referred to Dr. Rebekah Chesterfield and on April 26, 2005 bilateral breast MRI was obtained.  This showed only the solitary mass in the upper aspect of the left breast, measuring by MRI up to 1.4 cm.  Accordingly after appropriate discussion Dr. Rebekah Chesterfield proceeded to left lumpectomy and lymph node sampling May 08, 2005.  This showed ([id number]) a 1.5 cm infiltrating ductal carcinoma, which was grade 1.  Margins were negative.  There was no evidence of lymphovascular invasion and 0 of 4 sentinel lymph nodes were involved.  She was treated with anastrozole initially, then switched to tamoxifen as of 2008 for financial reasons.  In 2012, the patient had routine bilateral mammography May 14, 2010 which showed  left postsurgical scarring; however, on the right there was a new oval mass, not associated with any calcifications.  Dr Melanee Spry was not able to palpate an abnormality in that area, but ultrasound  confirmed a 1.1 cm lobulated hypoechoic mass at the 1 o'clock position in the right breast, 6 cm from the nipple.  He did not find any evidence of axillary adenopathy.  This was felt sufficiently suspicious to warrant biopsy and this was performed March 7, with the pathology ([id number]) showing a grade 3 invasive ductal carcinoma which was estrogen receptor positive at 96%, progesterone receptor poor at 2%, with an MIB-1 of 21% and no evidence of amplification of HER-2 by CISH, with a ratio of 0.9.   The patient did not undergo definitive surgery, but chose to be treated neoadjuvant leg. She was switched back to anastrozole in March of 2012 on which she continues.   At the same time that the patient was being worked up for this, she had been complaining of abdominal discomfort and on February 20, 2010, a CT of the abdomen and pelvis was obtained through the emergency room showing, in addition to a small hiatal hernia and no evidence of liver involvement, a spiculated mass in the right lower quadrant mesentery.  This mass had been previously noted (December 2006) but had increased in size, now measuring 2.1 cm, versus 1.3 cm previously.  The possibility of a carcinoid tumor or a low-grade lymphoma was suggested.    The patient was referred to Dr Olevia Perches, who evaluated the patient May 07, 2010, for problems with diarrhea and abdominal pain.  There is, of course, a strong family history of colon cancer and the patient also has a prior history of C. difficile colitis.  Dr  Olevia Perches felt that a capsule endoscopy was the best test to obtain and this was performed on May 15, 2010.  This study showed a mass protruding into the lumen of the small bowel, occupying one third of the lumen, with a second mass in a separate segment.  The capsule was held at that segment for some time before passing into the cecum.  She is status post small bowel mass resection in June 2012 proving to be T3 N1, stage III  well-differentiated carcinoid tumor.  Subsequent history is as detailed below.   INTERVAL HISTORY: Katelyn [name] returns today for followup of her breast cancer, accompanied by her daughter [name]. [name] is now 8 years out from her own breast cancer). Katelyn [name] is now living at [facility]. She kind a like the food but there is "too much". She enjoys the vegetables. She is tolerating the anastrozole with no side effects that she is aware  REVIEW OF SYSTEMS: Katelyn [name] feels tired all the time. This is severe. She has problems with her hearing. Her hearing aid Works okay. She can be short of breath when walking any distance and certainly when going up any steps. Occasionally she has loose bowel movements but seldom more than once a day. She bruises easily. She is forgetful. Sometimes she gets depressed. She denies flushing or palpitations. Aside from these issues a detailed review of systems today was stable  PAST MEDICAL HISTORY: Past Medical History  Diagnosis Date  . Hypertension   . Hypothyroidism   . Hx of colonic polyps     RLQ mensenteric mass-1.4cmx2.1cm CT 02/20/10 ? Carcinoid  . Hyperlipidemia   . Depression Jun 01, 1999    following death of spouse  . Hiatal hernia   . Clostridium difficile colitis   . Anxiety   . Anemia   . Parkinson disease   . Hx of adenomatous colonic polyps 06/25/01  . GERD (gastroesophageal reflux disease)   . Carcinoid tumor, malignant     09/03/10 resection; 2/9 positive LN  . Atrial fibrillation     peri/post op 08/2010 - resumed NSR  . Cholelithiasis June 01, 2011  . History of blood transfusion 1950    x 6 units  . Hard of hearing   . Cancer      BREAST- right and left/ s/p radiation, tamifoxen    PAST SURGICAL HISTORY: Past Surgical History  Procedure Laterality Date  . Abdominal hysterectomy  1969  . Back surgery  1959  . Breast surgery  1995    (R) Lumpectomy  . Mastectomy, partial  06/01/03    left  . Eye surgery      bilateral cataract extraction with IOL,    repair left tear duct  . Colon surgery      resection for carcinoid tumor  . Cholecystectomy  03/25/2012    Procedure: LAPAROSCOPIC CHOLECYSTECTOMY WITH INTRAOPERATIVE CHOLANGIOGRAM;  Surgeon: Edward Jolly, MD;  Location: [facility];  Service: General;  Laterality: N/A;    FAMILY HISTORY Family History  Problem Relation Age of Onset  . Breast cancer Mother   . Hyperlipidemia Mother   . Hypertension Mother   . Colon cancer Mother     ?  . Prostate cancer Father   . Breast cancer Sister     x'2  . Breast cancer Daughter   . Colon polyps Daughter   . Colon polyps Son   . Colon cancer Father   The patient's father died from colon cancer at the age of 77, the patient's mother died  at the age of 67 possibly also from colon cancer.  The patient has one brother who is alive, status post CABG.  She has one sister who died from non cancer related causes although she had a diagnosis of breast cancer in her 62s and a sister who is alive a 43 who also had a diagnosis of breast cancer in her 31s.  GYNECOLOGIC HISTORY:  She is Gx, P4.  She never had hormone replacement therapy.  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  She has been a widow since 2007.  She lives by herself. She has 4 children, 3 of whom live nearby, and they look in on her pretty much daily. She also has a sitter who stays with her every morning until after lunch.   ADVANCED DIRECTIVES: In place  HEALTH MAINTENANCE: History  Substance Use Topics  . Smoking status: Never Smoker   . Smokeless tobacco: Never Used  . Alcohol Use: No     Colonoscopy:  PAP:  Bone density:  Lipid panel:  No Known Allergies  Current Outpatient Prescriptions  Medication Sig Dispense Refill  . amLODipine (NORVASC) 5 MG tablet Take 1 tablet by mouth  daily  90 tablet  1  . anastrozole (ARIMIDEX) 1 MG tablet TAKE 1 TABLET BY MOUTH EVERY DAY  90 tablet  1  . atorvastatin (LIPITOR) 20 MG tablet Take 1 tablet by mouth   every day  90 tablet  1  . Calcium-Vitamin D-Iron (CALCIUM 600 IRON/D) 024-097-35 MG-UNIT-MG TABS Take 1 capsule by mouth daily.       Marland Kitchen dicyclomine (BENTYL) 10 MG capsule Take 1 capsule (10 mg total) by mouth at bedtime.  30 capsule  2  . diphenoxylate-atropine (LOMOTIL) 2.5-0.025 MG per tablet TAKE 1 TABLET BY MOUTH FOUR TIMES DAILY AS NEEDED FOR DIARRHEA/LOOSE STOOL  30 tablet  0  . levothyroxine (SYNTHROID, LEVOTHROID) 50 MCG tablet TAKE 1 TABLET BY MOUTH EVERY DAY  90 tablet  1  . metoprolol tartrate (LOPRESSOR) 25 MG tablet Take 1 tablet by mouth two  times daily  90 tablet  3  . metroNIDAZOLE (FLAGYL) 500 MG tablet Take 500 mg by mouth 3 (three) times daily. 1/2 tablet bid      . Multiple Vitamins-Minerals (CENTRUM SILVER) tablet Take 1 tablet by mouth daily.       . nortriptyline (PAMELOR) 10 MG capsule Take 1 capsule by mouth at  bedtime  90 capsule  3  . omeprazole (PRILOSEC) 40 MG capsule Take 1 capsule by mouth  daily  90 capsule  1  . ondansetron (ZOFRAN) 4 MG tablet Take 1 tablet (4 mg total) by mouth every 8 (eight) hours as needed for nausea.  20 tablet  0  . simvastatin (ZOCOR) 40 MG tablet Take 40 mg by mouth daily.      . tamoxifen (NOLVADEX) 20 MG tablet Take 20 mg by mouth daily.       No current facility-administered medications for this visit.    OBJECTIVE: Elderly white woman who appears stated age 34 Vitals:   01/11/14 1521  BP: 152/64  Pulse: 90  Temp: 98.6 F (37 C)  Resp: 18     Body mass index is 26.11 kg/(m^2).    ECOG FS: 2 Filed Weights   01/11/14 1521  Weight: 152 lb 3.2 oz (69.037 kg)   Sclerae unicteric, EOMs intact Oropharynx shows no thrush or other lesions No cervical or supraclavicular adenopathy Lungs  no rales or rhonchi Heart regular rate and rhythm Abd soft, nontender, with positive bowel sounds  MSK kyphosis but no focal spinal tenderness Neuro: nonfocal, alert, positive affect Breasts: No longer palpate any significant abnormality  in the right breast. There are no skin or nipple changes of concern. The right axilla is benign. Left breast is status post lumpectomy with no suspicious nodularities. The left axilla is also clear  LAB RESULTS: Lab Results  Component Value Date   WBC 7.1 01/11/2014   NEUTROABS 3.9 01/11/2014   HGB 13.3 01/11/2014   HCT 40.9 01/11/2014   MCV 88.5 01/11/2014   PLT 154 01/11/2014      Chemistry      Component Value Date/Time   NA 141 01/11/2014 1419   NA 137 06/05/2012 1106   K 3.9 01/11/2014 1419   K 3.7 06/05/2012 1106   CL 103 06/05/2012 1106   CL 101 12/23/2011 1020   CO2 23 01/11/2014 1419   CO2 25 06/05/2012 1106   BUN 19.7 01/11/2014 1419   BUN 11 10/13/2012 1205   CREATININE 1.1 01/11/2014 1419   CREATININE 0.9 10/13/2012 1205      Component Value Date/Time   CALCIUM 9.8 01/11/2014 1419   CALCIUM 9.1 06/05/2012 1106   ALKPHOS 147 01/11/2014 1419   ALKPHOS 79 06/05/2012 1106   AST 26 01/11/2014 1419   AST 21 06/05/2012 1106   ALT 37 01/11/2014 1419   ALT 17 06/05/2012 1106   BILITOT 0.48 01/11/2014 1419   BILITOT 1.2 06/05/2012 1106       Lab Results  Component Value Date   LABCA2 19 01/05/2009    STUDIES: No results found.  ASSESSMENT: 80 y.o.  [employer] woman with a history of   (1) remote right lumpectomy and radiation   (2) left lumpectomy and sentinel lymph node biopsy February of 2007 for a T1c NX Stage I invasive ductal carcinoma, grade 1, estrogen and progesterone receptor positive, HER-2 negative, treated initially with anastrozole with good tolerance, switched to tamoxifen June of 2008 for financial reasons   (3) another right-sided breast cancer biopsied March of 2012, measuring 1.1 cm by ultrasound, grade 3, with a clinically negative axilla, strongly estrogen and progesterone receptor positive, no evidence of HER-2 amplification, and MIB-1 of 21%; no definitive surgery, instead the patient was switched back to anastrozole, on which she continues    (4) small bowel mass status post resection in June of 2012 proving to be a T3 N1, stage III well-differentiated carcinoid tumor.   PLAN: Farin is having a good response to anastrozole meaningfully. The plan is to continue that indefinitely. I also see no clinical signs of active carcinoid.  We are going to continue to see Katelyn [name] once a year. I have asked her daughter [name] to make sure anastrozole is on the medication list for her at the nursing home, since they did not send a list of her current meds with her today. If there is any problem of course she will call me. Otherwise I will see Katelyn [name] again in one year. They know to call for any problems that may develop before that visit.  MAGRINAT,GUSTAV C    01/11/2014

## 2014-01-11 NOTE — Telephone Encounter (Signed)
Confirm appt d/t for Oct 2016.

## 2014-01-12 NOTE — Addendum Note (Signed)
Addended by: Laureen Abrahams on: 01/12/2014 05:37 PM   Modules accepted: Orders

## 2014-03-25 ENCOUNTER — Telehealth: Payer: Self-pay | Admitting: Internal Medicine

## 2014-03-25 DIAGNOSIS — H9193 Unspecified hearing loss, bilateral: Secondary | ICD-10-CM

## 2014-03-25 NOTE — Telephone Encounter (Signed)
Pt daughter calling to get referral for audiologist for severe hearing loss. Appt today 1/8 @ 2p, requesting referral today. Advised daughter, MD not in the office today and not certain a referral could be generated in so little time. (Medicare, [employer] ins). As we generally need a few days to a week.  [name], [facility] daughter

## 2014-03-25 NOTE — Telephone Encounter (Signed)
[facility].

## 2014-03-25 NOTE — Telephone Encounter (Signed)
Referral entered, however, the amount of time that it takes for referrals to be approved vary.   Called number below. LVM for Katelyn [name] to call me back.  This forwarded to [place] for review.   FYI: if insurance does not approve it will subscribers responsibility for charges.

## 2014-03-25 NOTE — Telephone Encounter (Signed)
Faxed to [facility] @ [phone] pt has appt 03/25/14

## 2014-03-28 ENCOUNTER — Other Ambulatory Visit: Payer: Self-pay | Admitting: Oncology

## 2014-03-28 DIAGNOSIS — C50919 Malignant neoplasm of unspecified site of unspecified female breast: Secondary | ICD-10-CM

## 2014-04-06 ENCOUNTER — Other Ambulatory Visit: Payer: Self-pay | Admitting: Internal Medicine

## 2014-04-19 ENCOUNTER — Telehealth: Payer: Self-pay | Admitting: Oncology

## 2014-04-19 NOTE — Telephone Encounter (Signed)
cld pt to give r/s time & date of appt-No Answer-mailed copy of sch

## 2014-06-03 ENCOUNTER — Telehealth: Payer: Self-pay

## 2014-06-03 NOTE — Telephone Encounter (Signed)
Daughter states pt is now in skilled nursing facility and recd flu vaccine there.  Daughter states she believes skilled nursing fac doctors are now following patients care---dr leschber advised

## 2014-06-06 NOTE — Telephone Encounter (Signed)
Noted We will update HM for flu status thanks

## 2014-08-03 ENCOUNTER — Other Ambulatory Visit: Payer: Self-pay | Admitting: Internal Medicine

## 2014-09-12 ENCOUNTER — Other Ambulatory Visit: Payer: Self-pay

## 2014-10-10 ENCOUNTER — Telehealth: Payer: Self-pay | Admitting: *Deleted

## 2014-10-10 NOTE — Telephone Encounter (Signed)
I am sorry-- was this pt called back?

## 2014-10-10 NOTE — Telephone Encounter (Signed)
This RN called pt's daughter [name]. She states her mother is residing at [facility] and " states she doesn't want to go out to MD appointments "  Per [name] pt is taking the anastrazole without complaints.

## 2014-10-10 NOTE — Telephone Encounter (Signed)
Retrieved message requesting "Someone call about my mom being on cancer medication.  ------------ she passes away.  Just call."  No [facility] ROI found.

## 2014-10-30 ENCOUNTER — Encounter (HOSPITAL_COMMUNITY): Payer: Self-pay

## 2014-10-30 ENCOUNTER — Emergency Department (HOSPITAL_COMMUNITY): Payer: MEDICARE

## 2014-10-30 ENCOUNTER — Inpatient Hospital Stay (HOSPITAL_COMMUNITY)
Admission: EM | Admit: 2014-10-30 | Discharge: 2014-11-04 | DRG: 291 | Disposition: A | Discharge status: Hospice | Payer: MEDICARE | Attending: Internal Medicine | Admitting: Internal Medicine

## 2014-10-30 DIAGNOSIS — R0600 Dyspnea, unspecified: Secondary | ICD-10-CM | POA: Diagnosis not present

## 2014-10-30 DIAGNOSIS — C50512 Malignant neoplasm of lower-outer quadrant of left female breast: Secondary | ICD-10-CM | POA: Diagnosis present

## 2014-10-30 DIAGNOSIS — B9689 Other specified bacterial agents as the cause of diseases classified elsewhere: Secondary | ICD-10-CM | POA: Diagnosis present

## 2014-10-30 DIAGNOSIS — N390 Urinary tract infection, site not specified: Secondary | ICD-10-CM

## 2014-10-30 DIAGNOSIS — R4182 Altered mental status, unspecified: Secondary | ICD-10-CM | POA: Diagnosis present

## 2014-10-30 DIAGNOSIS — Z515 Encounter for palliative care: Secondary | ICD-10-CM

## 2014-10-30 DIAGNOSIS — G20 Parkinson's disease: Secondary | ICD-10-CM | POA: Diagnosis present

## 2014-10-30 DIAGNOSIS — Z79899 Other long term (current) drug therapy: Secondary | ICD-10-CM

## 2014-10-30 DIAGNOSIS — I4891 Unspecified atrial fibrillation: Secondary | ICD-10-CM

## 2014-10-30 DIAGNOSIS — Z79811 Long term (current) use of aromatase inhibitors: Secondary | ICD-10-CM

## 2014-10-30 DIAGNOSIS — J189 Pneumonia, unspecified organism: Secondary | ICD-10-CM | POA: Diagnosis present

## 2014-10-30 DIAGNOSIS — E785 Hyperlipidemia, unspecified: Secondary | ICD-10-CM | POA: Diagnosis present

## 2014-10-30 DIAGNOSIS — E039 Hypothyroidism, unspecified: Secondary | ICD-10-CM | POA: Diagnosis present

## 2014-10-30 DIAGNOSIS — G9340 Encephalopathy, unspecified: Secondary | ICD-10-CM | POA: Diagnosis not present

## 2014-10-30 DIAGNOSIS — I10 Essential (primary) hypertension: Secondary | ICD-10-CM | POA: Diagnosis present

## 2014-10-30 DIAGNOSIS — J9601 Acute respiratory failure with hypoxia: Secondary | ICD-10-CM

## 2014-10-30 DIAGNOSIS — Z7189 Other specified counseling: Secondary | ICD-10-CM | POA: Diagnosis not present

## 2014-10-30 DIAGNOSIS — K219 Gastro-esophageal reflux disease without esophagitis: Secondary | ICD-10-CM | POA: Diagnosis present

## 2014-10-30 DIAGNOSIS — F329 Major depressive disorder, single episode, unspecified: Secondary | ICD-10-CM | POA: Diagnosis present

## 2014-10-30 DIAGNOSIS — I509 Heart failure, unspecified: Secondary | ICD-10-CM | POA: Diagnosis present

## 2014-10-30 DIAGNOSIS — C50211 Malignant neoplasm of upper-inner quadrant of right female breast: Secondary | ICD-10-CM | POA: Diagnosis present

## 2014-10-30 DIAGNOSIS — E871 Hypo-osmolality and hyponatremia: Secondary | ICD-10-CM

## 2014-10-30 DIAGNOSIS — J9600 Acute respiratory failure, unspecified whether with hypoxia or hypercapnia: Secondary | ICD-10-CM | POA: Diagnosis present

## 2014-10-30 DIAGNOSIS — I480 Paroxysmal atrial fibrillation: Secondary | ICD-10-CM | POA: Diagnosis present

## 2014-10-30 DIAGNOSIS — Z66 Do not resuscitate: Secondary | ICD-10-CM | POA: Diagnosis present

## 2014-10-30 LAB — URINALYSIS, ROUTINE W REFLEX MICROSCOPIC
Glucose, UA: NEGATIVE mg/dL
Ketones, ur: NEGATIVE mg/dL
Leukocytes, UA: NEGATIVE
Nitrite: POSITIVE — AB
Protein, ur: >300 mg/dL — AB
SPECIFIC GRAVITY, URINE: 1.027 (ref 1.005–1.030)
UROBILINOGEN UA: 1 mg/dL (ref 0.0–1.0)
pH: 5.5 (ref 5.0–8.0)

## 2014-10-30 LAB — COMPREHENSIVE METABOLIC PANEL
ALT: 43 U/L (ref 14–54)
ANION GAP: 13 (ref 5–15)
AST: 35 U/L (ref 15–41)
Albumin: 3.4 g/dL — ABNORMAL LOW (ref 3.5–5.0)
Alkaline Phosphatase: 97 U/L (ref 38–126)
BILIRUBIN TOTAL: 0.6 mg/dL (ref 0.3–1.2)
BUN: 23 mg/dL — AB (ref 6–20)
CALCIUM: 8.6 mg/dL — AB (ref 8.9–10.3)
CO2: 20 mmol/L — AB (ref 22–32)
Chloride: 89 mmol/L — ABNORMAL LOW (ref 101–111)
Creatinine, Ser: 1.09 mg/dL — ABNORMAL HIGH (ref 0.44–1.00)
GFR calc non Af Amer: 42 mL/min — ABNORMAL LOW (ref >60)
GFR, EST AFRICAN AMERICAN: 49 mL/min — AB (ref >60)
Glucose, Bld: 152 mg/dL — ABNORMAL HIGH (ref 65–99)
Potassium: 3.7 mmol/L (ref 3.5–5.1)
Sodium: 122 mmol/L — ABNORMAL LOW (ref 135–145)
TOTAL PROTEIN: 6.9 g/dL (ref 6.5–8.1)

## 2014-10-30 LAB — I-STAT ARTERIAL BLOOD GAS, ED
ACID-BASE DEFICIT: 5 mmol/L — AB (ref 0.0–2.0)
Bicarbonate: 21.3 mEq/L (ref 20.0–24.0)
O2 SAT: 94 %
PO2 ART: 75 mmHg — AB (ref 80.0–100.0)
Patient temperature: 98.6
TCO2: 23 mmol/L (ref 0–100)
pCO2 arterial: 41.4 mmHg (ref 35.0–45.0)
pH, Arterial: 7.319 — ABNORMAL LOW (ref 7.350–7.450)

## 2014-10-30 LAB — CBC WITH DIFFERENTIAL/PLATELET
Basophils Absolute: 0 10*3/uL (ref 0.0–0.1)
Basophils Relative: 0 % (ref 0–1)
EOS ABS: 0 10*3/uL (ref 0.0–0.7)
Eosinophils Relative: 0 % (ref 0–5)
HEMATOCRIT: 33.6 % — AB (ref 36.0–46.0)
HEMOGLOBIN: 11.1 g/dL — AB (ref 12.0–15.0)
LYMPHS PCT: 7 % — AB (ref 12–46)
Lymphs Abs: 0.8 10*3/uL (ref 0.7–4.0)
MCH: 28.9 pg (ref 26.0–34.0)
MCHC: 33 g/dL (ref 30.0–36.0)
MCV: 87.5 fL (ref 78.0–100.0)
Monocytes Absolute: 1 10*3/uL (ref 0.1–1.0)
Monocytes Relative: 9 % (ref 3–12)
NEUTROS ABS: 9.9 10*3/uL — AB (ref 1.7–7.7)
NEUTROS PCT: 84 % — AB (ref 43–77)
PLATELETS: 196 10*3/uL (ref 150–400)
RBC: 3.84 MIL/uL — ABNORMAL LOW (ref 3.87–5.11)
RDW: 14.9 % (ref 11.5–15.5)
WBC: 11.7 10*3/uL — AB (ref 4.0–10.5)

## 2014-10-30 LAB — I-STAT TROPONIN, ED: Troponin i, poc: 0.01 ng/mL (ref 0.00–0.08)

## 2014-10-30 LAB — I-STAT CG4 LACTIC ACID, ED: Lactic Acid, Venous: 2.09 mmol/L (ref 0.5–2.0)

## 2014-10-30 LAB — URINE MICROSCOPIC-ADD ON

## 2014-10-30 LAB — BRAIN NATRIURETIC PEPTIDE: B NATRIURETIC PEPTIDE 5: 743.7 pg/mL — AB (ref 0.0–100.0)

## 2014-10-30 MED ORDER — MORPHINE SULFATE 4 MG/ML IJ SOLN
4.0000 mg | INTRAMUSCULAR | Status: DC | PRN
  Administered 2014-10-30: 4 mg via INTRAVENOUS
  Filled 2014-10-30: qty 1

## 2014-10-30 MED ORDER — SODIUM CHLORIDE 0.9 % IV BOLUS (SEPSIS): 500.0000 mL | INTRAVENOUS | Status: DC

## 2014-10-30 MED ORDER — SODIUM CHLORIDE 0.9 % IV BOLUS (SEPSIS)
1000.0000 mL | INTRAVENOUS | Status: AC
  Administered 2014-10-30: 1000 mL via INTRAVENOUS

## 2014-10-30 MED ORDER — DEXTROSE 5 % IV SOLN
500.0000 mg | Freq: Three times a day (TID) | INTRAVENOUS | Status: DC
  Administered 2014-10-30 – 2014-11-02 (×8): 500 mg via INTRAVENOUS
  Filled 2014-10-30 (×13): qty 0.5

## 2014-10-30 MED ORDER — ONDANSETRON HCL 4 MG/2ML IJ SOLN
4.0000 mg | Freq: Once | INTRAMUSCULAR | Status: AC
  Administered 2014-10-30: 4 mg via INTRAVENOUS
  Filled 2014-10-30: qty 2

## 2014-10-30 MED ORDER — VANCOMYCIN HCL IN DEXTROSE 750-5 MG/150ML-% IV SOLN
750.0000 mg | INTRAVENOUS | Status: DC
  Administered 2014-10-30 – 2014-11-01 (×3): 750 mg via INTRAVENOUS
  Filled 2014-10-30 (×4): qty 150

## 2014-10-30 MED ORDER — DILTIAZEM LOAD VIA INFUSION
15.0000 mg | Freq: Once | INTRAVENOUS | Status: DC
  Filled 2014-10-30: qty 15

## 2014-10-30 MED ORDER — DILTIAZEM HCL 100 MG IV SOLR: 5.0000 mg/h | INTRAVENOUS | Status: DC

## 2014-10-30 NOTE — ED Notes (Signed)
[facility] would like her to be seen by MD before they will accept pt.  Concerned with appropriateness of placement.  Dr. Jeneen Rinks notified

## 2014-10-30 NOTE — ED Notes (Signed)
Dr Jeneen Rinks requested to stop NS bolus at this time

## 2014-10-30 NOTE — ED Provider Notes (Signed)
CSN: [id number]     Arrival date & time 10/30/14  06-16-21 History   First MD Initiated Contact with Patient 10/30/14 2030     Chief Complaint  Patient presents with  . Shortness of Breath  . Atrial Fibrillation     (Consider location/radiation/quality/duration/timing/severity/associated sxs/prior Treatment) Patient is a 81 y.o. female presenting with shortness of breath and atrial fibrillation. The history is provided by the EMS personnel, the nursing home, medical records and a relative.  Shortness of Breath Atrial Fibrillation    LEVEL 5 CAVEAT:  AMS This is a 81 year old female with hx of HTN, hypothyroidism, depression, HLP, anxiety, anemia, parkinson's disease, remote hx of AFIB in 06-17-10 not currently on anti-coagulation, presenting to the ED for AFIB and AMS.  Patient was recently treated for UTI at her SNF [facility]), unknown abx for only 3 days.  Was given dose of rocpehin today as well.  Family reports over the past 2 days she has appeared lethargic and is not walking/talking as normal.  They report she appears very confused.  At baseline, patient able to walk/talk as normal with fluid conversation.  No reported fever.  No changes in medications aside from abx.   Past Medical History  Diagnosis Date  . Hypertension   . Hypothyroidism   . Hx of colonic polyps     RLQ mensenteric mass-1.4cmx2.1cm CT 02/20/10 ? Carcinoid  . Hyperlipidemia   . Depression Jun 17, 1999    following death of spouse  . Hiatal hernia   . Clostridium difficile colitis   . Anxiety   . Anemia   . Parkinson disease   . Hx of adenomatous colonic polyps 06/25/01  . GERD (gastroesophageal reflux disease)   . Carcinoid tumor, malignant     09/03/10 resection; 2/9 positive LN  . Atrial fibrillation     peri/post op 08/2010 - resumed NSR  . Cholelithiasis June 17, 2011  . History of blood transfusion 1950    x 6 units  . Hard of hearing   . Cancer      BREAST- right and left/ s/p radiation, tamifoxen   Past Surgical  History  Procedure Laterality Date  . Abdominal hysterectomy  1969  . Back surgery  1959  . Breast surgery  1995    (R) Lumpectomy  . Mastectomy, partial  2003/06/17    left  . Eye surgery      bilateral cataract extraction with IOL,   repair left tear duct  . Colon surgery      resection for carcinoid tumor  . Cholecystectomy  03/25/2012    Procedure: LAPAROSCOPIC CHOLECYSTECTOMY WITH INTRAOPERATIVE CHOLANGIOGRAM;  Surgeon: Edward Jolly, MD;  Location: [facility]S;  Service: General;  Laterality: N/A;   Family History  Problem Relation Age of Onset  . Breast cancer Mother   . Hyperlipidemia Mother   . Hypertension Mother   . Colon cancer Mother     ?  . Prostate cancer Father   . Breast cancer Sister     x'2  . Breast cancer Daughter   . Colon polyps Daughter   . Colon polyps Son   . Colon cancer Father    Social History  Substance Use Topics  . Smoking status: Never Smoker   . Smokeless tobacco: Never Used  . Alcohol Use: No   OB History    No data available     Review of Systems  Unable to perform ROS: Mental status change      Allergies  Review of patient's  allergies indicates no known allergies.  Home Medications   Prior to Admission medications   Medication Sig Start Date End Date Taking? Authorizing Provider  amLODipine (NORVASC) 5 MG tablet Take 1 tablet by mouth  daily    Rowe Clack, MD  anastrozole (ARIMIDEX) 1 MG tablet TAKE 1 TABLET BY MOUTH EVERY DAY 03/28/14   Chauncey Cruel, MD  atorvastatin (LIPITOR) 20 MG tablet Take 1 tablet by mouth  every day    Rowe Clack, MD  Calcium-Vitamin D-Iron (CALCIUM 600 IRON/D) [phone] MG-UNIT-MG TABS Take 1 capsule by mouth daily.     Historical Provider, MD  dicyclomine (BENTYL) 10 MG capsule Take 1 capsule (10 mg total) by mouth at bedtime. 10/09/12   Lafayette Dragon, MD  diphenoxylate-atropine (LOMOTIL) 2.5-0.025 MG per tablet TAKE 1 TABLET BY MOUTH FOUR TIMES DAILY AS NEEDED FOR DIARRHEA/LOOSE  STOOL 06/30/12   Rowe Clack, MD  levothyroxine (SYNTHROID, LEVOTHROID) 50 MCG tablet TAKE 1 TABLET BY MOUTH EVERY DAY 04/12/13   Rowe Clack, MD  metoprolol tartrate (LOPRESSOR) 25 MG tablet Take 1 tablet by mouth two  times daily 11/08/13   Rowe Clack, MD  metroNIDAZOLE (FLAGYL) 500 MG tablet Take 500 mg by mouth 3 (three) times daily. 1/2 tablet bid    Historical Provider, MD  Multiple Vitamins-Minerals (CENTRUM SILVER) tablet Take 1 tablet by mouth daily.     Historical Provider, MD  nortriptyline (PAMELOR) 10 MG capsule Take 1 capsule (10 mg total) by mouth at bedtime. 08/04/14   Rowe Clack, MD  omeprazole (PRILOSEC) 40 MG capsule Take 1 capsule by mouth  daily    Rowe Clack, MD  ondansetron (ZOFRAN) 4 MG tablet Take 1 tablet (4 mg total) by mouth every 8 (eight) hours as needed for nausea. 06/05/12   Rowe Clack, MD  simvastatin (ZOCOR) 40 MG tablet Take 40 mg by mouth daily.    Historical Provider, MD   BP 147/117 mmHg  Pulse 120  Temp(Src) 98.8 F (37.1 C) (Axillary)  Resp 62  Ht 5\' 3"  (1.6 m)  Wt 153 lb (69.4 kg)  BMI 27.11 kg/m2  SpO2 95%   Physical Exam  Constitutional: She is oriented to person, place, and time. She appears well-developed and well-nourished. She appears distressed.  Appears ill  HENT:  Head: Normocephalic and atraumatic.  Mouth/Throat: Oropharynx is clear and moist.  Eyes: Conjunctivae and EOM are normal. Pupils are equal, round, and reactive to light.  Neck: Normal range of motion. Neck supple.  Cardiovascular: Normal heart sounds.  An irregularly irregular rhythm present. Tachycardia present.   Pulmonary/Chest: Breath sounds normal. Tachypnea noted. She is in respiratory distress.  Abdominal: Soft. Bowel sounds are normal.  Musculoskeletal: Normal range of motion. She exhibits edema.  Pitting edema BLE  Neurological: She is alert and oriented to person, place, and time.  Skin: Skin is warm and dry.  Psychiatric:  She has a normal mood and affect.  Nursing note and vitals reviewed.   ED Course  Procedures (including critical care time)  CRITICAL CARE Performed by: Larene Pickett   Total critical care time: 60  Critical care time was exclusive of separately billable procedures and treating other patients.  Critical care was necessary to treat or prevent imminent or life-threatening deterioration.  Critical care was time spent personally by me on the following activities: development of treatment plan with patient and/or surrogate as well as nursing, discussions with consultants, evaluation of patient's response  to treatment, examination of patient, obtaining history from patient or surrogate, ordering and performing treatments and interventions, ordering and review of laboratory studies, ordering and review of radiographic studies, pulse oximetry and re-evaluation of patient's condition.  Medications  sodium chloride 0.9 % bolus 1,000 mL (0 mLs Intravenous Stopped 10/30/14 2129)  aztreonam (AZACTAM) 500 mg in dextrose 5 % 50 mL IVPB (0 mg Intravenous Stopped 10/30/14 2150)  vancomycin (VANCOCIN) IVPB 750 mg/150 ml premix (750 mg Intravenous Given 10/30/14 2158)  diltiazem (CARDIZEM) 1 mg/mL load via infusion 15 mg (not administered)    And  diltiazem (CARDIZEM) 100 mg in dextrose 5 % 100 mL (1 mg/mL) infusion (not administered)  morphine 4 MG/ML injection 4 mg (4 mg Intravenous Given 10/30/14 2306)  ondansetron (ZOFRAN) injection 4 mg (4 mg Intravenous Given 10/30/14 2308)    Labs Review Labs Reviewed  COMPREHENSIVE METABOLIC PANEL - Abnormal; Notable for the following:    Sodium 122 (*)    Chloride 89 (*)    CO2 20 (*)    Glucose, Bld 152 (*)    BUN 23 (*)    Creatinine, Ser 1.09 (*)    Calcium 8.6 (*)    Albumin 3.4 (*)    GFR calc non Af Amer 42 (*)    GFR calc Af Amer 49 (*)    All other components within normal limits  CBC WITH DIFFERENTIAL/PLATELET - Abnormal; Notable for the  following:    WBC 11.7 (*)    RBC 3.84 (*)    Hemoglobin 11.1 (*)    HCT 33.6 (*)    Neutrophils Relative % 84 (*)    Neutro Abs 9.9 (*)    Lymphocytes Relative 7 (*)    All other components within normal limits  URINALYSIS, ROUTINE W REFLEX MICROSCOPIC (NOT AT [facility]) - Abnormal; Notable for the following:    Color, Urine AMBER (*)    Hgb urine dipstick MODERATE (*)    Bilirubin Urine SMALL (*)    Protein, ur >300 (*)    Nitrite POSITIVE (*)    All other components within normal limits  URINE MICROSCOPIC-ADD ON - Abnormal; Notable for the following:    Bacteria, UA MANY (*)    Casts HYALINE CASTS (*)    All other components within normal limits  I-STAT ARTERIAL BLOOD GAS, ED - Abnormal; Notable for the following:    pH, Arterial 7.319 (*)    pO2, Arterial 75.0 (*)    Acid-base deficit 5.0 (*)    All other components within normal limits  CULTURE, BLOOD (ROUTINE X 2)  CULTURE, BLOOD (ROUTINE X 2)  URINE CULTURE  BRAIN NATRIURETIC PEPTIDE  I-STAT CG4 LACTIC ACID, ED  Randolm Idol, ED    Imaging Review Dg Chest Port 1 View  10/30/2014   CLINICAL DATA:  Code sepsis. Acute onset of shortness of breath and atrial fibrillation. Initial encounter.  EXAM: PORTABLE CHEST - 1 VIEW  COMPARISON:  Chest radiograph from 03/17/2012  FINDINGS: The lungs are hypoexpanded. Small bilateral pleural effusions are seen, right greater than left. Bilateral airspace opacities may reflect pulmonary edema or possibly pneumonia. There is no evidence of pneumothorax.  The cardiomediastinal silhouette is mildly enlarged. No acute osseous abnormalities are seen.  IMPRESSION: Lungs hypoexpanded. Small bilateral pleural effusions, right greater than left. Bilateral airspace opacities may reflect pulmonary edema or possibly pneumonia. Mild cardiomegaly noted.   Electronically Signed   By: Garald Balding M.D.   On: 10/30/2014 21:19   I, [place], [place]  M, personally reviewed and evaluated these images and lab  results as part of my medical decision-making.   EKG Interpretation None      MDM   Final diagnoses:  Atrial fibrillation, unspecified  Congestive heart failure, unspecified congestive heart failure chronicity, unspecified congestive heart failure type  Acute respiratory failure with hypoxia  UTI (lower urinary tract infection)  Hyponatremia   81 year old female here with atrial fibrillation and altered mental status. Remote history of A. fib in 2012 in the setting of postoperative state. She is not currently on anticoagulation. Patient is afebrile, however she appears very unwell.  Clinically she appears confused, and patient's family report she has had a significant decline from her baseline over the past 2 days. With recent UTI, code sepsis was activated-- started vancomycin and azactam.  Work-up pending at this time.  Fluids ordered but held pending CXR given her hypoxia and tachypnea.  Was already given 500cc bolus by EMS.  Labwork as above, leukocytosis noted at 11.7. Electrolyte imbalance with hyponatremia and hypochloremia. ABG with respiratory failure.  CXR with questionable edema versus pneumonia. UA nitrite positive. Blood and urine cultures pending. Patient remains tachypneic and hypoxic with sats of 77% on RA.  Initially started bipap, however patient not tolerating very well.  Patient's DNR is not  dated, however family present does report they do not was resuscitation or intubation.  They are aware of potential respiratory arrest given her current state.  At this time given that patient remains afebrile, will proceed with fluid and rate controlled measures with cardizem bolus and drip as.  Morphine given as patient beginning to get agitated causing subsequent increase in heart rate.  Case discussed with hospitalist service, Dr. Hal Hope, who will admit to step-down.  Case discussed with attending physician, Dr. Jeneen Rinks, who evaluated patient and agrees with assessment and plan of  care.  Larene Pickett, PA-C 10/31/14 0016  Tanna Furry, MD 11/08/14 [phone]

## 2014-10-30 NOTE — ED Notes (Signed)
[facility] c/o [facility]B and new A-fib.  Pt recently treated for UTI and epigastric pain. Pt on NRB at 99%.  Pt arrived with undated DNR [place].

## 2014-10-30 NOTE — ED Notes (Signed)
Pt family requesting medication for agitation, Dr Jeneen Rinks notified.

## 2014-10-30 NOTE — Progress Notes (Addendum)
ANTIBIOTIC CONSULT NOTE - INITIAL  Pharmacy Consult for aztreonam Indication: urosepsis  No Known Allergies  Patient Measurements:   Adjusted Body Weight:   Vital Signs: Temp: 97.7 F (36.5 C) (08/14 2029/06/18) Temp Source: Axillary (08/14 18-Jun-2029) Intake/Output from previous day:   Intake/Output from this shift:    Labs: No results for input(s): WBC, HGB, PLT, LABCREA, CREATININE in the last 72 hours. CrCl cannot be calculated (Unknown ideal weight.). No results for input(s): VANCOTROUGH, VANCOPEAK, VANCORANDOM, GENTTROUGH, GENTPEAK, GENTRANDOM, TOBRATROUGH, TOBRAPEAK, TOBRARND, AMIKACINPEAK, AMIKACINTROU, AMIKACIN in the last 72 hours.   Microbiology: No results found for this or any previous visit (from the past 720 hour(s)).  Medical History: Past Medical History  Diagnosis Date  . Hypertension   . Hypothyroidism   . Hx of colonic polyps     RLQ mensenteric mass-1.4cmx2.1cm CT 02/20/10 ? Carcinoid  . Hyperlipidemia   . Depression 1999-06-19    following death of spouse  . Hiatal hernia   . Clostridium difficile colitis   . Anxiety   . Anemia   . Parkinson disease   . Hx of adenomatous colonic polyps 06/25/01  . GERD (gastroesophageal reflux disease)   . Carcinoid tumor, malignant     09/03/10 resection; 2/9 positive LN  . Atrial fibrillation     peri/post op 08/2010 - resumed NSR  . Cholelithiasis 2011-06-19  . History of blood transfusion 1950    x 6 units  . Hard of hearing   . Cancer      BREAST- right and left/ s/p radiation, tamifoxen    Medications:  Scheduled:   Infusions:  . sodium chloride     Followed by  . sodium chloride     Assessment: 81 yo female with urosepsis will be started on aztreonam.  No baseline lab yet; historic SCr ~ 1.1  Goal of Therapy:  Resolution of infeciton  Plan:  - aztreonam 500 mg iv q8h - f/u SCr to verify dosing  Gwendolyne Welford, Tsz-Yin 10/30/2014,8:48 PM   Adding vancomycin.  Based on historic SCr, start vancomycin 750 mg iv q24h.   Goal trough 15-20.  F/u SCr to verify dosing

## 2014-10-30 NOTE — ED Provider Notes (Addendum)
Pt seen with PA Sanders.  Pt acutely ill. History is that patient has "not felt well" for 3-4 days with progressive prostration and fatigue.  Apparently, had + [place] and given rocephin yesterday.  Today, became acutely confused, was hypoxemic to 76%, and transported.    On initial exam and initial studies, it is not evident to me if this is Pneumonia, or CHF.  Pt However, is not febrile, and no leukocytosis.  Does have New dependent edema, and new AF c RVR.  ABG shows Resp failure with normal Pco2 despite RR over 30.  Trial on BiPap mask, pt not tolerating.  Given Cardizem infusion and drip. Gven Morphine for agitation and dyspnea.    Discussed at length, and on multiple re-evaluations with family.  Pt is DNR, and all family members present concur.  I have discuss with them the high morbidity of respiratory failure 2/2 chf and or [place] in a 80 y/o.  I have encouraged them to begin to consider comfort measures.  I discussed the case with Dr. Rosina Lowenstein.    CRITICAL CARE Performed by: Tanna Furry JOSEPH   Total critical care time:60 minutes.    Critical care time was exclusive of separately billable procedures and treating other patients.  Critical care was necessary to treat or prevent imminent or life-threatening deterioration.  Critical care was time spent personally by me on the following activities: development of treatment plan with patient and/or surrogate as well as nursing, discussions with consultants, evaluation of patient's response to treatment, examination of patient, obtaining history from patient or surrogate, ordering and performing treatments and interventions, ordering and review of laboratory studies, ordering and review of radiographic studies, pulse oximetry and re-evaluation of patient's condition.     Tanna Furry, MD 10/30/14 2352  Tanna Furry, MD 10/31/14 [phone]

## 2014-10-30 NOTE — ED Notes (Signed)
Notified Dr Nicki Reaper of 87/55 BP

## 2014-10-31 ENCOUNTER — Inpatient Hospital Stay (HOSPITAL_COMMUNITY): Payer: MEDICARE

## 2014-10-31 ENCOUNTER — Encounter (HOSPITAL_COMMUNITY): Payer: Self-pay | Admitting: *Deleted

## 2014-10-31 DIAGNOSIS — J9600 Acute respiratory failure, unspecified whether with hypoxia or hypercapnia: Secondary | ICD-10-CM | POA: Diagnosis present

## 2014-10-31 DIAGNOSIS — Z7189 Other specified counseling: Secondary | ICD-10-CM

## 2014-10-31 DIAGNOSIS — Z515 Encounter for palliative care: Secondary | ICD-10-CM

## 2014-10-31 DIAGNOSIS — G9340 Encephalopathy, unspecified: Secondary | ICD-10-CM | POA: Diagnosis present

## 2014-10-31 DIAGNOSIS — I509 Heart failure, unspecified: Secondary | ICD-10-CM

## 2014-10-31 DIAGNOSIS — I4891 Unspecified atrial fibrillation: Secondary | ICD-10-CM | POA: Diagnosis present

## 2014-10-31 DIAGNOSIS — J9601 Acute respiratory failure with hypoxia: Secondary | ICD-10-CM | POA: Diagnosis present

## 2014-10-31 LAB — CBC WITH DIFFERENTIAL/PLATELET
BASOS PCT: 0 % (ref 0–1)
Basophils Absolute: 0 10*3/uL (ref 0.0–0.1)
EOS PCT: 0 % (ref 0–5)
Eosinophils Absolute: 0 10*3/uL (ref 0.0–0.7)
HEMATOCRIT: 31.3 % — AB (ref 36.0–46.0)
Hemoglobin: 10.3 g/dL — ABNORMAL LOW (ref 12.0–15.0)
LYMPHS ABS: 1.9 10*3/uL (ref 0.7–4.0)
Lymphocytes Relative: 13 % (ref 12–46)
MCH: 29 pg (ref 26.0–34.0)
MCHC: 32.9 g/dL (ref 30.0–36.0)
MCV: 88.2 fL (ref 78.0–100.0)
MONO ABS: 1.2 10*3/uL — AB (ref 0.1–1.0)
Monocytes Relative: 8 % (ref 3–12)
NEUTROS ABS: 11.7 10*3/uL — AB (ref 1.7–7.7)
Neutrophils Relative %: 79 % — ABNORMAL HIGH (ref 43–77)
Platelets: 174 10*3/uL (ref 150–400)
RBC: 3.55 MIL/uL — AB (ref 3.87–5.11)
RDW: 14.9 % (ref 11.5–15.5)
WBC: 14.8 10*3/uL — AB (ref 4.0–10.5)

## 2014-10-31 LAB — COMPREHENSIVE METABOLIC PANEL
ALT: 38 U/L (ref 14–54)
AST: 32 U/L (ref 15–41)
Albumin: 2.9 g/dL — ABNORMAL LOW (ref 3.5–5.0)
Alkaline Phosphatase: 84 U/L (ref 38–126)
Anion gap: 8 (ref 5–15)
BUN: 25 mg/dL — AB (ref 6–20)
CHLORIDE: 92 mmol/L — AB (ref 101–111)
CO2: 25 mmol/L (ref 22–32)
CREATININE: 1.11 mg/dL — AB (ref 0.44–1.00)
Calcium: 8.3 mg/dL — ABNORMAL LOW (ref 8.9–10.3)
GFR calc Af Amer: 48 mL/min — ABNORMAL LOW (ref >60)
GFR, EST NON AFRICAN AMERICAN: 41 mL/min — AB (ref >60)
Glucose, Bld: 85 mg/dL (ref 65–99)
Potassium: 3.8 mmol/L (ref 3.5–5.1)
Sodium: 125 mmol/L — ABNORMAL LOW (ref 135–145)
Total Bilirubin: 0.6 mg/dL (ref 0.3–1.2)
Total Protein: 5.8 g/dL — ABNORMAL LOW (ref 6.5–8.1)

## 2014-10-31 LAB — GLUCOSE, CAPILLARY
GLUCOSE-CAPILLARY: 73 mg/dL (ref 65–99)
Glucose-Capillary: 92 mg/dL (ref 65–99)

## 2014-10-31 LAB — LACTIC ACID, PLASMA: Lactic Acid, Venous: 1.1 mmol/L (ref 0.5–2.0)

## 2014-10-31 LAB — MRSA PCR SCREENING: MRSA by PCR: NEGATIVE

## 2014-10-31 LAB — TSH: TSH: 2.292 u[IU]/mL (ref 0.350–4.500)

## 2014-10-31 LAB — HEPARIN LEVEL (UNFRACTIONATED): Heparin Unfractionated: 0.12 IU/mL — ABNORMAL LOW (ref 0.30–0.70)

## 2014-10-31 LAB — PROCALCITONIN

## 2014-10-31 MED ORDER — PERFLUTREN LIPID MICROSPHERE
INTRAVENOUS | Status: AC
  Administered 2014-10-31: 16:00:00
  Filled 2014-10-31: qty 10

## 2014-10-31 MED ORDER — ANASTROZOLE 1 MG PO TABS
1.0000 mg | ORAL_TABLET | Freq: Every day | ORAL | Status: DC
  Filled 2014-10-31: qty 1

## 2014-10-31 MED ORDER — ACETAMINOPHEN 650 MG RE SUPP: 650.0000 mg | Freq: Four times a day (QID) | RECTAL | Status: DC | PRN

## 2014-10-31 MED ORDER — SODIUM CHLORIDE 0.9 % IJ SOLN
3.0000 mL | Freq: Two times a day (BID) | INTRAMUSCULAR | Status: DC
  Administered 2014-11-01 – 2014-11-03 (×3): 3 mL via INTRAVENOUS

## 2014-10-31 MED ORDER — ACETAMINOPHEN 325 MG PO TABS: 650.0000 mg | ORAL_TABLET | Freq: Four times a day (QID) | ORAL | Status: DC | PRN

## 2014-10-31 MED ORDER — ONDANSETRON HCL 4 MG PO TABS: 4.0000 mg | ORAL_TABLET | Freq: Four times a day (QID) | ORAL | Status: DC | PRN

## 2014-10-31 MED ORDER — LEVOTHYROXINE SODIUM 100 MCG PO TABS
100.0000 ug | ORAL_TABLET | Freq: Every day | ORAL | Status: DC
  Filled 2014-10-31: qty 1

## 2014-10-31 MED ORDER — METOPROLOL TARTRATE 50 MG PO TABS
50.0000 mg | ORAL_TABLET | Freq: Two times a day (BID) | ORAL | Status: DC
  Filled 2014-10-31 (×2): qty 1

## 2014-10-31 MED ORDER — SODIUM CHLORIDE 0.9 % IV BOLUS (SEPSIS)
250.0000 mL | Freq: Once | INTRAVENOUS | Status: AC
  Administered 2014-10-31: 250 mL via INTRAVENOUS

## 2014-10-31 MED ORDER — HEPARIN BOLUS VIA INFUSION
3500.0000 [IU] | Freq: Once | INTRAVENOUS | Status: AC
  Administered 2014-10-31: 3500 [IU] via INTRAVENOUS
  Filled 2014-10-31: qty 3500

## 2014-10-31 MED ORDER — ONDANSETRON HCL 4 MG/2ML IJ SOLN
4.0000 mg | Freq: Four times a day (QID) | INTRAMUSCULAR | Status: DC | PRN
  Administered 2014-11-01: 4 mg via INTRAVENOUS
  Filled 2014-10-31: qty 2

## 2014-10-31 MED ORDER — NORTRIPTYLINE HCL 10 MG PO CAPS
10.0000 mg | ORAL_CAPSULE | Freq: Every day | ORAL | Status: DC
  Filled 2014-10-31 (×2): qty 1

## 2014-10-31 MED ORDER — ATORVASTATIN CALCIUM 20 MG PO TABS
20.0000 mg | ORAL_TABLET | Freq: Every day | ORAL | Status: DC
  Filled 2014-10-31: qty 1

## 2014-10-31 MED ORDER — LEVOTHYROXINE SODIUM 100 MCG IV SOLR
50.0000 ug | Freq: Every day | INTRAVENOUS | Status: DC
  Administered 2014-10-31 – 2014-11-03 (×4): 50 ug via INTRAVENOUS
  Filled 2014-10-31 (×6): qty 5

## 2014-10-31 MED ORDER — HEPARIN (PORCINE) IN NACL 100-0.45 UNIT/ML-% IJ SOLN
850.0000 [IU]/h | INTRAMUSCULAR | Status: DC
  Administered 2014-10-31: 950 [IU]/h via INTRAVENOUS
  Administered 2014-11-01: 1100 [IU]/h via INTRAVENOUS
  Administered 2014-11-02: 850 [IU]/h via INTRAVENOUS
  Filled 2014-10-31 (×6): qty 250

## 2014-10-31 MED ORDER — PANTOPRAZOLE SODIUM 40 MG PO TBEC: 40.0000 mg | DELAYED_RELEASE_TABLET | Freq: Every day | ORAL | Status: DC

## 2014-10-31 NOTE — Consult Note (Signed)
Consultation Note Date: 10/31/2014   Patient Name: Katelyn [name]  DOB: 09/20/1918  MRN: [id number]  Age / Sex: 81 y.o., female   PCP: Rowe Clack, MD Referring Physician: Delfina Redwood, MD  Reason for Consultation: Establishing goals of care  Palliative Care Assessment and Plan Summary of Established Goals of Care and Medical Treatment Preferences    Palliative Care Discussion Held Today:    This NP Wadie Lessen reviewed medical records, received report from team, assessed the patient and then meet at the patient's bedside along with her son Leya Paige his wife, daughter Pati Gallo and community pastor to discuss diagnosis, prognosis, GOC,  options.  A detailed discussion was had today regarding advanced directives.   The difference between a aggressive medical intervention path  and a palliative comfort care path for this patient at this time was had.  Values and goals of care important to patient and family were attempted to be elicited.  Concept of Palliative Care was discussed  Natural trajectory and expectations at EOL were discussed.  Questions and concerns addressed.   PMT will continue to support holistically.   Daughter Tatayana Beshears according to her family is HPOA.  She is currently seeking medical attention for her own back pain.  There is one more son who lives local here in [place].  Family is waiting for input from attending.  They spoke to an ER doctor last night.   I spoke to Dr Conley Canal and she plans to meet this family after lunch around 1:00 to update on current condition and answer their questions and concerns.  Family present is notifying the rest of the family, so that all may be present.    PMT will continue to support holistically      Goals of Care/Code Status/Advance Care Planning:  At this time family remains hopeful for improvement and wish to continue to treat the trreatable. Son Fritz Pickerel states "we are not ready to give up on her  yet", daughter Buck Mam states "I do not want her to suffer"   Psycho-social/Spiritual:    Support System: family and "her friends" at the nursing facility  Desire for further Chaplaincy support:  Community pastor is present for this meeting  Prognosis:  Pending decisions regarding ongoing medical interventions  Discharge Planning:  Pending       Chief Complaint:  Altered mental status  History of Present Illness:   81 y.o. female with history of hypertension, hyperlipidemia, hypothyroidism, breast cancer, history of carcinoid status post resection in 2012 and paroxysmal atrial fibrillation not on anticoagulation was brought to the ER after patient became confused and agitated and short of breath. As per the patient's daughter with whom I discussed patient was found to be confused over the last 2 days and was treated with antibiotics for UTI. Yesterday afternoon patient became acutely confused and agitated and short of breath and was brought to the ER. ER patient was found to be in A. fib with RVR and chest x-ray showed bilateral infiltrates concerning for CHF versus pneumonia. X-ray also showed bilateral pleural effusion. Patient was hypoxic and was placed on BiPAP. Due to agitation patient was given morphine for which patient became sedated. Patient was started on empiric antibiotics  Patient has been admitted for acute respiratory failure and further management.   Patient remains on Bipap and is minimally responsive, family is faced with clarifying advanced directive decisions as it relates to acceptable medical intervetnions   Primary Diagnoses  Present on  Admission:  . Essential hypertension . Hypothyroidism . Atrial fibrillation with RVR . Acute respiratory failure  Palliative Review of Systems:    -unable to illicit due to decreased cognition   I have reviewed the medical record, interviewed the patient and family, and examined the patient. The following aspects  are pertinent.  Past Medical History  Diagnosis Date  . Hypertension   . Hypothyroidism   . Hx of colonic polyps     RLQ mensenteric mass-1.4cmx2.1cm CT 02/20/10 ? Carcinoid  . Hyperlipidemia   . Depression 06/29/1999    following death of spouse  . Hiatal hernia   . Clostridium difficile colitis   . Anxiety   . Anemia   . Parkinson disease   . Hx of adenomatous colonic polyps 06/25/01  . GERD (gastroesophageal reflux disease)   . Carcinoid tumor, malignant     09/03/10 resection; 2/9 positive LN  . Atrial fibrillation     peri/post op 08/2010 - resumed NSR  . Cholelithiasis 2011/06/29  . History of blood transfusion 1950    x 6 units  . Hard of hearing   . Cancer      BREAST- right and left/ s/p radiation, tamifoxen   Social History   Social History  . Marital Status: Widowed    Spouse Name: N/A  . Number of Children: N/A  . Years of Education: N/A   Social History Main Topics  . Smoking status: Never Smoker   . Smokeless tobacco: Never Used  . Alcohol Use: No  . Drug Use: No  . Sexual Activity: Not Asked   Other Topics Concern  . None   Social History Narrative   Widowed, lives alone but supportive children (4 in town). Has sitter/aide M-F to assist with chores, shopping/transport, comp   Family History  Problem Relation Age of Onset  . Breast cancer Mother   . Hyperlipidemia Mother   . Hypertension Mother   . Colon cancer Mother     ?  . Prostate cancer Father   . Breast cancer Sister     x'2  . Breast cancer Daughter   . Colon polyps Daughter   . Colon polyps Son   . Colon cancer Father    Scheduled Meds: . anastrozole  1 mg Oral Daily  . atorvastatin  20 mg Oral q1800  . aztreonam  500 mg Intravenous Q8H  . levothyroxine  100 mcg Oral QAC breakfast  . metoprolol  50 mg Oral BID  . nortriptyline  10 mg Oral QHS  . pantoprazole  40 mg Oral Daily  . sodium chloride  1,000 mL Intravenous Q1H  . sodium chloride  3 mL Intravenous Q12H  . vancomycin  750 mg  Intravenous Q24H   Continuous Infusions: . heparin 950 Units/hr (10/31/14 0550)   PRN Meds:.acetaminophen **OR** acetaminophen, ondansetron **OR** ondansetron (ZOFRAN) IV Medications Prior to Admission:  Prior to Admission medications   Medication Sig Start Date End Date Taking? Authorizing Provider  acetaminophen (TYLENOL) 325 MG tablet Take 650 mg by mouth every 6 (six) hours as needed for moderate pain.   Yes Historical Provider, MD  amLODipine (NORVASC) 5 MG tablet Take 1 tablet by mouth  daily   Yes Rowe Clack, MD  anastrozole (ARIMIDEX) 1 MG tablet TAKE 1 TABLET BY MOUTH EVERY DAY 03/28/14  Yes Chauncey Cruel, MD  calcium citrate-vitamin D (CITRACAL+D) 315-200 MG-UNIT per tablet Take 2 tablets by mouth daily.   Yes Historical Provider, MD  Cholecalciferol 1000 UNITS  capsule Take 1,000 Units by mouth daily.   Yes Historical Provider, MD  levothyroxine (SYNTHROID, LEVOTHROID) 50 MCG tablet TAKE 1 TABLET BY MOUTH EVERY DAY Patient taking differently: Take 100 mcg by mouth daily before breakfast.  04/12/13  Yes Rowe Clack, MD  metoprolol (LOPRESSOR) 50 MG tablet Take 50 mg by mouth 2 (two) times daily.   Yes Historical Provider, MD  nortriptyline (PAMELOR) 10 MG capsule Take 1 capsule (10 mg total) by mouth at bedtime. 08/04/14  Yes Rowe Clack, MD  omeprazole (PRILOSEC) 40 MG capsule Take 1 capsule by mouth  daily   Yes Rowe Clack, MD  dicyclomine (BENTYL) 10 MG capsule Take 1 capsule (10 mg total) by mouth at bedtime. 10/09/12   Lafayette Dragon, MD  diphenoxylate-atropine (LOMOTIL) 2.5-0.025 MG per tablet TAKE 1 TABLET BY MOUTH FOUR TIMES DAILY AS NEEDED FOR DIARRHEA/LOOSE STOOL 06/30/12   Rowe Clack, MD  metoprolol tartrate (LOPRESSOR) 25 MG tablet Take 1 tablet by mouth two  times daily 11/08/13   Rowe Clack, MD   Allergies  Allergen Reactions  . Lactose Intolerance (Gi) Other (See Comments)    UNKNOWN   CBC:    Component Value Date/Time     WBC 14.8* 10/31/2014 0231   WBC 7.1 01/11/2014 1418   HGB 10.3* 10/31/2014 0231   HGB 13.3 01/11/2014 1418   HCT 31.3* 10/31/2014 0231   HCT 40.9 01/11/2014 1418   PLT 174 10/31/2014 0231   PLT 154 01/11/2014 1418   MCV 88.2 10/31/2014 0231   MCV 88.5 01/11/2014 1418   NEUTROABS 11.7* 10/31/2014 0231   NEUTROABS 3.9 01/11/2014 1418   LYMPHSABS 1.9 10/31/2014 0231   LYMPHSABS 1.8 01/11/2014 1418   MONOABS 1.2* 10/31/2014 0231   MONOABS 1.3* 01/11/2014 1418   EOSABS 0.0 10/31/2014 0231   EOSABS 0.0 01/11/2014 1418   BASOSABS 0.0 10/31/2014 0231   BASOSABS 0.0 01/11/2014 1418   Comprehensive Metabolic Panel:    Component Value Date/Time   NA 125* 10/31/2014 0231   NA 141 01/11/2014 1419   K 3.8 10/31/2014 0231   K 3.9 01/11/2014 1419   CL 92* 10/31/2014 0231   CL 101 12/23/2011 1020   CO2 25 10/31/2014 0231   CO2 23 01/11/2014 1419   BUN 25* 10/31/2014 0231   BUN 19.7 01/11/2014 1419   CREATININE 1.11* 10/31/2014 0231   CREATININE 1.1 01/11/2014 1419   GLUCOSE 85 10/31/2014 0231   GLUCOSE 104 01/11/2014 1419   GLUCOSE 106* 12/23/2011 1020   CALCIUM 8.3* 10/31/2014 0231   CALCIUM 9.8 01/11/2014 1419   AST 32 10/31/2014 0231   AST 26 01/11/2014 1419   ALT 38 10/31/2014 0231   ALT 37 01/11/2014 1419   ALKPHOS 84 10/31/2014 0231   ALKPHOS 147 01/11/2014 1419   BILITOT 0.6 10/31/2014 0231   BILITOT 0.48 01/11/2014 1419   PROT 5.8* 10/31/2014 0231   PROT 7.5 01/11/2014 1419   ALBUMIN 2.9* 10/31/2014 0231   ALBUMIN 3.3* 01/11/2014 1419    Physical Exam:  Vital Signs: BP 90/31 mmHg  Pulse 86  Temp(Src) 97.1 F (36.2 C) (Axillary)  Resp 17  Ht 5\' 2"  (1.575 m)  Wt 74.1 kg (163 lb 5.8 oz)  BMI 29.87 kg/m2  SpO2 98% SpO2: SpO2: 98 % O2 Device: O2 Device: Bi-PAP O2 Flow Rate: O2 Flow Rate (L/min): 80 L/min Intake/output summary:  Intake/Output Summary (Last 24 hours) at 10/31/14 1232 Last data filed at 10/31/14 1200  Gross per  24 hour  Intake    676 ml   Output    195 ml  Net    481 ml   LBM:   Baseline Weight: Weight: 69.4 kg (153 lb) Most recent weight: Weight: 74.1 kg (163 lb 5.8 oz)  Exam Findings:   General: ill appearing, NAD CVS: RRR Resp: BiPap in place Neuro: minimally responsive to gentle touch and verbal stimuli           Palliative Performance Scale: 20 %                Additional Data Reviewed: Recent Labs     10/30/14  2049  10/31/14  0231  WBC  11.7*  14.8*  HGB  11.1*  10.3*  PLT  196  174  NA  122*  125*  BUN  23*  25*  CREATININE  1.09*  1.11*     Time In: 1115 Time Out: 1230 Time Total: 75 min  Greater than 50%  of this time was spent counseling and coordinating care related to the above assessment and plan.  Discussed with  Dr Conley Canal  Signed by: Wadie Lessen, NP  Knox Royalty, NP  10/31/2014, 12:32 PM  Please contact Palliative Medicine Team phone at [phone] for questions and concerns.   See AMION for contact information

## 2014-10-31 NOTE — Progress Notes (Signed)
eLink Physician-Brief Progress Note Patient Name: MEIKA EARLL DOB: 1919-03-15 MRN: [id number]   Date of Service  10/31/2014  HPI/Events of Note  Notified by RN pt being evaluated for possible sepsis & trending LA per sepsis protocol  eICU Interventions  Repeat LA now.     Intervention Category Major Interventions: Sepsis - evaluation and management  Tera Partridge 10/31/2014, 1:44 AM

## 2014-10-31 NOTE — Care Management Note (Signed)
Case Management Note  Patient Details  Name: Katelyn [name] MRN: [id number] Date of Birth: 1918-04-25  Subjective/Objective:    Adm w resp failure                Action/Plan: lives at nsg facility, sw ref made   Expected Discharge Date:                 Expected Discharge Plan:  [place]  In-House Referral:  Clinical Social Work  Discharge planning Services     Post Acute Care Choice:    Choice offered to:     DME Arranged:    DME Agency:     HH Arranged:    [place] Agency:     Status of Service:     Medicare Important Message Given:    Date Medicare IM Given:    Medicare IM give by:    Date Additional Medicare IM Given:    Additional Medicare Important Message give by:     If discussed at [place] of Stay Meetings, dates discussed:    Additional Comments: ur review done  Lacretia Leigh, RN 10/31/2014, 1:57 PM

## 2014-10-31 NOTE — Progress Notes (Signed)
Echocardiogram 2D Echocardiogram with Definity has been performed.  Tresa Res 10/31/2014, 4:22 PM

## 2014-10-31 NOTE — Progress Notes (Signed)
[place] for Heparin Indication: atrial fibrillation  Allergies  Allergen Reactions  . Lactose Intolerance (Gi) Other (See Comments)    UNKNOWN    Patient Measurements: Height: 5\' 2"  (157.5 cm) Weight: 163 lb 5.8 oz (74.1 kg) IBW/kg (Calculated) : 50.1 Heparin Dosing Weight: 66 kg  Vital Signs: Temp: 97.5 F (36.4 C) (08/15 1615) Temp Source: Axillary (08/15 1615) BP: 75/33 mmHg (08/15 1900) Pulse Rate: 94 (08/15 1900)  Labs:  Recent Labs  10/30/14 2049 10/31/14 0231 10/31/14 1632  HGB 11.1* 10.3*  --   HCT 33.6* 31.3*  --   PLT 196 174  --   HEPARINUNFRC  --   --  0.12*  CREATININE 1.09* 1.11*  --     Estimated Creatinine Clearance: 29.8 mL/min (by C-G formula based on Cr of 1.11).    Assessment: 81 y.o. female presents with SOB and atrial fibrillation. Noted remote h/o afib in 2012 - not on AC PTA. Head CT negative for acute process. CBC ok at baseline. To start heparin for afib.  Initial heparin level low at 0.12  Goal of Therapy:  Heparin level 0.3-0.7 units/ml Monitor platelets by anticoagulation protocol: Yes   Plan:  Increase heparin to 1100 units / hr Follow up AM labs  Thank you Anette Guarneri, PharmD [phone] 10/31/2014,7:18 PM

## 2014-10-31 NOTE — Progress Notes (Signed)
ANTICOAGULATION CONSULT NOTE - Initial Consult  Pharmacy Consult for Heparin Indication: atrial fibrillation  Allergies  Allergen Reactions  . Lactose Intolerance (Gi) Other (See Comments)    UNKNOWN    Patient Measurements: Height: 5\' 2"  (157.5 cm) Weight: 163 lb 5.8 oz (74.1 kg) IBW/kg (Calculated) : 50.1 Heparin Dosing Weight: 66 kg  Vital Signs: Temp: 97.6 F (36.4 C) (08/15 0400) Temp Source: Axillary (08/15 0400) BP: 112/50 mmHg (08/15 0400) Pulse Rate: 87 (08/15 0400)  Labs:  Recent Labs  10/30/14 07-14-47  HGB 11.1*  HCT 33.6*  PLT 196  CREATININE 1.09*    Estimated Creatinine Clearance: 30.4 mL/min (by C-G formula based on Cr of 1.09).   Medical History: Past Medical History  Diagnosis Date  . Hypertension   . Hypothyroidism   . Hx of colonic polyps     RLQ mensenteric mass-1.4cmx2.1cm CT 02/20/10 ? Carcinoid  . Hyperlipidemia   . Depression July 14, 1999    following death of spouse  . Hiatal hernia   . Clostridium difficile colitis   . Anxiety   . Anemia   . Parkinson disease   . Hx of adenomatous colonic polyps 06/25/01  . GERD (gastroesophageal reflux disease)   . Carcinoid tumor, malignant     09/03/10 resection; 2/9 positive LN  . Atrial fibrillation     peri/post op 08/2010 - resumed NSR  . Cholelithiasis 07-14-11  . History of blood transfusion 1950    x 6 units  . Hard of hearing   . Cancer      BREAST- right and left/ s/p radiation, tamifoxen    Medications:  Prescriptions prior to admission  Medication Sig Dispense Refill Last Dose  . acetaminophen (TYLENOL) 325 MG tablet Take 650 mg by mouth every 6 (six) hours as needed for moderate pain.   PRN  . amLODipine (NORVASC) 5 MG tablet Take 1 tablet by mouth  daily 90 tablet 1 10/30/2014 at Unknown time  . anastrozole (ARIMIDEX) 1 MG tablet TAKE 1 TABLET BY MOUTH EVERY DAY 90 tablet 2 10/30/2014 at Unknown time  . calcium citrate-vitamin D (CITRACAL+D) 315-200 MG-UNIT per tablet Take 2 tablets by  mouth daily.   10/30/2014 at Unknown time  . Cholecalciferol 1000 UNITS capsule Take 1,000 Units by mouth daily.   10/30/2014 at Unknown time  . levothyroxine (SYNTHROID, LEVOTHROID) 50 MCG tablet TAKE 1 TABLET BY MOUTH EVERY DAY (Patient taking differently: Take 100 mcg by mouth daily before breakfast. ) 90 tablet 1 10/30/2014 at Unknown time  . metoprolol (LOPRESSOR) 50 MG tablet Take 50 mg by mouth 2 (two) times daily.   10/30/2014 at 0800  . nortriptyline (PAMELOR) 10 MG capsule Take 1 capsule (10 mg total) by mouth at bedtime. 30 capsule 5 10/29/2014 at Unknown time  . omeprazole (PRILOSEC) 40 MG capsule Take 1 capsule by mouth  daily 90 capsule 1 10/30/2014 at Unknown time  . dicyclomine (BENTYL) 10 MG capsule Take 1 capsule (10 mg total) by mouth at bedtime. 30 capsule 2   . diphenoxylate-atropine (LOMOTIL) 2.5-0.025 MG per tablet TAKE 1 TABLET BY MOUTH FOUR TIMES DAILY AS NEEDED FOR DIARRHEA/LOOSE STOOL 30 tablet 0 Taking  . metoprolol tartrate (LOPRESSOR) 25 MG tablet Take 1 tablet by mouth two  times daily 90 tablet 3  at 0800    Assessment: 81 y.o. female presents with SOB and atrial fibrillation. Noted remote h/o afib in 07-14-2010 - not on AC PTA. Head CT negative for acute process. CBC ok at baseline. To  start heparin for afib.  Goal of Therapy:  Heparin level 0.3-0.7 units/ml Monitor platelets by anticoagulation protocol: Yes   Plan:  Heparin IV bolus 3500 units Heparin IV gtt 950 units/hr F/u 8 hr heparin level Daily heparin level and CBC  Sherlon Handing, PharmD, BCPS Clinical pharmacist, pager [phone] 10/31/2014,5:31 AM

## 2014-10-31 NOTE — H&P (Signed)
[facility] History and Physical  Katelyn [name] SVX:[id number] DOB: 1918-12-17 DOA: 10/30/2014  Referring physician: Dr.James. PCP: Gwendolyn Grant, MD  Specialists: Dr.Magrinat. Oncologist.  Chief Complaint: Altered mental status.  HPI: Katelyn [name] is a 81 y.o. female with history of hypertension, hyperlipidemia, hypothyroidism, breast cancer, history of carcinoid status post resection in 06-21-2010 and paroxysmal atrial fibrillation not on anticoagulation was brought to the ER after patient became confused and agitated and short of breath. As per the patient's daughter with whom I discussed patient was found to be confused over the last 2 days and was treated with antibiotics for UTI. Yesterday afternoon patient became acutely confused and agitated and short of breath and was brought to the ER. ER patient was found to be in A. fib with RVR and chest x-ray showed bilateral infiltrates concerning for CHF versus pneumonia. X-ray also showed bilateral pleural effusion. Patient was hypoxic and was placed on BiPAP. Due to agitation patient was given morphine for which patient became sedated. Patient was started on empiric antibiotics and is now about to be started on Cardizem infusion for A. fib but patient's heart rate improved. Patient has been admitted for acute respiratory failure and further management. At this time patient has DO NOT RESUSCITATE and family has requested no aggressive measures.   Review of Systems: As presented in the history of presenting illness, rest negative.  Past Medical History  Diagnosis Date  . Hypertension   . Hypothyroidism   . Hx of colonic polyps     RLQ mensenteric mass-1.4cmx2.1cm CT 02/20/10 ? Carcinoid  . Hyperlipidemia   . Depression Jun 21, 1999    following death of spouse  . Hiatal hernia   . Clostridium difficile colitis   . Anxiety   . Anemia   . Parkinson disease   . Hx of adenomatous colonic polyps 06/25/01  . GERD (gastroesophageal reflux  disease)   . Carcinoid tumor, malignant     09/03/10 resection; 2/9 positive LN  . Atrial fibrillation     peri/post op 08/2010 - resumed NSR  . Cholelithiasis 2011/06/21  . History of blood transfusion 1950    x 6 units  . Hard of hearing   . Cancer      BREAST- right and left/ s/p radiation, tamifoxen   Past Surgical History  Procedure Laterality Date  . Abdominal hysterectomy  1969  . Back surgery  1959  . Breast surgery  1995    (R) Lumpectomy  . Mastectomy, partial  06-21-03    left  . Eye surgery      bilateral cataract extraction with IOL,   repair left tear duct  . Colon surgery      resection for carcinoid tumor  . Cholecystectomy  03/25/2012    Procedure: LAPAROSCOPIC CHOLECYSTECTOMY WITH INTRAOPERATIVE CHOLANGIOGRAM;  Surgeon: Edward Jolly, MD;  Location: [facility];  Service: General;  Laterality: N/A;   Social History:  reports that she has never smoked. She has never used smokeless tobacco. She reports that she does not drink alcohol or use illicit drugs. Where does patient live nursing home. Can patient participate in ADLs? Not sure.  Allergies  Allergen Reactions  . Lactose Intolerance (Gi) Other (See Comments)    UNKNOWN    Family History:  Family History  Problem Relation Age of Onset  . Breast cancer Mother   . Hyperlipidemia Mother   . Hypertension Mother   . Colon cancer Mother     ?  . Prostate cancer Father   .  Breast cancer Sister     x'2  . Breast cancer Daughter   . Colon polyps Daughter   . Colon polyps Son   . Colon cancer Father       Prior to Admission medications   Medication Sig Start Date End Date Taking? Authorizing Provider  acetaminophen (TYLENOL) 325 MG tablet Take 650 mg by mouth every 6 (six) hours as needed for moderate pain.   Yes Historical Provider, MD  amLODipine (NORVASC) 5 MG tablet Take 1 tablet by mouth  daily   Yes Rowe Clack, MD  anastrozole (ARIMIDEX) 1 MG tablet TAKE 1 TABLET BY MOUTH EVERY DAY 03/28/14  Yes  Chauncey Cruel, MD  calcium citrate-vitamin D (CITRACAL+D) 315-200 MG-UNIT per tablet Take 2 tablets by mouth daily.   Yes Historical Provider, MD  Cholecalciferol 1000 UNITS capsule Take 1,000 Units by mouth daily.   Yes Historical Provider, MD  levothyroxine (SYNTHROID, LEVOTHROID) 50 MCG tablet TAKE 1 TABLET BY MOUTH EVERY DAY Patient taking differently: Take 100 mcg by mouth daily before breakfast.  04/12/13  Yes Rowe Clack, MD  metoprolol (LOPRESSOR) 50 MG tablet Take 50 mg by mouth 2 (two) times daily.   Yes Historical Provider, MD  nortriptyline (PAMELOR) 10 MG capsule Take 1 capsule (10 mg total) by mouth at bedtime. 08/04/14  Yes Rowe Clack, MD  omeprazole (PRILOSEC) 40 MG capsule Take 1 capsule by mouth  daily   Yes Rowe Clack, MD  dicyclomine (BENTYL) 10 MG capsule Take 1 capsule (10 mg total) by mouth at bedtime. 10/09/12   Lafayette Dragon, MD  diphenoxylate-atropine (LOMOTIL) 2.5-0.025 MG per tablet TAKE 1 TABLET BY MOUTH FOUR TIMES DAILY AS NEEDED FOR DIARRHEA/LOOSE STOOL 06/30/12   Rowe Clack, MD  metoprolol tartrate (LOPRESSOR) 25 MG tablet Take 1 tablet by mouth two  times daily 11/08/13   Rowe Clack, MD    Physical Exam: Filed Vitals:   10/31/14 0021 10/31/14 0027 10/31/14 0030 10/31/14 0039  BP:   106/50   Pulse: 98 89 94 85  Temp: 99.3 F (37.4 C) 99.3 F (37.4 C) 99.3 F (37.4 C) 99.3 F (37.4 C)  TempSrc:      Resp: 26 19 30 30   Height:      Weight:      SpO2: 88% 89%  90%     General:  Moderately built and nourished.  Eyes: No discharge seen.  ENT: No discharge from the ears nose or mouth.  Neck: No mass felt.  Cardiovascular: S1-S2 heard.  Respiratory: No rhonchi or crepitations.  Abdomen: Soft nontender bowel sounds present.  Skin: No rash.  Musculoskeletal: Mild edema of the both lower extremities.  Psychiatric: Patient is sedated.  Neurologic: Patient is sedated and minimally responsive.  Labs on  Admission:  Basic Metabolic Panel:  Recent Labs Lab 10/30/14 2049  NA 122*  K 3.7  CL 89*  CO2 20*  GLUCOSE 152*  BUN 23*  CREATININE 1.09*  CALCIUM 8.6*   Liver Function Tests:  Recent Labs Lab 10/30/14 2049  AST 35  ALT 43  ALKPHOS 97  BILITOT 0.6  PROT 6.9  ALBUMIN 3.4*   No results for input(s): LIPASE, AMYLASE in the last 168 hours. No results for input(s): AMMONIA in the last 168 hours. CBC:  Recent Labs Lab 10/30/14 2049  WBC 11.7*  NEUTROABS 9.9*  HGB 11.1*  HCT 33.6*  MCV 87.5  PLT 196   Cardiac Enzymes: No results for  input(s): CKTOTAL, CKMB, CKMBINDEX, TROPONINI in the last 168 hours.  BNP (last 3 results)  Recent Labs  10/30/14 2232  BNP 743.7*    ProBNP (last 3 results) No results for input(s): PROBNP in the last 8760 hours.  CBG: No results for input(s): GLUCAP in the last 168 hours.  Radiological Exams on Admission: Dg Chest Port 1 View  10/30/2014   CLINICAL DATA:  Code sepsis. Acute onset of shortness of breath and atrial fibrillation. Initial encounter.  EXAM: PORTABLE CHEST - 1 VIEW  COMPARISON:  Chest radiograph from 03/17/2012  FINDINGS: The lungs are hypoexpanded. Small bilateral pleural effusions are seen, right greater than left. Bilateral airspace opacities may reflect pulmonary edema or possibly pneumonia. There is no evidence of pneumothorax.  The cardiomediastinal silhouette is mildly enlarged. No acute osseous abnormalities are seen.  IMPRESSION: Lungs hypoexpanded. Small bilateral pleural effusions, right greater than left. Bilateral airspace opacities may reflect pulmonary edema or possibly pneumonia. Mild cardiomegaly noted.   Electronically Signed   By: Garald Balding M.D.   On: 10/30/2014 21:19    EKG: Independently reviewed. A. fib with RVR.  Assessment/Plan Active Problems:   Hypothyroidism   Essential hypertension   Acute respiratory failure with hypoxia   Congestive heart disease   Atrial fibrillation with  RVR   Acute respiratory failure   Acute encephalopathy   1. Acute respiratory failure with hypoxia - differential includes CHF versus pneumonia. Patient's blood pressure is in the low normal so I have not ordered any Lasix. For possible pneumonia patient has been placed on empiric antibiotics. Check pro-calcitonin levels. Patient's family at this time has not requested any aggressive measures. If patient's condition does not improve then may change to comfort measures only. Palliative team has been consulted. 2. A. fib with RVR - for now I have placed patient on heparin if CT head is negative for any bleed. If patient's condition does not improve then patient's family is requested for full comfort measures. 3. UTI - patient is on empiric antibiotics follow cultures. 4. Acute encephalopathy most likely secondary to UTI and #1. CT head is pending. 5. Hypothyroidism on Synthroid. 6. Hypertension presently blood pressure is in the low normals. 7. History of breast cancer on anastrozole. 8. History of carcinoid status post resection in 2012.  I have reviewed patient's old charts and labs. Personally reviewed patient's chest x-ray and EKG. I have discussed patient's daughter and granddaughter.   DVT Prophylaxis heparin.  Code Status: DO NOT RESUSCITATE.  Family Communication: Discussed with daughter and granddaughter.  Disposition Plan: Admit to inpatient.    Francois Elk N. [facility] Pager [phone].  If 7PM-7AM, please contact night-coverage [email or web address] Password [email or web address] 10/31/2014, 1:39 AM

## 2014-10-31 NOTE — Progress Notes (Signed)
Patient arrived from ED with Bi-Pap on, family at bedside and stated again that the patient was a DNR. Patient not responding after Morphine given in ED for agitation.  Patient bathed with CHG cloths and Mrsa swab obtained and sent to lab. Patient not responding during bath. Patient remains in Afib with good rate control 100's. VSS see flowsheet. Daughter stated patient has coughing spells when drinking water and thin liquids at times. Will recommend swallowing study once patient is responding. Patient NPO presently. Will continue to monitor closely.

## 2014-10-31 NOTE — ED Notes (Signed)
Dr. Kakrakandy at bedside. 

## 2014-10-31 NOTE — Progress Notes (Addendum)
Asked by Katelyn [name] to meet with family.  Chart reviewed. Patient examined. Unresponsive.  Per Dr. Moise Boring note, The plan was to treat pneumonia (BP too low for lasix if CHF).  Treat with bipap, get CT brain and consider transitioning to comfort care if no improvement.  Patient remains largely unresponsive, minimal urine output. Blood pressure ranging 70-90/30s.  Prognosis appears poor.  No h/o CHF and no echo on record.  procalcitonin <0.10, BNP 743. Unclear whether mainly CHF, pneumonia or both. TSH normal. CT brain without anything acute.  Discussed findings with family.  Living will on chart. Pt desires no life prolonging therapy if vegetative or no chance of meaningful recovery.  Most family members leaning toward transitioning to comfort.  Some say she wouldn't want any further testing at this point. the patient went though same when her husband die and make similar end of life decisions. One son hostile and disagrees with comfort care.  They would like to get echo and continue current care, DNR.  They will continue to discuss goals of care today and after echo result. Time 60 minutes.  Doree Barthel, MD [facility]

## 2014-10-31 NOTE — ED Notes (Signed)
Contacted respiratory for transport

## 2014-10-31 NOTE — Progress Notes (Signed)
Family stated patient lives at [facility] in [place], [place]. Family stated they would like for patient to be returned to that facility when discharged.

## 2014-11-01 DIAGNOSIS — G9340 Encephalopathy, unspecified: Secondary | ICD-10-CM

## 2014-11-01 DIAGNOSIS — I4891 Unspecified atrial fibrillation: Secondary | ICD-10-CM

## 2014-11-01 DIAGNOSIS — Z7189 Other specified counseling: Secondary | ICD-10-CM

## 2014-11-01 DIAGNOSIS — J9601 Acute respiratory failure with hypoxia: Secondary | ICD-10-CM

## 2014-11-01 LAB — CBC
HCT: 32.1 % — ABNORMAL LOW (ref 36.0–46.0)
Hemoglobin: 10.5 g/dL — ABNORMAL LOW (ref 12.0–15.0)
MCH: 28.8 pg (ref 26.0–34.0)
MCHC: 32.7 g/dL (ref 30.0–36.0)
MCV: 88.2 fL (ref 78.0–100.0)
PLATELETS: 156 10*3/uL (ref 150–400)
RBC: 3.64 MIL/uL — ABNORMAL LOW (ref 3.87–5.11)
RDW: 15 % (ref 11.5–15.5)
WBC: 13.2 10*3/uL — AB (ref 4.0–10.5)

## 2014-11-01 LAB — GLUCOSE, CAPILLARY
Glucose-Capillary: 115 mg/dL — ABNORMAL HIGH (ref 65–99)
Glucose-Capillary: 74 mg/dL (ref 65–99)
Glucose-Capillary: 88 mg/dL (ref 65–99)

## 2014-11-01 LAB — HEPARIN LEVEL (UNFRACTIONATED)
HEPARIN UNFRACTIONATED: 0.94 [IU]/mL — AB (ref 0.30–0.70)
HEPARIN UNFRACTIONATED: 0.6 [IU]/mL (ref 0.30–0.70)
Heparin Unfractionated: 0.79 IU/mL — ABNORMAL HIGH (ref 0.30–0.70)

## 2014-11-01 LAB — STREP PNEUMONIAE URINARY ANTIGEN: Strep Pneumo Urinary Antigen: NEGATIVE

## 2014-11-01 MED ORDER — FUROSEMIDE 10 MG/ML IJ SOLN
20.0000 mg | Freq: Once | INTRAMUSCULAR | Status: AC
  Administered 2014-11-01: 20 mg via INTRAVENOUS

## 2014-11-01 MED ORDER — MORPHINE SULFATE (PF) 2 MG/ML IV SOLN
INTRAVENOUS | Status: AC
  Filled 2014-11-01: qty 1

## 2014-11-01 MED ORDER — FUROSEMIDE 10 MG/ML IJ SOLN: 20.0000 mg | Freq: Two times a day (BID) | INTRAMUSCULAR | Status: DC

## 2014-11-01 MED ORDER — MORPHINE SULFATE (PF) 2 MG/ML IV SOLN
2.0000 mg | Freq: Once | INTRAVENOUS | Status: AC
  Administered 2014-11-01: 2 mg via INTRAVENOUS

## 2014-11-01 MED ORDER — FUROSEMIDE 10 MG/ML IJ SOLN
INTRAMUSCULAR | Status: AC
  Filled 2014-11-01: qty 2

## 2014-11-01 NOTE — Progress Notes (Signed)
Daily Progress Note   Patient Name: Katelyn [name]       Date: 11/01/2014 DOB: 11/09/1918  Age: 81 y.o. MRN#: [id number] Attending Physician: Geradine Girt, DO Primary Care Physician: Gwendolyn Grant, MD Admit Date: 10/30/2014  Reason for Consultation/Follow-up: Establishing goals of care and Psychosocial/spiritual support  Subjective:     -continued conversation with two daughters at bedside concerning diagnosis, prognosis, GOC and options.  Questions and concern addressed  -both express appreciation for care patietn is receiving during this hospitalization, they verbalize difficulty some members of family are having with processing this patient's mortality and long term poor prognosis    Length of Stay: 2 days  Current Medications: Scheduled Meds:  . aztreonam  500 mg Intravenous Q8H  . levothyroxine  50 mcg Intravenous Daily  . sodium chloride  3 mL Intravenous Q12H  . vancomycin  750 mg Intravenous Q24H    Continuous Infusions: . heparin 950 Units/hr (11/01/14 0414)    PRN Meds: acetaminophen **OR** acetaminophen, ondansetron **OR** ondansetron (ZOFRAN) IV  Palliative Performance Scale: 30 % at best     Vital Signs: BP 88/64 mmHg  Pulse 126  Temp(Src) 97.3 F (36.3 C) (Oral)  Resp 14  Ht 5\' 2"  (1.575 m)  Wt 71.4 kg (157 lb 6.5 oz)  BMI 28.78 kg/m2  SpO2 99% SpO2: SpO2: 99 % O2 Device: O2 Device: Nasal Cannula O2 Flow Rate: O2 Flow Rate (L/min): 4 L/min  Intake/output summary:  Intake/Output Summary (Last 24 hours) at 11/01/14 1530 Last data filed at 11/01/14 1500  Gross per 24 hour  Intake 781.78 ml  Output   1620 ml  Net -838.22 ml   LBM:   Baseline Weight: Weight: 69.4 kg (153 lb) Most recent weight: Weight: 71.4 kg (157 lb 6.5 oz)  Physical Exam:              General: ill appearing, opens eyes to name, generally uncomforatble HEENT: dry buccal membranes, no exudate CVS: tachycardic Resp: tacycepnic  Abd: soft NT +BS Skin: warm and  dry   Additional Data Reviewed: Recent Labs     10/30/14  2049  10/31/14  0231  11/01/14  0256  WBC  11.7*  14.8*  13.2*  HGB  11.1*  10.3*  10.5*  PLT  196  174  156  NA  122*  125*   --   BUN  23*  25*   --   CREATININE  1.09*  1.11*   --      Problem List:  Patient Active Problem List   Diagnosis Date Noted  . Acute respiratory failure with hypoxia 10/31/2014  . Congestive heart disease 10/31/2014  . Atrial fibrillation with RVR 10/31/2014  . Acute respiratory failure 10/31/2014  . Palliative care encounter 10/31/2014  . DNR (do not resuscitate) discussion 10/31/2014  . Acute encephalopathy   . A-fib 10/30/2014  . Breast cancer of upper-inner quadrant of right female breast 12/08/2012  . Breast cancer of lower-outer quadrant of left female breast 12/08/2012  . Suprapubic pain 11/26/2012  . Painful bladder spasm 11/26/2012  . Urinary frequency 08/14/2012  . Carcinoid tumor of small intestine, malignant 05/31/2011  . ANEMIA 05/02/2010  . ANXIETY 05/02/2010  . DEPRESSION 05/02/2010  . PARKINSON'S DISEASE 05/02/2010  . Diarrhea 05/02/2010  . COLONIC POLYPS, ADENOMATOUS, HX OF 05/02/2010  . OVERACTIVE BLADDER 04/01/2010  . Malignant neoplasm of female breast 03/07/2010  . HYPERLIPIDEMIA 03/07/2010  . GERD 03/07/2010  . DIVERTICULOSIS, COLON 03/07/2010  .  Hypothyroidism 03/06/2010  . Essential hypertension 03/06/2010     Palliative Care Assessment & Plan    Code Status:  DNR  Goals of Care:  Treat the treatable, family remains hopeful for improvement   Care plan was discussed with Dr Eliseo Squires  Thank you for allowing the Palliative Medicine Team to assist in the care of this patient.   Time In: 0815 Time Out: 0850 Total Time 35 min Prolonged Time Billed  no     Greater than 50%  of this time was spent counseling and coordinating care related to the above assessment and plan.     Knox Royalty, NP  11/01/2014, 3:30 PM  Please contact Palliative  Medicine Team phone at [phone] for questions and concerns.

## 2014-11-01 NOTE — Clinical Documentation Improvement (Signed)
Please specify diagnosis related to below supporting information, if appropriate.   Possible Clinical Conditions? Chronic Systolic Congestive Heart Failure Chronic Diastolic Congestive Heart Failure Chronic Systolic & Diastolic Congestive Heart Failure Acute Systolic Congestive Heart Failure Acute Diastolic Congestive Heart Failure Acute Systolic & Diastolic Congestive Heart Failure Acute on Chronic Systolic Congestive Heart Failure Acute on Chronic Diastolic Congestive Heart Failure Acute on Chronic Systolic & Diastolic Congestive Heart Failure Other Condition________________________________________ Cannot Clinically Determine  Supporting Information: Per 10/30/13 H&P = Congestive heart disease.   Acute respiratory failure with hypoxia - differential includes CHF versus pneumonia. Patient's blood pressure is in the low normal so I have not ordered any Lasix. For possible pneumonia patient has been placed on empiric antibiotics. Check pro-calcitonin levels.   Per 10/30/14 ED Provider note = On initial exam and initial studies, it is not evident to me if this is Pneumonia, or CHF. Pt However, is not febrile, and no leukocytosis. Does have New dependent edema, and new AF c RVR.   Congestive heart failure, unspecified congestive heart failure chronicity, unspecified congestive heart failure type.       Echo completed on 10/31/14.  Thank You, Serena Colonel ,RN Clinical Documentation Specialist:  [employer]

## 2014-11-01 NOTE — Progress Notes (Signed)
Patient is tolerating 4L O2 [place] at this time. Patient does not need BIPAP at this time. RT will continue to monitor as needed

## 2014-11-01 NOTE — Progress Notes (Addendum)
PROGRESS NOTE  Katelyn [name] JME:[id number] DOB: 07-08-1918 DOA: 10/30/2014 PCP: Gwendolyn Grant, MD  Assessment/Plan: Acute respiratory failure with hypoxia - differential includes pulm edema due to a fib versus pneumonia.  -For possible pneumonia patient has been placed on empiric antibiotics.  pro-calcitonin levels low Patient's family at this time has not requested any aggressive measures. If patient's condition does not improve then may change to comfort measures only. Palliative team has been consulted. -will try 1 dose of IV lasix and start BID if BP and CR tolerated and patient has improvement  A. fib with RVR -  -heparin gtt  UTI - patient is on empiric antibiotics follow cultures.  Acute encephalopathy- patient waking up some  Hypothyroidism on Synthroid.  Hypertension presently blood pressure is in the low normals.  History of breast cancer on anastrozole.  History of carcinoid status post resection in 2012.  Hyponatremia -? Volume overload  Code Status: DNR Family Communication: daughters at bedside Disposition Plan:    Consultants:  Palliative care  Procedures:      HPI/Subjective: Pulling at [place]- trying to communicate  Objective: Filed Vitals:   11/01/14 0845  BP: 98/55  Pulse: 115  Temp:   Resp: 22    Intake/Output Summary (Last 24 hours) at 11/01/14 0921 Last data filed at 11/01/14 0800  Gross per 24 hour  Intake 772.28 ml  Output    880 ml  Net -107.72 ml   Filed Weights   10/30/14 2049 10/31/14 0225 11/01/14 0300  Weight: 69.4 kg (153 lb) 74.1 kg (163 lb 5.8 oz) 71.4 kg (157 lb 6.5 oz)    Exam:   General:  Ill appearing, on bipap  Cardiovascular: irr  Respiratory: coarse, diminshed  Abdomen: +BS, soft  Musculoskeletal: + LE edema   Data Reviewed: Basic Metabolic Panel:  Recent Labs Lab 10/30/14 2049 10/31/14 0231  NA 122* 125*  K 3.7 3.8  CL 89* 92*  CO2 20* 25  GLUCOSE 152* 85  BUN 23* 25*    CREATININE 1.09* 1.11*  CALCIUM 8.6* 8.3*   Liver Function Tests:  Recent Labs Lab 10/30/14 2049 10/31/14 0231  AST 35 32  ALT 43 38  ALKPHOS 97 84  BILITOT 0.6 0.6  PROT 6.9 5.8*  ALBUMIN 3.4* 2.9*   No results for input(s): LIPASE, AMYLASE in the last 168 hours. No results for input(s): AMMONIA in the last 168 hours. CBC:  Recent Labs Lab 10/30/14 2049 10/31/14 0231 11/01/14 0256  WBC 11.7* 14.8* 13.2*  NEUTROABS 9.9* 11.7*  --   HGB 11.1* 10.3* 10.5*  HCT 33.6* 31.3* 32.1*  MCV 87.5 88.2 88.2  PLT 196 174 156   Cardiac Enzymes: No results for input(s): CKTOTAL, CKMB, CKMBINDEX, TROPONINI in the last 168 hours. BNP (last 3 results)  Recent Labs  10/30/14 2232  BNP 743.7*    ProBNP (last 3 results) No results for input(s): PROBNP in the last 8760 hours.  CBG:  Recent Labs Lab 10/31/14 0802 10/31/14 1154 10/31/14 2353 11/01/14 0633  GLUCAP 92 73 88 74    Recent Results (from the past 240 hour(s))  Urine culture     Status: None (Preliminary result)   Collection Time: 10/30/14  8:45 PM  Result Value Ref Range Status   Specimen Description URINE, CATHETERIZED  Final   Special Requests NONE  Final   Culture TOO YOUNG TO READ  Final   Report Status PENDING  Incomplete  Blood Culture (routine x 2)     Status:  None (Preliminary result)   Collection Time: 10/30/14  8:49 PM  Result Value Ref Range Status   Specimen Description BLOOD LEFT ANTECUBITAL  Final   Special Requests BOTTLES DRAWN AEROBIC AND ANAEROBIC 5CC   Final   Culture NO GROWTH < 24 HOURS  Final   Report Status PENDING  Incomplete  Blood Culture (routine x 2)     Status: None (Preliminary result)   Collection Time: 10/30/14  9:01 PM  Result Value Ref Range Status   Specimen Description BLOOD RIGHT HAND  Final   Special Requests BOTTLES DRAWN AEROBIC AND ANAEROBIC 5CC   Final   Culture NO GROWTH < 24 HOURS  Final   Report Status PENDING  Incomplete  MRSA PCR Screening     Status:  None   Collection Time: 10/31/14  1:20 AM  Result Value Ref Range Status   MRSA by PCR NEGATIVE NEGATIVE Final    Comment:        The GeneXpert MRSA Assay (FDA approved for NASAL specimens only), is one component of a comprehensive MRSA colonization surveillance program. It is not intended to diagnose MRSA infection nor to guide or monitor treatment for MRSA infections.      Studies: Ct Head Wo Contrast  10/31/2014   CLINICAL DATA:  Acute encephalopathy.  Unresponsive  EXAM: CT HEAD WITHOUT CONTRAST  TECHNIQUE: Contiguous axial images were obtained from the base of the skull through the vertex without intravenous contrast.  COMPARISON:  09/24/2008  FINDINGS: Skull and Sinuses:Negative for fracture or destructive process. No sinusitis or mastoiditis  Orbits: Bilateral cataract resection. Prominent superior ophthalmic veins, usually transient venous distention.  Brain: No evidence of acute infarction, hemorrhage, hydrocephalus, or mass lesion/mass effect. There is generalized atrophy which has mildly progressed from 2010. Minimal low-density around the lateral ventricles, expected for age.  IMPRESSION: 1. No acute intracranial finding. 2. Senescent findings with expected progression from 2010.   Electronically Signed   By: Monte Fantasia M.D.   On: 10/31/2014 04:49   Dg Chest Port 1 View  10/30/2014   CLINICAL DATA:  Code sepsis. Acute onset of shortness of breath and atrial fibrillation. Initial encounter.  EXAM: PORTABLE CHEST - 1 VIEW  COMPARISON:  Chest radiograph from 03/17/2012  FINDINGS: The lungs are hypoexpanded. Small bilateral pleural effusions are seen, right greater than left. Bilateral airspace opacities may reflect pulmonary edema or possibly pneumonia. There is no evidence of pneumothorax.  The cardiomediastinal silhouette is mildly enlarged. No acute osseous abnormalities are seen.  IMPRESSION: Lungs hypoexpanded. Small bilateral pleural effusions, right greater than left.  Bilateral airspace opacities may reflect pulmonary edema or possibly pneumonia. Mild cardiomegaly noted.   Electronically Signed   By: Garald Balding M.D.   On: 10/30/2014 21:19    Scheduled Meds: . aztreonam  500 mg Intravenous Q8H  . levothyroxine  50 mcg Intravenous Daily  . morphine      . sodium chloride  3 mL Intravenous Q12H  . vancomycin  750 mg Intravenous Q24H   Continuous Infusions: . heparin 950 Units/hr (11/01/14 0414)   Antibiotics Given (last 72 hours)    Date/Time Action Medication Dose Rate   10/30/14 2158 Given   vancomycin (VANCOCIN) IVPB 750 mg/150 ml premix 750 mg 150 mL/hr   10/31/14 2111 Given   vancomycin (VANCOCIN) IVPB 750 mg/150 ml premix 750 mg 150 mL/hr      Principal Problem:   Acute respiratory failure with hypoxia Active Problems:   Hypothyroidism   Essential hypertension  Congestive heart disease   Atrial fibrillation with RVR   Acute respiratory failure   Acute encephalopathy   Palliative care encounter   DNR (do not resuscitate) discussion    Time spent: 35 min    Novelle Addair  [facility] Pager [phone]. If 7PM-7AM, please contact night-coverage at [email or web address], password [email or web address] 11/01/2014, 9:21 AM  LOS: 2 days

## 2014-11-01 NOTE — Progress Notes (Signed)
[place] for Heparin Indication: atrial fibrillation  Allergies  Allergen Reactions  . Lactose Intolerance (Gi) Other (See Comments)    UNKNOWN    Patient Measurements: Height: 5\' 2"  (157.5 cm) Weight: 157 lb 6.5 oz (71.4 kg) IBW/kg (Calculated) : 50.1 Heparin Dosing Weight: 66 kg  Vital Signs: Temp: 98 F (36.7 C) (08/16 2000) Temp Source: Oral (08/16 2000) BP: 116/51 mmHg (08/16 2000) Pulse Rate: 143 (08/16 2000)  Labs:  Recent Labs  10/30/14 2049 10/31/14 0231  11/01/14 0256 11/01/14 1230 11/01/14 2058  HGB 11.1* 10.3*  --  10.5*  --   --   HCT 33.6* 31.3*  --  32.1*  --   --   PLT 196 174  --  156  --   --   HEPARINUNFRC  --   --   < > 0.94* 0.60 0.79*  CREATININE 1.09* 1.11*  --   --   --   --   < > = values in this interval not displayed.  Estimated Creatinine Clearance: 29.3 mL/min (by C-G formula based on Cr of 1.11).   Assessment: 81 y.o. female presents with SOB and atrial fibrillation. Noted remote h/o afib in 2012 - not on AC PTA.  Noted plans for possible comfort care. PM HL elevated at 0.79   Goal of Therapy:  Heparin level 0.3-0.7 units/ml Monitor platelets by anticoagulation protocol: Yes   Plan:  Decrease heparin to 850 units / hr Daily heparin level and CBC  Thank you Anette Guarneri, PharmD [phone]   11/01/2014 9:50 PM

## 2014-11-01 NOTE — Progress Notes (Signed)
[place] for Heparin Indication: atrial fibrillation  Allergies  Allergen Reactions  . Lactose Intolerance (Gi) Other (See Comments)    UNKNOWN    Patient Measurements: Height: 5\' 2"  (157.5 cm) Weight: 157 lb 6.5 oz (71.4 kg) IBW/kg (Calculated) : 50.1 Heparin Dosing Weight: 66 kg  Vital Signs: Temp: 98.1 F (36.7 C) (08/15 2354) Temp Source: Oral (08/15 2354) BP: 91/44 mmHg (08/16 0335) Pulse Rate: 114 (08/16 0335)  Labs:  Recent Labs  10/30/14 2049 10/31/14 0231 10/31/14 1632 11/01/14 0256  HGB 11.1* 10.3*  --  10.5*  HCT 33.6* 31.3*  --  32.1*  PLT 196 174  --  156  HEPARINUNFRC  --   --  0.12* 0.94*  CREATININE 1.09* 1.11*  --   --     Estimated Creatinine Clearance: 29.3 mL/min (by C-G formula based on Cr of 1.11).   Assessment: 81 y.o. female presents with SOB and atrial fibrillation. Noted remote h/o afib in 2012 - not on AC PTA. Pt continues on heparin. Heparin level 0.94 (supratherapeutic) on 1100 units/hr. Large increase in heparin level even though appropriate rate gtt increase -?accumulation in elderly patient. CBC ok. No issues with line or bleeding per RN.  Goal of Therapy:  Heparin level 0.3-0.7 units/ml Monitor platelets by anticoagulation protocol: Yes   Plan:  Decrease heparin to 950 units / hr F/u 8 hr heparin level  Sherlon Handing, PharmD, BCPS Clinical pharmacist, pager [phone] 11/01/2014,4:07 AM

## 2014-11-01 NOTE — Progress Notes (Signed)
Bipap taken off patient by RN at 1045, pt tolerating well, no distress noted, RT will monitor

## 2014-11-01 NOTE — Progress Notes (Signed)
[place] for Heparin Indication: atrial fibrillation  Allergies  Allergen Reactions  . Lactose Intolerance (Gi) Other (See Comments)    UNKNOWN    Patient Measurements: Height: 5\' 2"  (157.5 cm) Weight: 157 lb 6.5 oz (71.4 kg) IBW/kg (Calculated) : 50.1 Heparin Dosing Weight: 66 kg  Vital Signs: Temp: 97.3 F (36.3 C) (08/16 0409) Temp Source: Oral (08/16 0409) BP: 88/64 mmHg (08/16 1500) Pulse Rate: 126 (08/16 1500)  Labs:  Recent Labs  10/30/14 2049 10/31/14 0231 10/31/14 1632 11/01/14 0256 11/01/14 1230  HGB 11.1* 10.3*  --  10.5*  --   HCT 33.6* 31.3*  --  32.1*  --   PLT 196 174  --  156  --   HEPARINUNFRC  --   --  0.12* 0.94* 0.60  CREATININE 1.09* 1.11*  --   --   --     Estimated Creatinine Clearance: 29.3 mL/min (by C-G formula based on Cr of 1.11).   Assessment: 81 y.o. female presents with SOB and atrial fibrillation. Noted remote h/o afib in 2012 - not on AC PTA. Pt continues on heparin. (Heparin level at goal; HL= 0.6) on 950 units/hr. Noted plans for possible comfort care   Goal of Therapy:  Heparin level 0.3-0.7 units/ml Monitor platelets by anticoagulation protocol: Yes   Plan:  -No heparin changes needed -Confirm heparin level later today -Daily heparin level and CBC  Hildred Laser, Pharm D 11/01/2014 3:50 PM

## 2014-11-02 DIAGNOSIS — E039 Hypothyroidism, unspecified: Secondary | ICD-10-CM

## 2014-11-02 LAB — URINE CULTURE: Culture: >=100000

## 2014-11-02 LAB — BASIC METABOLIC PANEL
ANION GAP: 10 (ref 5–15)
BUN: 33 mg/dL — ABNORMAL HIGH (ref 6–20)
CO2: 22 mmol/L (ref 22–32)
Calcium: 8.2 mg/dL — ABNORMAL LOW (ref 8.9–10.3)
Chloride: 99 mmol/L — ABNORMAL LOW (ref 101–111)
Creatinine, Ser: 0.91 mg/dL (ref 0.44–1.00)
GFR, EST NON AFRICAN AMERICAN: 53 mL/min — AB (ref >60)
Glucose, Bld: 104 mg/dL — ABNORMAL HIGH (ref 65–99)
POTASSIUM: 4.1 mmol/L (ref 3.5–5.1)
SODIUM: 131 mmol/L — AB (ref 135–145)

## 2014-11-02 LAB — GLUCOSE, CAPILLARY
GLUCOSE-CAPILLARY: 115 mg/dL — AB (ref 65–99)
GLUCOSE-CAPILLARY: 120 mg/dL — AB (ref 65–99)
GLUCOSE-CAPILLARY: 151 mg/dL — AB (ref 65–99)
Glucose-Capillary: 122 mg/dL — ABNORMAL HIGH (ref 65–99)

## 2014-11-02 LAB — HEPARIN LEVEL (UNFRACTIONATED)
HEPARIN UNFRACTIONATED: 0.52 [IU]/mL (ref 0.30–0.70)
HEPARIN UNFRACTIONATED: 0.41 [IU]/mL (ref 0.30–0.70)

## 2014-11-02 LAB — CBC
HEMATOCRIT: 31.4 % — AB (ref 36.0–46.0)
HEMOGLOBIN: 10 g/dL — AB (ref 12.0–15.0)
MCH: 28.6 pg (ref 26.0–34.0)
MCHC: 31.8 g/dL (ref 30.0–36.0)
MCV: 89.7 fL (ref 78.0–100.0)
Platelets: 159 10*3/uL (ref 150–400)
RBC: 3.5 MIL/uL — ABNORMAL LOW (ref 3.87–5.11)
RDW: 15.2 % (ref 11.5–15.5)
WBC: 11.7 10*3/uL — AB (ref 4.0–10.5)

## 2014-11-02 LAB — OCCULT BLOOD X 1 CARD TO LAB, STOOL: Fecal Occult Bld: POSITIVE — AB

## 2014-11-02 LAB — LEGIONELLA ANTIGEN, URINE

## 2014-11-02 MED ORDER — LEVOFLOXACIN IN D5W 750 MG/150ML IV SOLN
750.0000 mg | INTRAVENOUS | Status: DC
  Filled 2014-11-02: qty 150

## 2014-11-02 MED ORDER — MORPHINE SULFATE (PF) 2 MG/ML IV SOLN
1.0000 mg | Freq: Once | INTRAVENOUS | Status: AC
  Administered 2014-11-02: 1 mg via INTRAVENOUS

## 2014-11-02 MED ORDER — METOPROLOL TARTRATE 1 MG/ML IV SOLN
5.0000 mg | Freq: Four times a day (QID) | INTRAVENOUS | Status: DC
  Administered 2014-11-02 (×2): 5 mg via INTRAVENOUS
  Filled 2014-11-02 (×2): qty 5

## 2014-11-02 MED ORDER — PIPERACILLIN-TAZOBACTAM 3.375 G IVPB
3.3750 g | Freq: Three times a day (TID) | INTRAVENOUS | Status: DC
  Administered 2014-11-02 – 2014-11-03 (×3): 3.375 g via INTRAVENOUS
  Filled 2014-11-02 (×5): qty 50

## 2014-11-02 MED ORDER — DILTIAZEM HCL 100 MG IV SOLR
5.0000 mg/h | INTRAVENOUS | Status: DC
  Administered 2014-11-02: 5 mg/h via INTRAVENOUS
  Filled 2014-11-02: qty 100

## 2014-11-02 MED ORDER — METOPROLOL TARTRATE 1 MG/ML IV SOLN
2.5000 mg | Freq: Once | INTRAVENOUS | Status: AC
  Administered 2014-11-02: 2.5 mg via INTRAVENOUS
  Filled 2014-11-02: qty 5

## 2014-11-02 MED ORDER — AMIODARONE HCL IN DEXTROSE 360-4.14 MG/200ML-% IV SOLN
INTRAVENOUS | Status: AC
  Filled 2014-11-02: qty 200

## 2014-11-02 MED ORDER — HALOPERIDOL LACTATE 5 MG/ML IJ SOLN
2.0000 mg | Freq: Four times a day (QID) | INTRAMUSCULAR | Status: DC | PRN
  Administered 2014-11-02: 2 mg via INTRAVENOUS
  Filled 2014-11-02: qty 1

## 2014-11-02 MED ORDER — AMIODARONE LOAD VIA INFUSION
150.0000 mg | Freq: Once | INTRAVENOUS | Status: AC
  Administered 2014-11-02: 150 mg via INTRAVENOUS
  Filled 2014-11-02: qty 83.34

## 2014-11-02 MED ORDER — AMIODARONE HCL IN DEXTROSE 360-4.14 MG/200ML-% IV SOLN
30.0000 mg/h | INTRAVENOUS | Status: DC
  Administered 2014-11-03: 30 mg/h via INTRAVENOUS
  Filled 2014-11-02 (×3): qty 200

## 2014-11-02 MED ORDER — MORPHINE SULFATE (PF) 2 MG/ML IV SOLN
INTRAVENOUS | Status: AC
  Filled 2014-11-02: qty 1

## 2014-11-02 MED ORDER — MORPHINE SULFATE (PF) 2 MG/ML IV SOLN
1.0000 mg | INTRAVENOUS | Status: DC | PRN
  Administered 2014-11-02: 1 mg via INTRAVENOUS
  Filled 2014-11-02: qty 1

## 2014-11-02 MED ORDER — AMIODARONE HCL IN DEXTROSE 360-4.14 MG/200ML-% IV SOLN
60.0000 mg/h | INTRAVENOUS | Status: AC
  Administered 2014-11-02 (×2): 60 mg/h via INTRAVENOUS
  Filled 2014-11-02: qty 200

## 2014-11-02 NOTE — Progress Notes (Signed)
Heart rate is on 170's afib despite lopressor 5 mg iv x2 doses given at separate time,  Pt. Become restless and agitated, md aware, haldol 2 mg iv given and started on cardizem gtt at  5 mg/hr  and titrated to 15 mg/hr. Still hr on 150's. afib.

## 2014-11-02 NOTE — Progress Notes (Signed)
1945-MD made aware of unable to get 2nd iv access for heparin infusion (IV team unable to get access) and pt's positive hemoccult stool.  2030 MD called back with orders. Wants to restart heparin.Will try to get 2nd iv site access.  2130 2nd iv access obtained heparin restarted at previous rate. Pharmacy made aware. Will continue to monitor.

## 2014-11-02 NOTE — Care Management Important Message (Signed)
Important Message  Patient Details  Name: MAUDINE KLUESNER MRN: [id number] Date of Birth: February 23, 1919   Medicare Important Message Given:  Yes-second notification given    Pricilla Handler 11/02/2014, 11:51 AM

## 2014-11-02 NOTE — Progress Notes (Signed)
Attempted by IV team to restart iv line for heparin gtt but unsuccessful, stool for ob -+ endorsed to incoming shift.

## 2014-11-02 NOTE — Progress Notes (Signed)
ANTICOAGULATION CONSULT NOTE - Follow Up Consult  Pharmacy Consult for Heparin  Indication: atrial fibrillation  Allergies  Allergen Reactions  . Lactose Intolerance (Gi) Other (See Comments)    UNKNOWN    Patient Measurements: Height: 5\' 2"  (157.5 cm) Weight: 156 lb 12 oz (71.1 kg) IBW/kg (Calculated) : 50.1  Vital Signs: Temp: 98 F (36.7 C) (08/17 1142) Temp Source: Oral (08/17 1142) BP: 116/73 mmHg (08/17 1142) Pulse Rate: 139 (08/17 1142)  Labs:  Recent Labs  10/30/14 2049 10/31/14 0231  11/01/14 0256  11/01/14 2058 11/02/14 0236 11/02/14 1035  HGB 11.1* 10.3*  --  10.5*  --   --  10.0*  --   HCT 33.6* 31.3*  --  32.1*  --   --  31.4*  --   PLT 196 174  --  156  --   --  159  --   HEPARINUNFRC  --   --   < > 0.94*  < > 0.79* 0.52 0.41  CREATININE 1.09* 1.11*  --   --   --   --  0.91  --   < > = values in this interval not displayed.  Estimated Creatinine Clearance: 35.7 mL/min (by C-G formula based on Cr of 0.91).   Assessment: 81 y.o. female presents with SOB and atrial fibrillation. Noted remote h/o afib in 2012 - not on AC PTA. Noted family does not want aggressive measure and palliative care consulted  -heparin level is 0.41 and confirmed at goal  Goal of Therapy:  Heparin level 0.3-0.7 units/ml Monitor platelets by anticoagulation protocol: Yes   Plan:  -No heparin changes needed -Daily CBC/HL -Monitor for bleeding  Hildred Laser, Pharm D 11/02/2014 12:23 PM

## 2014-11-02 NOTE — Progress Notes (Signed)
Pulled out iv line to right arm, mittens applied. Iv team aware and inserted gauge 24 to right ac, wapped with kling. Continue to monitor.

## 2014-11-02 NOTE — Progress Notes (Addendum)
Heart rate went up to 200 a-fib, , became agitated,  Pulled out her iv line where heparin gtt is, despite mitten used. MD aware, morphine iv given  cardizem gtt increased to 20 ml/hr, bp 130/68.  cardizem d/ced and started on amiodarone gtt at 6:30 pm with bolus 150 given. Heart on 140's a-fib, continue to monitor.

## 2014-11-02 NOTE — Progress Notes (Signed)
Family made aware about the ESBL isolation prec. from the urine, emphasized hand washing and the used of gloves and gowns.Provided family with reading material regarding ESBL.

## 2014-11-02 NOTE — Evaluation (Signed)
Clinical/Bedside Swallow Evaluation Patient Details  Name: Katelyn [name] MRN: [id number] Date of Birth: Sep 06, 1918  Today's Date: 11/02/2014 Time: SLP Start Time (ACUTE ONLY): 4650 SLP Stop Time (ACUTE ONLY): 0843 SLP Time Calculation (min) (ACUTE ONLY): 15 min  Past Medical History:  Past Medical History  Diagnosis Date  . Hypertension   . Hypothyroidism   . Hx of colonic polyps     RLQ mensenteric mass-1.4cmx2.1cm CT 02/20/10 ? Carcinoid  . Hyperlipidemia   . Depression 07-03-99    following death of spouse  . Hiatal hernia   . Clostridium difficile colitis   . Anxiety   . Anemia   . Parkinson disease   . Hx of adenomatous colonic polyps 06/25/01  . GERD (gastroesophageal reflux disease)   . Carcinoid tumor, malignant     09/03/10 resection; 2/9 positive LN  . Atrial fibrillation     peri/post op 08/2010 - resumed NSR  . Cholelithiasis 03-Jul-2011  . History of blood transfusion 1950    x 6 units  . Hard of hearing   . Cancer      BREAST- right and left/ s/p radiation, tamifoxen   Past Surgical History:  Past Surgical History  Procedure Laterality Date  . Abdominal hysterectomy  1969  . Back surgery  1959  . Breast surgery  1995    (R) Lumpectomy  . Mastectomy, partial  03-Jul-2003    left  . Eye surgery      bilateral cataract extraction with IOL,   repair left tear duct  . Colon surgery      resection for carcinoid tumor  . Cholecystectomy  03/25/2012    Procedure: LAPAROSCOPIC CHOLECYSTECTOMY WITH INTRAOPERATIVE CHOLANGIOGRAM;  Surgeon: Edward Jolly, MD;  Location: [facility];  Service: General;  Laterality: N/A;   HPI:  81 y.o. female with history of hypertension, hyperlipidemia, GERD, Parkinsons, hypothyroidism, breast cancer, history of carcinoid status post resection in 2010-07-03, atrial fibrillation admittd after confusion, agitiation and SOB. Per MD note, pt recently treated for UTI. CXR bilateral airspace opacities may reflect pulmonary edema or possibly pneumonia. CT No acute  intracranial finding. No prior ST services found.   Assessment / Plan / Recommendation Clinical Impression  Alertness inadequate during swallow assessment SLP suspects silent aspiration with subtle indications of intolerance including watery eyes and slight increase in respiratory status. Chart states comfort measures if no improvements in status, therefore objective assessment does not seem warranted. When arousal increases, SLP would recommend modified consistencies of Dys 1 (puree) and honey thick liquids. ST will continue to follow.     Aspiration Risk  Severe    Diet Recommendation  (When alertness improves, Dys 1, honey thick)   Medication Administration: Crushed with puree Compensations: Slow rate;Small sips/bites;Check for pocketing    Other  Recommendations Oral Care Recommendations: Oral care BID   Follow Up Recommendations       Frequency and Duration min 1 x/week  1 week   Pertinent Vitals/Pain No indications         Swallow Study           Oral/Motor/Sensory Function Overall Oral Motor/Sensory Function:  (unable to participate at this time)   Ice Chips Ice chips: Impaired Presentation: Spoon Oral Phase Impairments: Reduced labial seal;Reduced lingual movement/coordination Oral Phase Functional Implications: Prolonged oral transit Pharyngeal Phase Impairments: Suspected delayed Swallow   Thin Liquid Thin Liquid: Impaired Presentation: Spoon Oral Phase Impairments: Reduced lingual movement/coordination Pharyngeal  Phase Impairments: Suspected delayed Swallow (watery eyes, slight incr  work of breathing)    Nectar Thick Nectar Thick Liquid: Not tested   Honey Thick Honey Thick Liquid: Not tested   Puree Puree: Not tested   Solid   GO    Solid: Not tested       Houston Siren 11/02/2014,9:42 AM   Orbie Pyo Colvin Caroli.Ed [employer] [phone]

## 2014-11-02 NOTE — Progress Notes (Addendum)
ANTIBIOTIC CONSULT NOTE - FOLLOW UP  Pharmacy Consult for levaquin Indication: PNA/UTI  Allergies  Allergen Reactions  . Lactose Intolerance (Gi) Other (See Comments)    UNKNOWN    Patient Measurements: Height: 5\' 2"  (157.5 cm) Weight: 156 lb 12 oz (71.1 kg) IBW/kg (Calculated) : 50.1  Vital Signs: Temp: 97.6 F (36.4 C) (08/17 0700) Temp Source: Oral (08/17 0700) BP: 134/54 mmHg (08/17 0700) Pulse Rate: 121 (08/17 0400) Intake/Output from previous day: 08/16 0701 - 08/17 0700 In: 226.7 [I.V.:176.7; IV Piggyback:50] Out: 2550 [Urine:2550] Intake/Output from this shift: Total I/O In: 8.5 [I.V.:8.5] Out: -   Labs:  Recent Labs  10/30/14 2049 10/31/14 0231 11/01/14 0256 11/02/14 0236  WBC 11.7* 14.8* 13.2* 11.7*  HGB 11.1* 10.3* 10.5* 10.0*  PLT 196 174 156 159  CREATININE 1.09* 1.11*  --  0.91   Estimated Creatinine Clearance: 35.7 mL/min (by C-G formula based on Cr of 0.91). No results for input(s): VANCOTROUGH, VANCOPEAK, VANCORANDOM, GENTTROUGH, GENTPEAK, GENTRANDOM, TOBRATROUGH, TOBRAPEAK, TOBRARND, AMIKACINPEAK, AMIKACINTROU, AMIKACIN in the last 72 hours.   Microbiology: Recent Results (from the past 720 hour(s))  Urine culture     Status: None (Preliminary result)   Collection Time: 10/30/14  8:45 PM  Result Value Ref Range Status   Specimen Description URINE, CATHETERIZED  Final   Special Requests NONE  Final   Culture >=100,000 COLONIES/mL GRAM NEGATIVE RODS  Final   Report Status PENDING  Incomplete  Blood Culture (routine x 2)     Status: None (Preliminary result)   Collection Time: 10/30/14  8:49 PM  Result Value Ref Range Status   Specimen Description BLOOD LEFT ANTECUBITAL  Final   Special Requests BOTTLES DRAWN AEROBIC AND ANAEROBIC 5CC   Final   Culture NO GROWTH 2 DAYS  Final   Report Status PENDING  Incomplete  Blood Culture (routine x 2)     Status: None (Preliminary result)   Collection Time: 10/30/14  9:01 PM  Result Value Ref Range  Status   Specimen Description BLOOD RIGHT HAND  Final   Special Requests BOTTLES DRAWN AEROBIC AND ANAEROBIC 5CC   Final   Culture NO GROWTH 2 DAYS  Final   Report Status PENDING  Incomplete  MRSA PCR Screening     Status: None   Collection Time: 10/31/14  1:20 AM  Result Value Ref Range Status   MRSA by PCR NEGATIVE NEGATIVE Final    Comment:        The GeneXpert MRSA Assay (FDA approved for NASAL specimens only), is one component of a comprehensive MRSA colonization surveillance program. It is not intended to diagnose MRSA infection nor to guide or monitor treatment for MRSA infections.     Anti-infectives    Start     Dose/Rate Route Frequency Ordered Stop   11/02/14 1200  levofloxacin (LEVAQUIN) IVPB 750 mg     750 mg 100 mL/hr over 90 Minutes Intravenous Every 48 hours 11/02/14 1015     10/30/14 2200  aztreonam (AZACTAM) 500 mg in dextrose 5 % 50 mL IVPB  Status:  Discontinued     500 mg 100 mL/hr over 30 Minutes Intravenous Every 8 hours 10/30/14 2050 11/02/14 1015   10/30/14 2200  vancomycin (VANCOCIN) IVPB 750 mg/150 ml premix  Status:  Discontinued     750 mg 150 mL/hr over 60 Minutes Intravenous Every 24 hours 10/30/14 2131 11/02/14 1015      Assessment: 81 yo [name] with UTI and possible PNA on vancomycin and azactam.  WBC= 11.7 (down), afeb, PCT< 0.1, SCr= 0.91 and CrCl ~ 35.  Urine cx noted with GNR- spoke with Dr. Eliseo Squires and will change antibiotics to levaquin only.   8/17 levaquin>> 8/14 azac>> 8/17 8/14 vanc>>8/17  8/14 urine- GNR (> 100K) 8/14 blood x2- ngtd 8/15: MRSA PCR- neg  Plan:  -Levaquin 750mg  IV q48hr. Will consider change to po soon -Will follow renal function, cultures and clinical progress  Hildred Laser, Pharm D 11/02/2014 10:47 AM  Urine culture came back with ESBL E coli, R to ciprofloxacin.  Organism is only sensitive to gent, nitro, imipenen, and zosyn.  Spoke with Dr. Eliseo Squires and will change patient to Zosyn and f/u clinical course.  If  patient worsens, may need to add additional coverage.   Start Zosyn 3.375 gm IV q8h (4-hr infusion) F/u LOT, cultures, CBC, clinical course  Cassie L. Nicole Kindred, PharmD PGY2 Infectious Diseases Pharmacy Resident Pager: [phone] 11/02/2014 11:41 AM

## 2014-11-02 NOTE — Progress Notes (Signed)
Badly refused bp taking causing her pain and started to  Scream.

## 2014-11-02 NOTE — Progress Notes (Signed)
Initial Nutrition Assessment  DOCUMENTATION CODES:   Not applicable  INTERVENTION:   -RD will follow for diet advancement and supplement diet as appropriate  NUTRITION DIAGNOSIS:   Inadequate oral intake related to inability to eat as evidenced by NPO status.  GOAL:   Patient will meet greater than or equal to 90% of their needs  MONITOR:   PO intake, Supplement acceptance, Labs, Weight trends, Skin, I & O's  REASON FOR ASSESSMENT:   Low Braden    ASSESSMENT:   Katelyn [name] is a 81 y.o. female with history of hypertension, hyperlipidemia, hypothyroidism, breast cancer, history of carcinoid status post resection in 2012 and paroxysmal atrial fibrillation not on anticoagulation was brought to the ER after patient became confused and agitated and short of breath. As per the patient's daughter with whom I discussed patient was found to be confused over the last 2 days and was treated with antibiotics for UTI. Yesterday afternoon patient became acutely confused and agitated and short of breath and was brought to the ER. ER patient was found to be in A. fib with RVR and chest x-ray showed bilateral infiltrates concerning for CHF versus pneumonia. X-ray also showed bilateral pleural effusion.  Pt admitted with acute respiratory failure with hypoxia.   Pt lethargic at time of visit. Nutrition-focused physical exam deferred at this time.   Spoke with RN. She reports pt has failed swallow evaluation due to lethargy and will be NPO until further notice. Reviewed SLP note; pt is at severe aspiration risk and recommending Dysphagia 1 diet with honey thick liquids when pt is more alert.   Per RN, palliative care has been consulted and pt family is leaning towards comfort care.  Labs reviewed: Na: 131.   Diet Order:  DIET - DYS 1 Room service appropriate?: Yes; Fluid consistency:: Honey Thick  Skin:  Reviewed, no issues  Last BM:  11/02/14  Height:   Ht Readings from Last 1  Encounters:  10/31/14 5\' 2"  (1.575 m)    Weight:   Wt Readings from Last 1 Encounters:  11/02/14 156 lb 12 oz (71.1 kg)    Ideal Body Weight:  50 kg  BMI:  Body mass index is 28.66 kg/(m^2).  Estimated Nutritional Needs:   Kcal:  1600-1800  Protein:  75-85 grams  Fluid:  1.6-1.8 L  EDUCATION NEEDS:   No education needs identified at this time  Katelyn [name] A. Jimmye Norman, RD, LDN, CDE Pager: [phone] After hours Pager: [phone]

## 2014-11-02 NOTE — Progress Notes (Signed)
PROGRESS NOTE  Katelyn [name] RXV:[id number] DOB: 1918/06/03 DOA: 10/30/2014 PCP: Gwendolyn Grant, MD  Assessment/Plan: Acute respiratory failure with hypoxia - differential includes pulm edema due to a fib versus pneumonia.  -For possible pneumonia patient has been placed on empiric antibiotics.  pro-calcitonin levels low Patient's family at this time has not requested any aggressive measures.  -gave 1 dose of IV lasix with good results  A. fib with RVR -  -heparin gtt -as BP has now improved, will give IV BB until patient passed swallow evaluation and then restart home BB - if difficult to control, will get cardiology consult  UTI - gram negative rods -abx  Acute encephalopathy- patient waking up-able to tell me she has PNA  Hypothyroidism on Synthroid.  Hypertension presently blood pressure is in the low normals.  History of breast cancer on anastrozole.  History of carcinoid status post resection in 2012.  Hyponatremia -? Volume overload- improved with diuresis  Code Status: DNR Family Communication: daughter on phone Disposition Plan:    Consultants:  Palliative care  Procedures:      HPI/Subjective: Wakes up, hard of hearing but told me she had pneumonia   Objective: Filed Vitals:   11/02/14 0700  BP: 134/54  Pulse:   Temp: 97.6 F (36.4 C)  Resp: 18    Intake/Output Summary (Last 24 hours) at 11/02/14 0807 Last data filed at 11/02/14 0700  Gross per 24 hour  Intake 259.65 ml  Output   2275 ml  Net -2015.35 ml   Filed Weights   10/31/14 0225 11/01/14 0300 11/02/14 0423  Weight: 74.1 kg (163 lb 5.8 oz) 71.4 kg (157 lb 6.5 oz) 71.1 kg (156 lb 12 oz)    Exam:   General:  Elderly, NAD- on 4L [place]  Cardiovascular: irr-fast  Respiratory: coarse, diminshed  Abdomen: +BS, soft  Musculoskeletal: + LE edema   Data Reviewed: Basic Metabolic Panel:  Recent Labs Lab 10/30/14 2049 10/31/14 0231 11/02/14 0236  NA 122* 125* 131*  K  3.7 3.8 4.1  CL 89* 92* 99*  CO2 20* 25 22  GLUCOSE 152* 85 104*  BUN 23* 25* 33*  CREATININE 1.09* 1.11* 0.91  CALCIUM 8.6* 8.3* 8.2*   Liver Function Tests:  Recent Labs Lab 10/30/14 2049 10/31/14 0231  AST 35 32  ALT 43 38  ALKPHOS 97 84  BILITOT 0.6 0.6  PROT 6.9 5.8*  ALBUMIN 3.4* 2.9*   No results for input(s): LIPASE, AMYLASE in the last 168 hours. No results for input(s): AMMONIA in the last 168 hours. CBC:  Recent Labs Lab 10/30/14 2049 10/31/14 0231 11/01/14 0256 11/02/14 0236  WBC 11.7* 14.8* 13.2* 11.7*  NEUTROABS 9.9* 11.7*  --   --   HGB 11.1* 10.3* 10.5* 10.0*  HCT 33.6* 31.3* 32.1* 31.4*  MCV 87.5 88.2 88.2 89.7  PLT 196 174 156 159   Cardiac Enzymes: No results for input(s): CKTOTAL, CKMB, CKMBINDEX, TROPONINI in the last 168 hours. BNP (last 3 results)  Recent Labs  10/30/14 2232  BNP 743.7*    ProBNP (last 3 results) No results for input(s): PROBNP in the last 8760 hours.  CBG:  Recent Labs Lab 10/31/14 1154 10/31/14 2353 11/01/14 0633 11/01/14 1751 11/02/14 0038  GLUCAP 73 88 74 115* 115*    Recent Results (from the past 240 hour(s))  Urine culture     Status: None (Preliminary result)   Collection Time: 10/30/14  8:45 PM  Result Value Ref Range Status   Specimen  Description URINE, CATHETERIZED  Final   Special Requests NONE  Final   Culture >=100,000 COLONIES/mL GRAM NEGATIVE RODS  Final   Report Status PENDING  Incomplete  Blood Culture (routine x 2)     Status: None (Preliminary result)   Collection Time: 10/30/14  8:49 PM  Result Value Ref Range Status   Specimen Description BLOOD LEFT ANTECUBITAL  Final   Special Requests BOTTLES DRAWN AEROBIC AND ANAEROBIC 5CC   Final   Culture NO GROWTH 2 DAYS  Final   Report Status PENDING  Incomplete  Blood Culture (routine x 2)     Status: None (Preliminary result)   Collection Time: 10/30/14  9:01 PM  Result Value Ref Range Status   Specimen Description BLOOD RIGHT HAND   Final   Special Requests BOTTLES DRAWN AEROBIC AND ANAEROBIC 5CC   Final   Culture NO GROWTH 2 DAYS  Final   Report Status PENDING  Incomplete  MRSA PCR Screening     Status: None   Collection Time: 10/31/14  1:20 AM  Result Value Ref Range Status   MRSA by PCR NEGATIVE NEGATIVE Final    Comment:        The GeneXpert MRSA Assay (FDA approved for NASAL specimens only), is one component of a comprehensive MRSA colonization surveillance program. It is not intended to diagnose MRSA infection nor to guide or monitor treatment for MRSA infections.      Studies: No results found.  Scheduled Meds: . aztreonam  500 mg Intravenous Q8H  . levothyroxine  50 mcg Intravenous Daily  . metoprolol  5 mg Intravenous 4 times per day  . sodium chloride  3 mL Intravenous Q12H  . vancomycin  750 mg Intravenous Q24H   Continuous Infusions: . heparin 850 Units/hr (11/02/14 0530)   Antibiotics Given (last 72 hours)    Date/Time Action Medication Dose Rate   10/30/14 2158 Given   vancomycin (VANCOCIN) IVPB 750 mg/150 ml premix 750 mg 150 mL/hr   10/31/14 2111 Given   vancomycin (VANCOCIN) IVPB 750 mg/150 ml premix 750 mg 150 mL/hr   11/01/14 2219 Given   vancomycin (VANCOCIN) IVPB 750 mg/150 ml premix 750 mg 150 mL/hr      Principal Problem:   Acute respiratory failure with hypoxia Active Problems:   Hypothyroidism   Essential hypertension   Congestive heart disease   Atrial fibrillation with RVR   Acute respiratory failure   Acute encephalopathy   Palliative care encounter   DNR (do not resuscitate) discussion    Time spent: 37 min    Maresha Anastos, [facility] Pager [phone]. If 7PM-7AM, please contact night-coverage at [email or web address], password [facility] 11/02/2014, 8:07 AM  LOS: 3 days

## 2014-11-02 NOTE — Progress Notes (Signed)
Patient restless and agitated this afternoon.  HR elevated, did not respond to cardizem-- will try amiodarone gtt- do not plan this long term.  Will try PRN morphine.  Family still not wanting invasive treatment, more conservative but have not come to final decision.  Await pallaitive care meeting in AM [facility]

## 2014-11-02 NOTE — Progress Notes (Signed)
ANTICOAGULATION CONSULT NOTE - Follow Up Consult  Pharmacy Consult for Heparin  Indication: atrial fibrillation  Allergies  Allergen Reactions  . Lactose Intolerance (Gi) Other (See Comments)    UNKNOWN    Patient Measurements: Height: 5\' 2"  (157.5 cm) Weight: 157 lb 6.5 oz (71.4 kg) IBW/kg (Calculated) : 50.1  Vital Signs: Temp: 97.6 F (36.4 C) (08/17 0036) Temp Source: Oral (08/17 0036) BP: 105/86 mmHg (08/17 0200) Pulse Rate: 136 (08/17 0200)  Labs:  Recent Labs  10/30/14 2049 10/31/14 0231  11/01/14 0256 11/01/14 1230 11/01/14 2058 11/02/14 0236  HGB 11.1* 10.3*  --  10.5*  --   --  10.0*  HCT 33.6* 31.3*  --  32.1*  --   --  31.4*  PLT 196 174  --  156  --   --  159  HEPARINUNFRC  --   --   < > 0.94* 0.60 0.79* 0.52  CREATININE 1.09* 1.11*  --   --   --   --   --   < > = values in this interval not displayed.  Estimated Creatinine Clearance: 29.3 mL/min (by C-G formula based on Cr of 1.11).   Assessment: Therapeutic heparin level x 1 after rate decrease.   Goal of Therapy:  Heparin level 0.3-0.7 units/ml Monitor platelets by anticoagulation protocol: Yes   Plan:  -Continue heparin at 850 units/hr -1100 HL to confirm -Daily CBC/HL -Monitor for bleeding  Narda Bonds 11/02/2014,3:30 AM

## 2014-11-03 DIAGNOSIS — R0600 Dyspnea, unspecified: Secondary | ICD-10-CM

## 2014-11-03 LAB — CBC
HEMATOCRIT: 29.5 % — AB (ref 36.0–46.0)
HEMOGLOBIN: 9.2 g/dL — AB (ref 12.0–15.0)
MCH: 28.5 pg (ref 26.0–34.0)
MCHC: 31.2 g/dL (ref 30.0–36.0)
MCV: 91.3 fL (ref 78.0–100.0)
Platelets: 176 10*3/uL (ref 150–400)
RBC: 3.23 MIL/uL — ABNORMAL LOW (ref 3.87–5.11)
RDW: 15.5 % (ref 11.5–15.5)
WBC: 17.8 10*3/uL — ABNORMAL HIGH (ref 4.0–10.5)

## 2014-11-03 LAB — GLUCOSE, CAPILLARY
Glucose-Capillary: 118 mg/dL — ABNORMAL HIGH (ref 65–99)
Glucose-Capillary: 132 mg/dL — ABNORMAL HIGH (ref 65–99)
Glucose-Capillary: 133 mg/dL — ABNORMAL HIGH (ref 65–99)

## 2014-11-03 LAB — HEPARIN LEVEL (UNFRACTIONATED): HEPARIN UNFRACTIONATED: 0.32 [IU]/mL (ref 0.30–0.70)

## 2014-11-03 MED ORDER — LORAZEPAM 2 MG/ML IJ SOLN
1.0000 mg | INTRAMUSCULAR | Status: DC | PRN
  Administered 2014-11-03 (×3): 1 mg via INTRAVENOUS
  Filled 2014-11-03 (×3): qty 1

## 2014-11-03 MED ORDER — AMIODARONE HCL IN DEXTROSE 360-4.14 MG/200ML-% IV SOLN
30.0000 mg/h | INTRAVENOUS | Status: DC
  Filled 2014-11-03: qty 200

## 2014-11-03 MED ORDER — MORPHINE SULFATE (PF) 2 MG/ML IV SOLN
1.0000 mg | INTRAVENOUS | Status: DC | PRN
  Administered 2014-11-03: 1 mg via INTRAVENOUS
  Filled 2014-11-03: qty 1

## 2014-11-03 MED ORDER — MORPHINE SULFATE (CONCENTRATE) 10 MG/0.5ML PO SOLN
5.0000 mg | ORAL | Status: DC | PRN
  Administered 2014-11-03 – 2014-11-04 (×5): 5 mg via ORAL
  Filled 2014-11-03 (×5): qty 0.5

## 2014-11-03 NOTE — Clinical Social Work Note (Signed)
Clinical Social Work Assessment  Patient Details  Name: Katelyn [name] MRN: [id number] Date of Birth: Jan 10, 1919  Date of referral:  11/03/14               Reason for consult:  Facility Placement, End of Life/Hospice                Permission sought to share information with:  Chartered certified accountant granted to share information::  Yes, Verbal Permission Granted  Name::        Agency::   ([facility])  Relationship::     Contact Information:     Housing/Transportation Living arrangements for the past 2 months:  [place] of Information:  Adult Children Patient Interpreter Needed:  None Criminal Activity/Legal Involvement Pertinent to Current Situation/Hospitalization:  No - Comment as needed Significant Relationships:  Adult Children Lives with:  Facility Resident Do you feel safe going back to the place where you live?  No Need for family participation in patient care:  Yes (Comment)  Care giving concerns:  CSW received consult notifying that family has met with palliative team and decided on comfort care at residential hospice facility   Social Worker assessment / plan:  CSW spoke with pt daughters Katelyn [name] and Katelyn [name] at pt bedside. Pt sleeping through conversation. Pt daughters both confirmed wishes for residential hospice. They informed CSW of meaningful discussion had with palliative care team. They would prefer pt dc to [facility] and if this is not possible are open to referral being made to [facility] as well. Family informed CSW they have not spoken with [facility]. CSW informed family that [facility] will notify facility of updated plan for pt. Family requesting CSW notify facility that they will try to clear pt room this weekend. Family is hoping that pt is able to be transferred to residential hospice facility today.  Employment status:  Retired Forensic scientist:  Medicare PT  Recommendations:  Not assessed at this time [place] / Referral to community resources:  Other (Comment Required) ([place])  Patient/Family's Response to care:  Pt family confirmed they have decided on residential hospice facility placement for pt  Patient/Family's Understanding of and Emotional Response to Diagnosis, Current Treatment, and Prognosis:  Pt daughters both have a good understanding of pt condition. Pt daughters both soft spoken during conversation. They expressed some grief over decision but do feel this is what will be best for pt.  Emotional Assessment Appearance:  Appears stated age, Well-Groomed Attitude/Demeanor/Rapport:  Unable to Assess Affect (typically observed):  Unable to Assess Orientation:  Oriented to Self Alcohol / Substance use:  Not Applicable Psych involvement (Current and /or in the community):  No (Comment)  Discharge Needs  Concerns to be addressed:  Discharge Planning Concerns, Grief and Loss Concerns Readmission within the last 30 days:  No Current discharge risk:  Terminally ill Barriers to Discharge:  Other (Awaiting review from hospice facilities)   [place], [place]

## 2014-11-03 NOTE — Progress Notes (Signed)
Chaplain responded to spiritual care consult for end of life. Chaplain introduced herself to pt's two daughters, [name] and Wannetta Sender. Chaplain facilitated storytelling, offered emotional support, and offered prayer. Pt's pastor should be following up with family. Chaplain informed pt family of her continued services as needed. Page chaplain as needed.   11/03/14 1200  Clinical Encounter Type  Visited With Family  Visit Type Initial;Spiritual support  Referral From Palliative care team  Consult/Referral To Faith community  Spiritual Encounters  Spiritual Needs Grief support;Emotional;Prayer  Stress Factors  Family Stress Factors Loss  Rolly Salter, Chaplain 11/03/2014 12:05 PM

## 2014-11-03 NOTE — Progress Notes (Signed)
PROGRESS NOTE  Katelyn [name] DQQ:[id number] DOB: 10/23/1918 DOA: 10/30/2014 PCP: Gwendolyn Grant, MD  Assessment/Plan: Acute respiratory failure with hypoxia - differential includes pulm edema due to a fib versus pneumonia.  -comfort focus -d/c abx  A. fib with RVR -  D/c meds as now comfort focus  UTI - gram negative rods -d/c abx  Hypothyroidism on Synthroid.  Hypertension presently blood pressure is in the low normals.  History of breast cancer on anastrozole.  History of carcinoid status post resection in 2012.  Hyponatremia -comfort focus  tx to med surge bed   Code Status: DNR Family Communication: daughter Disposition Plan:    Consultants:  Palliative care  Procedures:      HPI/Subjective: Resting eyes closed   Objective: Filed Vitals:   11/03/14 1300  BP:   Pulse: 136  Temp:   Resp: 19    Intake/Output Summary (Last 24 hours) at 11/03/14 1315 Last data filed at 11/03/14 1204  Gross per 24 hour  Intake    702 ml  Output   1615 ml  Net   -913 ml   Filed Weights   11/01/14 0300 11/02/14 0423 11/03/14 0500  Weight: 71.4 kg (157 lb 6.5 oz) 71.1 kg (156 lb 12 oz) 68.2 kg (150 lb 5.7 oz)    Exam:   General:  Elderly, NAD- resting with eyes closed- unresponsive  Cardiovascular: rrr  Respiratory: coarse, diminshed   Data Reviewed: Basic Metabolic Panel:  Recent Labs Lab 10/30/14 2049 10/31/14 0231 11/02/14 0236  NA 122* 125* 131*  K 3.7 3.8 4.1  CL 89* 92* 99*  CO2 20* 25 22  GLUCOSE 152* 85 104*  BUN 23* 25* 33*  CREATININE 1.09* 1.11* 0.91  CALCIUM 8.6* 8.3* 8.2*   Liver Function Tests:  Recent Labs Lab 10/30/14 2049 10/31/14 0231  AST 35 32  ALT 43 38  ALKPHOS 97 84  BILITOT 0.6 0.6  PROT 6.9 5.8*  ALBUMIN 3.4* 2.9*   No results for input(s): LIPASE, AMYLASE in the last 168 hours. No results for input(s): AMMONIA in the last 168 hours. CBC:  Recent Labs Lab 10/30/14 2049 10/31/14 0231  11/01/14 0256 11/02/14 0236 11/03/14 0304  WBC 11.7* 14.8* 13.2* 11.7* 17.8*  NEUTROABS 9.9* 11.7*  --   --   --   HGB 11.1* 10.3* 10.5* 10.0* 9.2*  HCT 33.6* 31.3* 32.1* 31.4* 29.5*  MCV 87.5 88.2 88.2 89.7 91.3  PLT 196 174 156 159 176   Cardiac Enzymes: No results for input(s): CKTOTAL, CKMB, CKMBINDEX, TROPONINI in the last 168 hours. BNP (last 3 results)  Recent Labs  10/30/14 2232  BNP 743.7*    ProBNP (last 3 results) No results for input(s): PROBNP in the last 8760 hours.  CBG:  Recent Labs Lab 11/02/14 1358 11/02/14 1822 11/03/14 0031 11/03/14 0553 11/03/14 1138  GLUCAP 122* 151* 133* 118* 132*    Recent Results (from the past 240 hour(s))  Urine culture     Status: None   Collection Time: 10/30/14  8:45 PM  Result Value Ref Range Status   Specimen Description URINE, CATHETERIZED  Final   Special Requests NONE  Final   Culture   Final    >=100,000 COLONIES/mL ESCHERICHIA COLI Confirmed Extended Spectrum Beta-Lactamase Producer (ESBL) 90,000 COLONIES/ml ESCHERICHIA COLI    Report Status 11/02/2014 FINAL  Final   Organism ID, Bacteria ESCHERICHIA COLI  Final   Organism ID, Bacteria ESCHERICHIA COLI  Final      Susceptibility  Escherichia coli - MIC*    AMPICILLIN >=32 RESISTANT Resistant     CEFAZOLIN >=64 RESISTANT Resistant     CEFTRIAXONE >=64 RESISTANT Resistant     CIPROFLOXACIN >=4 RESISTANT Resistant     GENTAMICIN <=1 SENSITIVE Sensitive     IMIPENEM <=0.25 SENSITIVE Sensitive     NITROFURANTOIN <=16 SENSITIVE Sensitive     TRIMETH/SULFA >=320 RESISTANT Resistant     AMPICILLIN/SULBACTAM 16 INTERMEDIATE Intermediate     PIP/TAZO 16 SENSITIVE Sensitive    Escherichia coli - MIC*    AMPICILLIN 16 INTERMEDIATE Intermediate     CEFAZOLIN <=4 SENSITIVE Sensitive     CEFTRIAXONE <=1 SENSITIVE Sensitive     CIPROFLOXACIN >=4 RESISTANT Resistant     GENTAMICIN <=1 SENSITIVE Sensitive     IMIPENEM <=0.25 SENSITIVE Sensitive      NITROFURANTOIN <=16 SENSITIVE Sensitive     TRIMETH/SULFA >=320 RESISTANT Resistant     AMPICILLIN/SULBACTAM 8 SENSITIVE Sensitive     * >=100,000 COLONIES/mL ESCHERICHIA COLI    90,000 COLONIES/ml ESCHERICHIA COLI  Blood Culture (routine x 2)     Status: None (Preliminary result)   Collection Time: 10/30/14  8:49 PM  Result Value Ref Range Status   Specimen Description BLOOD LEFT ANTECUBITAL  Final   Special Requests BOTTLES DRAWN AEROBIC AND ANAEROBIC 5CC   Final   Culture NO GROWTH 3 DAYS  Final   Report Status PENDING  Incomplete  Blood Culture (routine x 2)     Status: None (Preliminary result)   Collection Time: 10/30/14  9:01 PM  Result Value Ref Range Status   Specimen Description BLOOD RIGHT HAND  Final   Special Requests BOTTLES DRAWN AEROBIC AND ANAEROBIC 5CC   Final   Culture NO GROWTH 3 DAYS  Final   Report Status PENDING  Incomplete  MRSA PCR Screening     Status: None   Collection Time: 10/31/14  1:20 AM  Result Value Ref Range Status   MRSA by PCR NEGATIVE NEGATIVE Final    Comment:        The GeneXpert MRSA Assay (FDA approved for NASAL specimens only), is one component of a comprehensive MRSA colonization surveillance program. It is not intended to diagnose MRSA infection nor to guide or monitor treatment for MRSA infections.      Studies: No results found.  Scheduled Meds: . levothyroxine  50 mcg Intravenous Daily  . sodium chloride  3 mL Intravenous Q12H   Continuous Infusions: . amiodarone Stopped (11/03/14 1204)   Antibiotics Given (last 72 hours)    Date/Time Action Medication Dose Rate   10/31/14 2111 Given   vancomycin (VANCOCIN) IVPB 750 mg/150 ml premix 750 mg 150 mL/hr   11/01/14 2219 Given   vancomycin (VANCOCIN) IVPB 750 mg/150 ml premix 750 mg 150 mL/hr   11/02/14 1415 Given   piperacillin-tazobactam (ZOSYN) IVPB 3.375 g 3.375 g 12.5 mL/hr   11/02/14 2222 Given   piperacillin-tazobactam (ZOSYN) IVPB 3.375 g 3.375 g 12.5 mL/hr    11/03/14 0554 Given   piperacillin-tazobactam (ZOSYN) IVPB 3.375 g 3.375 g 12.5 mL/hr      Principal Problem:   Acute respiratory failure with hypoxia Active Problems:   Hypothyroidism   Essential hypertension   Congestive heart disease   Atrial fibrillation with RVR   Acute respiratory failure   Acute encephalopathy   Palliative care encounter   DNR (do not resuscitate) discussion    Time spent: 25 min    Rumaisa Schnetzer, [facility] Pager [phone]. If  7PM-7AM, please contact night-coverage at [email or web address], password [facility] 11/03/2014, 1:15 PM  LOS: 4 days

## 2014-11-03 NOTE — Care Management Important Message (Addendum)
Important Message  Patient Details  Name: Katelyn [name] MRN: [id number] Date of Birth: Jan 23, 1919   Medicare Important Message Given:  Yes-second notification given    Lacretia Leigh, RN 11/03/2014, 10:46 AM

## 2014-11-03 NOTE — Progress Notes (Signed)
ANTICOAGULATION CONSULT NOTE - Follow Up Consult  Pharmacy Consult for Heparin  Indication: atrial fibrillation  Allergies  Allergen Reactions  . Lactose Intolerance (Gi) Other (See Comments)    UNKNOWN    Patient Measurements: Height: 5\' 2"  (157.5 cm) Weight: 150 lb 5.7 oz (68.2 kg) IBW/kg (Calculated) : 50.1  Vital Signs: Temp: 96.9 F (36.1 C) (08/18 0836) Temp Source: Axillary (08/18 0836) BP: 129/65 mmHg (08/18 0836) Pulse Rate: 127 (08/18 0836)  Labs:  Recent Labs  11/01/14 0256  11/02/14 0236 11/02/14 1035 11/03/14 0304  HGB 10.5*  --  10.0*  --  9.2*  HCT 32.1*  --  31.4*  --  29.5*  PLT 156  --  159  --  176  HEPARINUNFRC 0.94*  < > 0.52 0.41 0.32  CREATININE  --   --  0.91  --   --   < > = values in this interval not displayed.  Estimated Creatinine Clearance: 34.9 mL/min (by C-G formula based on Cr of 0.91).   Assessment: 81 y.o. female presents with SOB and atrial fibrillation. Noted remote h/o afib in 2012 - not on AC PTA. Noted family does not want aggressive measure and palliative care consulted (noted for meeting today) . The heparin infusion was noted to be off last pm due loss of IV access.  -heparin level is 0.32 and at goal  Goal of Therapy:  Heparin level 0.3-0.7 units/ml Monitor platelets by anticoagulation protocol: Yes   Plan:  -No heparin changes needed -Daily CBC/HL -Monitor for bleeding  Hildred Laser, Pharm D 11/03/2014 8:41 AM

## 2014-11-03 NOTE — Progress Notes (Signed)
Daily Progress Note   Patient Name: Katelyn [name]       Date: 11/03/2014 DOB: May 16, 1918  Age: 81 y.o. MRN#: [id number] Attending Physician: Geradine Girt, DO Primary Care Physician: Gwendolyn Grant, MD Admit Date: 10/30/2014  Reason for Consultation/Follow-up: Disposition, Establishing goals of care, Non pain symptom management, Pain control and Psychosocial/spiritual support  Subjective:      Continued conversation with family (daughters [name] and [name] and brother) to discuss diagnosis prognosis, GOC, EOL wishes disposition and options.   A discussion was had today regarding advanced directives.  Concepts specific to code status, artifical feeding and hydration, continued IV antibiotics and rehospitalization was had.  The difference between a aggressive medical intervention path  and a palliative comfort care path for this patient at this time was had.  Values and goals of care important to patient and family were attempted to be elicited.  Concept of Hospice was discussed as it relates to a free-standing hospice facility  Natural trajectory and expectations at EOL were discussed.  Questions and concerns addressed.  PMT will continue to support holistically.  Family verbalized their understanding that the right course of treatment  for this patient at this time is comfort and dignity, will begin transition.   Length of Stay: 4 days  Current Medications: Scheduled Meds:  . levothyroxine  50 mcg Intravenous Daily  . sodium chloride  3 mL Intravenous Q12H    Continuous Infusions: . amiodarone 15 mg/hr (11/03/14 0930)    PRN Meds: acetaminophen **OR** acetaminophen, haloperidol lactate, LORazepam, morphine injection, morphine CONCENTRATE, ondansetron **OR** ondansetron (ZOFRAN) IV  Palliative Performance Scale: 20 %     Vital Signs: BP 129/65 mmHg  Pulse 127  Temp(Src) 96.9 F (36.1 C) (Axillary)  Resp 16  Ht 5\' 2"  (1.575 m)  Wt 68.2 kg (150 lb 5.7 oz)  BMI  27.49 kg/m2  SpO2 100% SpO2: SpO2: 100 % O2 Device: O2 Device: Nasal Cannula O2 Flow Rate: O2 Flow Rate (L/min): 4 L/min  Intake/output summary:  Intake/Output Summary (Last 24 hours) at 11/03/14 0918 Last data filed at 11/03/14 0800  Gross per 24 hour  Intake  685.9 ml  Output   1495 ml  Net -809.1 ml   LBM: Last BM Date: 11/02/14 Baseline Weight: Weight: 69.4 kg (153 lb) Most recent weight: Weight: 68.2 kg (150 lb 5.7 oz)  Physical Exam:             General: ill appearing, NAD, minimally responsive CVS: tachycardic Resp:   Neuro: minimally responsive to gentle touch and verbal stimuli  Additional Data Reviewed: Recent Labs     11/02/14  0236  11/03/14  0304  WBC  11.7*  17.8*  HGB  10.0*  9.2*  PLT  159  176  NA  131*   --   BUN  33*   --   CREATININE  0.91   --      Problem List:  Patient Active Problem List   Diagnosis Date Noted  . Acute respiratory failure with hypoxia 10/31/2014  . Congestive heart disease 10/31/2014  . Atrial fibrillation with RVR 10/31/2014  . Acute respiratory failure 10/31/2014  . Palliative care encounter 10/31/2014  . DNR (do not resuscitate) discussion 10/31/2014  . Acute encephalopathy   . A-fib 10/30/2014  . Breast cancer of upper-inner quadrant of right female breast 12/08/2012  . Breast cancer of lower-outer quadrant of left female breast 12/08/2012  . Suprapubic pain 11/26/2012  . Painful bladder spasm  11/26/2012  . Urinary frequency 08/14/2012  . Carcinoid tumor of small intestine, malignant 05/31/2011  . ANEMIA 05/02/2010  . ANXIETY 05/02/2010  . DEPRESSION 05/02/2010  . PARKINSON'S DISEASE 05/02/2010  . Diarrhea 05/02/2010  . COLONIC POLYPS, ADENOMATOUS, HX OF 05/02/2010  . OVERACTIVE BLADDER 04/01/2010  . Malignant neoplasm of female breast 03/07/2010  . HYPERLIPIDEMIA 03/07/2010  . GERD 03/07/2010  . DIVERTICULOSIS, COLON 03/07/2010  . Hypothyroidism 03/06/2010  . Essential hypertension 03/06/2010      Palliative Care Assessment & Plan    Code Status:  DNR   No BiPap  Goals of Care:  Comfort is main focus of care  Desire for further Chaplaincy support: yes  3. Symptom Management:  Dyspnea/Pain:  Morphine 1 mg IV every 1 hr prn  Agitation: Ativan 1 mg IV every 4 hrs prn   5. Prognosis: < 2 weeks  5. Discharge Planning: Hospice facility, will write for choice   Care plan was discussed with Dr Eliseo Squires  Thank you for allowing the Palliative Medicine Team to assist in the care of this patient.   Time In: 0845 Time Out: 0930 Total Time 45 min Prolonged Time Billed  no     Greater than 50%  of this time was spent counseling and coordinating care related to the above assessment and plan.     Knox Royalty, NP  11/03/2014, 9:18 AM  Please contact Palliative Medicine Team phone at [phone] for questions and concerns.

## 2014-11-03 NOTE — Consult Note (Signed)
[facility] Liaison: Received request from [place] for family interest in [facility]. Chart reviewed and met with two daughters at bedside. Both agreeable to transfer tomorrow 11/04/2014. Daughter [name] completed paper work. Dr. Orpah Melter to assume care per family request.   Please fax discharge summary to [phone].   Please arrange transport for before noon if possible.   RN please call report to [phone].  Thank you.  Erling Conte LCSW [phone]

## 2014-11-03 NOTE — Progress Notes (Signed)
Report called to [facility] on 6N

## 2014-11-03 NOTE — Progress Notes (Signed)
Nutrition Brief Note  Chart reviewed. Pt now transitioning to comfort care.  No further nutrition interventions warranted at this time.  Please re-consult as needed.   Imoni Kohen A. Cipriana Biller, RD, LDN, CDE Pager: [phone] After hours Pager: [phone]  

## 2014-11-04 ENCOUNTER — Telehealth: Payer: Self-pay | Admitting: *Deleted

## 2014-11-04 DIAGNOSIS — J9600 Acute respiratory failure, unspecified whether with hypoxia or hypercapnia: Secondary | ICD-10-CM

## 2014-11-04 LAB — CULTURE, BLOOD (ROUTINE X 2)
CULTURE: NO GROWTH
CULTURE: NO GROWTH

## 2014-11-04 MED ORDER — MORPHINE SULFATE (CONCENTRATE) 10 MG/0.5ML PO SOLN: 5.0000 mg | ORAL | Status: DC | PRN

## 2014-11-04 NOTE — Telephone Encounter (Signed)
Pt was on tcm list d/c 11/04/14 was sent to [facility]. Did not make tcm appt with pcp...Katelyn [name]

## 2014-11-04 NOTE — Progress Notes (Signed)
Patient discharged to [facility] reported to receiving nurse.

## 2014-11-04 NOTE — Clinical Social Work Note (Signed)
CSW scheduled transport for patient to go to [facility].  Patient's daughter [name] informed and is at bedside.  Patient awaiting transfer to [facility], discharge summary has been faxed to [facility].  [name] Broom. [place], MSW, [place] 11/04/2014 10:32 AM

## 2014-11-04 NOTE — Discharge Summary (Signed)
Physician Discharge Summary  XIN KLAWITTER WJX:[id number] DOB: [birth year] DOA: 10/30/2014  PCP: Gwendolyn Grant, MD  Admit date: 10/30/2014 Discharge date: 11/04/2014  Time spent: 35 minutes  Recommendations for Outpatient Follow-up:  1. To [facility] place  Discharge Diagnoses:  Principal Problem:   Acute respiratory failure with hypoxia Active Problems:   Hypothyroidism   Essential hypertension   Congestive heart disease   Atrial fibrillation with RVR   Acute respiratory failure   Acute encephalopathy   Palliative care encounter   DNR (do not resuscitate) discussion   Dyspnea   Discharge Condition: terminal  Diet recommendation:   Filed Weights   11/01/14 0300 11/02/14 0423 11/03/14 0500  Weight: 71.4 kg (157 lb 6.5 oz) 71.1 kg (156 lb 12 oz) 68.2 kg (150 lb 5.7 oz)    History of present illness:  Katelyn [name] is a 81 y.o. female with history of hypertension, hyperlipidemia, hypothyroidism, breast cancer, history of carcinoid status post resection in 2012 and paroxysmal atrial fibrillation not on anticoagulation was brought to the ER after patient became confused and agitated and short of breath. As per the patient's daughter with whom I discussed patient was found to be confused over the last 2 days and was treated with antibiotics for UTI. Yesterday afternoon patient became acutely confused and agitated and short of breath and was brought to the ER. ER patient was found to be in A. fib with RVR and chest x-ray showed bilateral infiltrates concerning for CHF versus pneumonia. X-ray also showed bilateral pleural effusion. Patient was hypoxic and was placed on BiPAP. Due to agitation patient was given morphine for which patient became sedated. Patient was started on empiric antibiotics and is now about to be started on Cardizem infusion for A. fib but patient's heart rate improved. Patient has been admitted for acute respiratory failure and further management. At this time patient  has DO NOT RESUSCITATE and family has requested no aggressive measures.    Hospital Course:  Acute respiratory failure with hypoxia - differential includes pulm edema due to a fib versus pneumonia.  -comfort focus -d/c abx  A. fib with RVR -  D/c meds as now comfort focus  UTI - gram negative rods -d/c abx  Hypothyroidism on Synthroid.  Hypertension presently blood pressure is in the low normals.  History of breast cancer on anastrozole.  History of carcinoid status post resection in 2012.  Hyponatremia -comfort focus    Discharge Exam: Filed Vitals:   11/04/14 0621  BP: 157/71  Pulse: 67  Temp: 97.7 F (36.5 C)  Resp: 16    General: resting with eyes closed  Discharge Instructions    Current Discharge Medication List    START taking these medications   Details  Morphine Sulfate (MORPHINE CONCENTRATE) 10 MG/0.5ML SOLN concentrated solution Take 0.25 mLs (5 mg total) by mouth every hour as needed for moderate pain or shortness of breath. Qty: 42 mL      STOP taking these medications     acetaminophen (TYLENOL) 325 MG tablet      amLODipine (NORVASC) 5 MG tablet      anastrozole (ARIMIDEX) 1 MG tablet      calcium citrate-vitamin D (CITRACAL+D) 315-200 MG-UNIT per tablet      Cholecalciferol 1000 UNITS capsule      levothyroxine (SYNTHROID, LEVOTHROID) 50 MCG tablet      metoprolol (LOPRESSOR) 50 MG tablet      nortriptyline (PAMELOR) 10 MG capsule      omeprazole (PRILOSEC)  40 MG capsule      dicyclomine (BENTYL) 10 MG capsule      diphenoxylate-atropine (LOMOTIL) 2.5-0.025 MG per tablet      metoprolol tartrate (LOPRESSOR) 25 MG tablet        Allergies  Allergen Reactions  . Lactose Intolerance (Gi) Other (See Comments)    UNKNOWN      The results of significant diagnostics from this hospitalization (including imaging, microbiology, ancillary and laboratory) are listed below for reference.    Significant Diagnostic  Studies: Ct Head Wo Contrast  10/31/2014   CLINICAL DATA:  Acute encephalopathy.  Unresponsive  EXAM: CT HEAD WITHOUT CONTRAST  TECHNIQUE: Contiguous axial images were obtained from the base of the skull through the vertex without intravenous contrast.  COMPARISON:  09/24/2008  FINDINGS: Skull and Sinuses:Negative for fracture or destructive process. No sinusitis or mastoiditis  Orbits: Bilateral cataract resection. Prominent superior ophthalmic veins, usually transient venous distention.  Brain: No evidence of acute infarction, hemorrhage, hydrocephalus, or mass lesion/mass effect. There is generalized atrophy which has mildly progressed from 2010. Minimal low-density around the lateral ventricles, expected for age.  IMPRESSION: 1. No acute intracranial finding. 2. Senescent findings with expected progression from 2010.   Electronically Signed   By: Monte Fantasia M.D.   On: 10/31/2014 04:49   Dg Chest Port 1 View  10/30/2014   CLINICAL DATA:  Code sepsis. Acute onset of shortness of breath and atrial fibrillation. Initial encounter.  EXAM: PORTABLE CHEST - 1 VIEW  COMPARISON:  Chest radiograph from 03/17/2012  FINDINGS: The lungs are hypoexpanded. Small bilateral pleural effusions are seen, right greater than left. Bilateral airspace opacities may reflect pulmonary edema or possibly pneumonia. There is no evidence of pneumothorax.  The cardiomediastinal silhouette is mildly enlarged. No acute osseous abnormalities are seen.  IMPRESSION: Lungs hypoexpanded. Small bilateral pleural effusions, right greater than left. Bilateral airspace opacities may reflect pulmonary edema or possibly pneumonia. Mild cardiomegaly noted.   Electronically Signed   By: Garald Balding M.D.   On: 10/30/2014 21:19    Microbiology: Recent Results (from the past 240 hour(s))  Urine culture     Status: None   Collection Time: 10/30/14  8:45 PM  Result Value Ref Range Status   Specimen Description URINE, CATHETERIZED  Final    Special Requests NONE  Final   Culture   Final    >=100,000 COLONIES/mL ESCHERICHIA COLI Confirmed Extended Spectrum Beta-Lactamase Producer (ESBL) 90,000 COLONIES/ml ESCHERICHIA COLI    Report Status 11/02/2014 FINAL  Final   Organism ID, Bacteria ESCHERICHIA COLI  Final   Organism ID, Bacteria ESCHERICHIA COLI  Final      Susceptibility   Escherichia coli - MIC*    AMPICILLIN >=32 RESISTANT Resistant     CEFAZOLIN >=64 RESISTANT Resistant     CEFTRIAXONE >=64 RESISTANT Resistant     CIPROFLOXACIN >=4 RESISTANT Resistant     GENTAMICIN <=1 SENSITIVE Sensitive     IMIPENEM <=0.25 SENSITIVE Sensitive     NITROFURANTOIN <=16 SENSITIVE Sensitive     TRIMETH/SULFA >=320 RESISTANT Resistant     AMPICILLIN/SULBACTAM 16 INTERMEDIATE Intermediate     PIP/TAZO 16 SENSITIVE Sensitive    Escherichia coli - MIC*    AMPICILLIN 16 INTERMEDIATE Intermediate     CEFAZOLIN <=4 SENSITIVE Sensitive     CEFTRIAXONE <=1 SENSITIVE Sensitive     CIPROFLOXACIN >=4 RESISTANT Resistant     GENTAMICIN <=1 SENSITIVE Sensitive     IMIPENEM <=0.25 SENSITIVE Sensitive     NITROFURANTOIN <=  16 SENSITIVE Sensitive     TRIMETH/SULFA >=320 RESISTANT Resistant     AMPICILLIN/SULBACTAM 8 SENSITIVE Sensitive     * >=100,000 COLONIES/mL ESCHERICHIA COLI    90,000 COLONIES/ml ESCHERICHIA COLI  Blood Culture (routine x 2)     Status: None (Preliminary result)   Collection Time: 10/30/14  8:49 PM  Result Value Ref Range Status   Specimen Description BLOOD LEFT ANTECUBITAL  Final   Special Requests BOTTLES DRAWN AEROBIC AND ANAEROBIC 5CC   Final   Culture NO GROWTH 4 DAYS  Final   Report Status PENDING  Incomplete  Blood Culture (routine x 2)     Status: None (Preliminary result)   Collection Time: 10/30/14  9:01 PM  Result Value Ref Range Status   Specimen Description BLOOD RIGHT HAND  Final   Special Requests BOTTLES DRAWN AEROBIC AND ANAEROBIC 5CC   Final   Culture NO GROWTH 4 DAYS  Final   Report Status  PENDING  Incomplete  MRSA PCR Screening     Status: None   Collection Time: 10/31/14  1:20 AM  Result Value Ref Range Status   MRSA by PCR NEGATIVE NEGATIVE Final    Comment:        The GeneXpert MRSA Assay (FDA approved for NASAL specimens only), is one component of a comprehensive MRSA colonization surveillance program. It is not intended to diagnose MRSA infection nor to guide or monitor treatment for MRSA infections.      Labs: Basic Metabolic Panel:  Recent Labs Lab 10/30/14 2049 10/31/14 0231 11/02/14 0236  NA 122* 125* 131*  K 3.7 3.8 4.1  CL 89* 92* 99*  CO2 20* 25 22  GLUCOSE 152* 85 104*  BUN 23* 25* 33*  CREATININE 1.09* 1.11* 0.91  CALCIUM 8.6* 8.3* 8.2*   Liver Function Tests:  Recent Labs Lab 10/30/14 2049 10/31/14 0231  AST 35 32  ALT 43 38  ALKPHOS 97 84  BILITOT 0.6 0.6  PROT 6.9 5.8*  ALBUMIN 3.4* 2.9*   No results for input(s): LIPASE, AMYLASE in the last 168 hours. No results for input(s): AMMONIA in the last 168 hours. CBC:  Recent Labs Lab 10/30/14 2049 10/31/14 0231 11/01/14 0256 11/02/14 0236 11/03/14 0304  WBC 11.7* 14.8* 13.2* 11.7* 17.8*  NEUTROABS 9.9* 11.7*  --   --   --   HGB 11.1* 10.3* 10.5* 10.0* 9.2*  HCT 33.6* 31.3* 32.1* 31.4* 29.5*  MCV 87.5 88.2 88.2 89.7 91.3  PLT 196 174 156 159 176   Cardiac Enzymes: No results for input(s): CKTOTAL, CKMB, CKMBINDEX, TROPONINI in the last 168 hours. BNP: BNP (last 3 results)  Recent Labs  10/30/14 2232  BNP 743.7*    ProBNP (last 3 results) No results for input(s): PROBNP in the last 8760 hours.  CBG:  Recent Labs Lab 11/02/14 1358 11/02/14 1822 11/03/14 0031 11/03/14 0553 11/03/14 1138  GLUCAP 122* 151* 133* 118* 132*       Signed:  Mazie Fencl  [facility] 11/04/2014, 7:45 AM

## 2014-11-17 DEATH — deceased

## 2015-01-11 ENCOUNTER — Ambulatory Visit: Payer: MEDICARE | Admitting: Oncology

## 2015-01-17 NOTE — Progress Notes (Signed)
No show

## 2015-01-18 ENCOUNTER — Ambulatory Visit: Payer: MEDICARE | Admitting: Oncology

## 2015-01-18 ENCOUNTER — Telehealth: Payer: Self-pay | Admitting: Oncology

## 2015-01-18 NOTE — Telephone Encounter (Signed)
Had called cell number due to missed appointment and per patients daughter patient passed away 10-Nov-2014 at hospice.
